# Patient Record
Sex: Female | Born: 1970
Health system: Southern US, Community
[De-identification: ages and names within clinical notes are randomized; demographics above are authoritative.]

## PROBLEM LIST (undated history)

## (undated) ENCOUNTER — Ambulatory Visit (HOSPITAL_COMMUNITY): Admission: EM | Disposition: A | Discharge status: Home / Self Care | Payer: Medicaid Other

## (undated) DIAGNOSIS — E785 Hyperlipidemia, unspecified: Secondary | ICD-10-CM

## (undated) DIAGNOSIS — I10 Essential (primary) hypertension: Secondary | ICD-10-CM

## (undated) DIAGNOSIS — D649 Anemia, unspecified: Secondary | ICD-10-CM

## (undated) DIAGNOSIS — Z9289 Personal history of other medical treatment: Secondary | ICD-10-CM

## (undated) DIAGNOSIS — T7840XA Allergy, unspecified, initial encounter: Secondary | ICD-10-CM

## (undated) HISTORY — PX: APPENDECTOMY: SHX54

## (undated) HISTORY — DX: Allergy, unspecified, initial encounter: T78.40XA

## (undated) HISTORY — PX: CHOLECYSTECTOMY OPEN: SUR202

---

## 2009-05-13 ENCOUNTER — Encounter: Admission: RE | Admit: 2009-05-13 | Discharge: 2009-05-13 | Payer: Self-pay | Admitting: Infectious Diseases

## 2010-03-14 ENCOUNTER — Emergency Department (HOSPITAL_COMMUNITY): Admission: EM | Admit: 2010-03-14 | Discharge: 2010-03-14 | Payer: Self-pay | Admitting: Family Medicine

## 2010-07-07 ENCOUNTER — Emergency Department (HOSPITAL_COMMUNITY): Admission: EM | Admit: 2010-07-07 | Discharge: 2010-07-07 | Payer: Self-pay | Admitting: Family Medicine

## 2011-01-26 LAB — POCT I-STAT, CHEM 8
Calcium, Ion: 1.21 mmol/L (ref 1.12–1.32)
Glucose, Bld: 193 mg/dL — ABNORMAL HIGH (ref 70–99)
HCT: 39 % (ref 36.0–46.0)
Hemoglobin: 13.3 g/dL (ref 12.0–15.0)
Potassium: 3.6 mEq/L (ref 3.5–5.1)
TCO2: 23 mmol/L (ref 0–100)

## 2011-03-19 ENCOUNTER — Inpatient Hospital Stay (INDEPENDENT_AMBULATORY_CARE_PROVIDER_SITE_OTHER)
Admission: RE | Admit: 2011-03-19 | Discharge: 2011-03-19 | Disposition: A | Discharge status: Home / Self Care | Payer: Self-pay | Source: Ambulatory Visit | Attending: Emergency Medicine | Admitting: Emergency Medicine

## 2011-03-19 ENCOUNTER — Ambulatory Visit (INDEPENDENT_AMBULATORY_CARE_PROVIDER_SITE_OTHER): Payer: Self-pay

## 2011-03-19 DIAGNOSIS — R05 Cough: Secondary | ICD-10-CM

## 2011-03-19 DIAGNOSIS — R059 Cough, unspecified: Secondary | ICD-10-CM

## 2011-08-23 IMAGING — CR DG CHEST 2V
2 series · 2 of 2 positions shown · non-contrast
Comparison: 05/13/2009

CLINICAL DATA: Cough

CHEST - 2 VIEW

[view not recorded (1 of 2)]
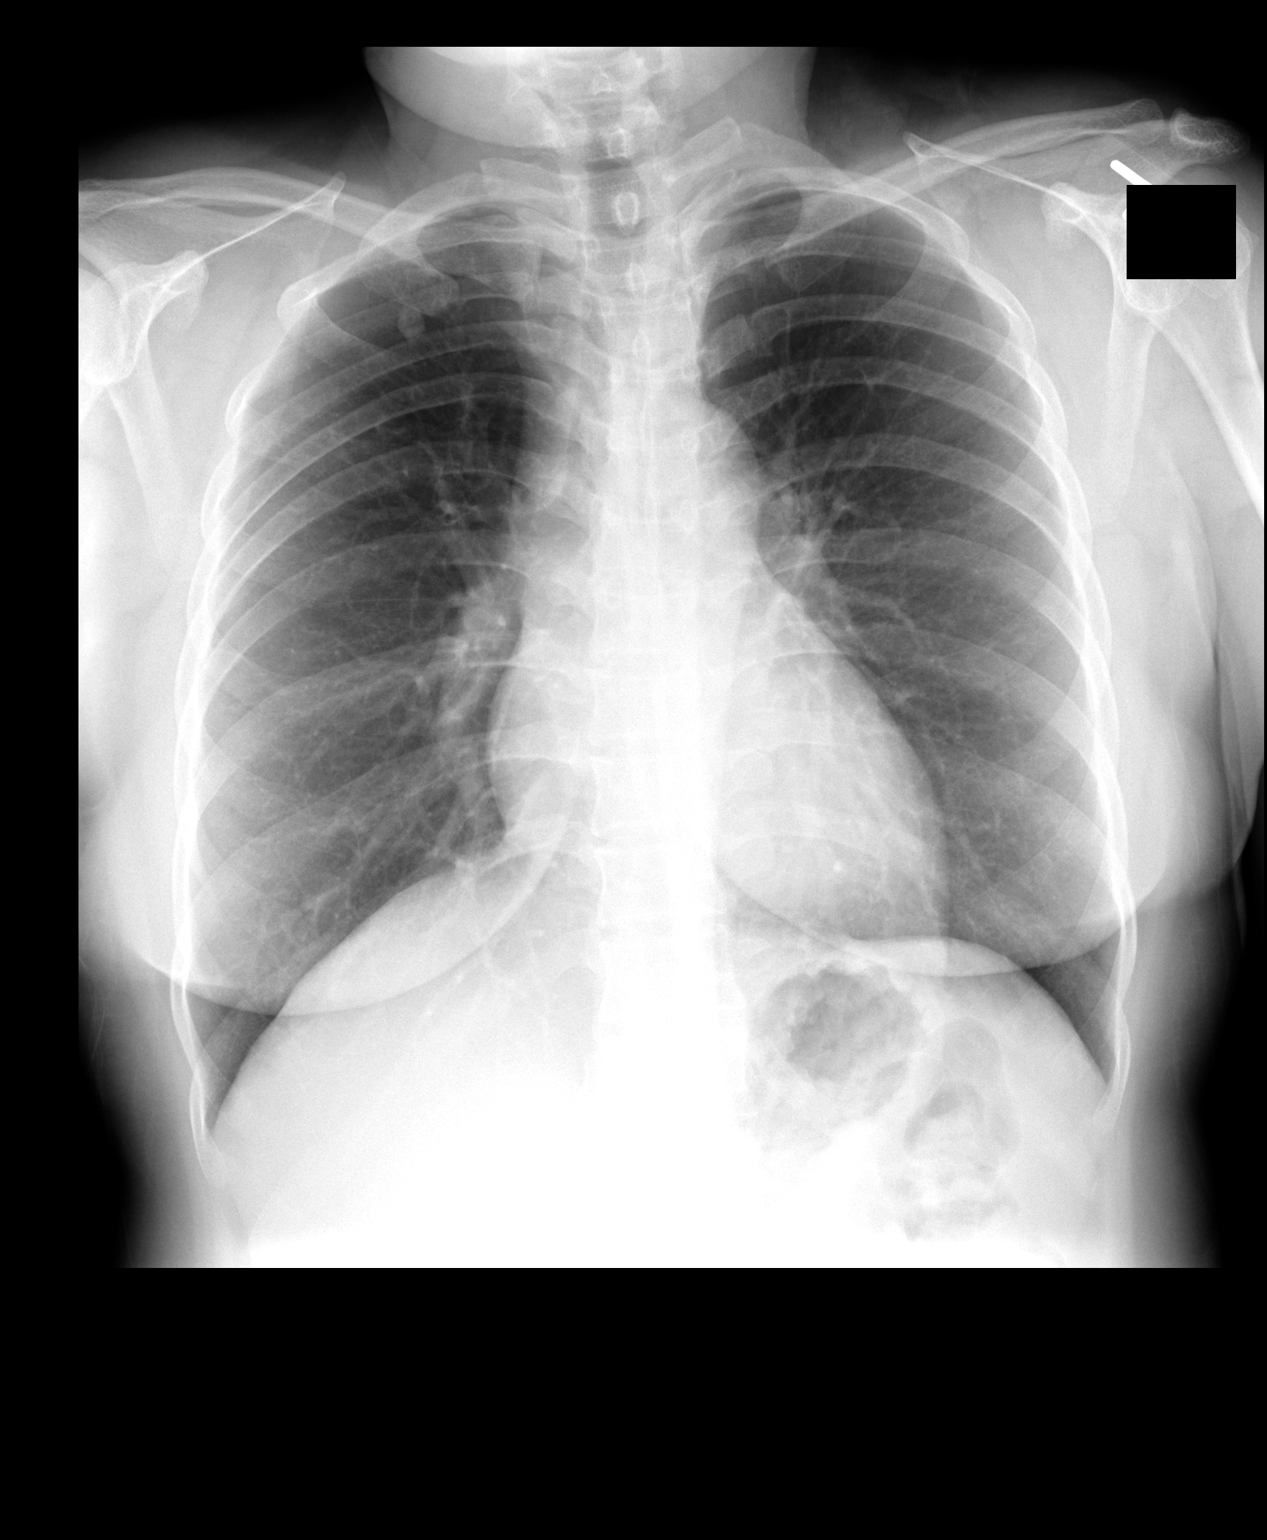

[view not recorded (2 of 2)]
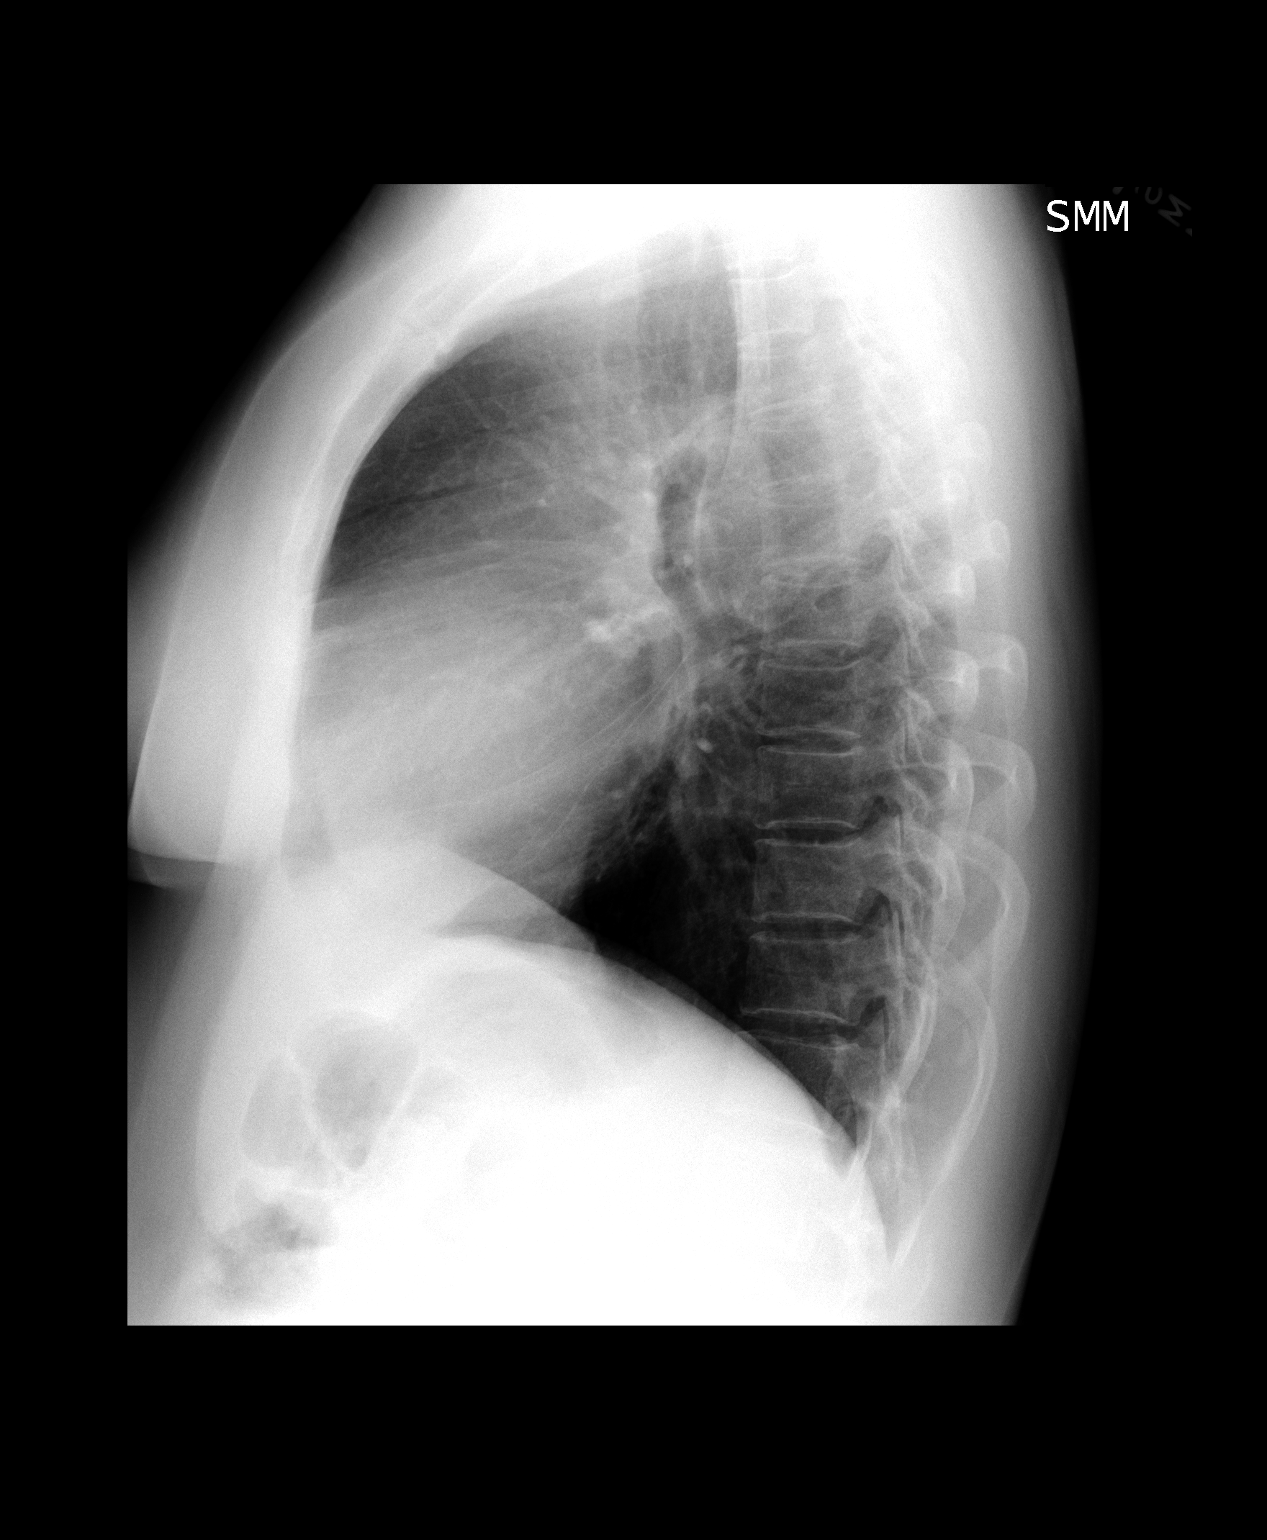

[2 of 2 positions shown; findings below may reference images not displayed]

FINDINGS: Calcified right upper lobe granuloma again noted.  Lungs
otherwise clear.  There is mild hyperaeration.

Heart and mediastinal contours normal.  No pleural or pericardial
fluid.
IMPRESSION: Right upper lobe calcified granuloma - no active disease or
interval change.

## 2011-12-19 ENCOUNTER — Emergency Department (HOSPITAL_COMMUNITY)
Admission: EM | Admit: 2011-12-19 | Discharge: 2011-12-19 | Disposition: A | Discharge status: Home Health | Payer: Self-pay | Source: Home / Self Care | Attending: Family Medicine | Admitting: Family Medicine

## 2011-12-19 ENCOUNTER — Encounter (HOSPITAL_COMMUNITY): Payer: Self-pay | Admitting: *Deleted

## 2011-12-19 DIAGNOSIS — I10 Essential (primary) hypertension: Secondary | ICD-10-CM

## 2011-12-19 HISTORY — DX: Essential (primary) hypertension: I10

## 2011-12-19 MED ORDER — LOSARTAN POTASSIUM-HCTZ 50-12.5 MG PO TABS: 1.0000 | ORAL_TABLET | Freq: Every day | ORAL | Status: DC

## 2011-12-19 NOTE — ED Provider Notes (Signed)
History     CSN: [id number]  Arrival date & time 12/19/11  1006   First MD Initiated Contact with Patient 12/19/11 1030      Chief Complaint  Patient presents with  . Medication Refill    (Consider location/radiation/quality/duration/timing/severity/associated sxs/prior treatment) HPI Comments: Alice Woodard presents for evaluation for refill of her blood pressure medication. She reports that she is a [facility] patient. She states that they cannot see her until 01/21/2012. She has been out of her blood pressure medication for more than a month. Her last visit with them was in June of 2012. She was supposed to have a followup appointment in October of 2012, but did not keep that appointment. She denies any symptoms today. She reports some mild headaches recently. No headache today. She also reports as intermittent arm and forearm numbness at night. She denies any weakness. No visual changes. No nausea or vomiting. Patient is a 41 y.o. female presenting with hypertension. The history is provided by the patient and a friend.  Hypertension This is a chronic problem. The problem occurs constantly. The problem has not changed since onset.Associated symptoms include headaches. Pertinent negatives include no chest pain, no abdominal pain and no shortness of breath. The symptoms are aggravated by nothing. The symptoms are relieved by nothing.    Past Medical History  Diagnosis Date  . Hypertension     History reviewed. No pertinent past surgical history.  No family history on file.  History  Substance Use Topics  . Smoking status: Never Smoker   . Smokeless tobacco: Not on file  . Alcohol Use: No    OB History    Grav Para Term Preterm Abortions TAB SAB Ect Mult Living                  Review of Systems  Constitutional: Negative.   Eyes: Negative.   Respiratory: Negative.  Negative for shortness of breath.   Cardiovascular: Negative.  Negative for chest pain.  Gastrointestinal:  Negative.  Negative for abdominal pain.  Genitourinary: Negative.   Musculoskeletal: Negative.   Skin: Negative.   Neurological: Positive for numbness and headaches. Negative for dizziness and weakness.    Allergies  Review of patient's allergies indicates no known allergies.  Home Medications   Current Outpatient Rx  Name Route Sig Dispense Refill  . LOSARTAN POTASSIUM-HCTZ 50-12.5 MG PO TABS Oral Take 1 tablet by mouth daily. 30 tablet 1    BP 169/107  Pulse 68  Temp(Src) 98.9 F (37.2 C) (Oral)  Resp 16  SpO2 100%  LMP 11/17/2011  Physical Exam  Nursing note and vitals reviewed. Constitutional: She is oriented to person, place, and time. She appears well-developed and well-nourished.  HENT:  Head: Normocephalic and atraumatic.  Right Ear: Tympanic membrane normal.  Left Ear: Tympanic membrane normal.  Mouth/Throat: Uvula is midline, oropharynx is clear and moist and mucous membranes are normal.  Eyes: Conjunctivae and EOM are normal. Pupils are equal, round, and reactive to light.  Neck: Normal range of motion.  Cardiovascular: Normal rate, regular rhythm and normal heart sounds.   No murmur heard. Pulmonary/Chest: Effort normal and breath sounds normal. She has no wheezes. She has no rhonchi.  Musculoskeletal: Normal range of motion.  Neurological: She is alert and oriented to person, place, and time. She has normal strength. No cranial nerve deficit or sensory deficit.  Skin: Skin is warm and dry.  Psychiatric: Her behavior is normal.    ED Course  Procedures (including critical  care time)  Labs Reviewed - No data to display No results found.   1. Hypertension       MDM  Refilled HTN medication; referred back to PCP        Richardo Priest, MD 12/19/11 1159

## 2011-12-19 NOTE — ED Notes (Signed)
Pt has been out of her BP med for a month.  Next appt with [facility] is March 11. 2013.  C/o headache and general not feeling well.

## 2012-08-01 ENCOUNTER — Emergency Department (INDEPENDENT_AMBULATORY_CARE_PROVIDER_SITE_OTHER)
Admission: EM | Admit: 2012-08-01 | Discharge: 2012-08-01 | Disposition: A | Discharge status: Home / Self Care | Payer: Self-pay | Source: Home / Self Care | Attending: Emergency Medicine | Admitting: Emergency Medicine

## 2012-08-01 ENCOUNTER — Encounter (HOSPITAL_COMMUNITY): Payer: Self-pay

## 2012-08-01 DIAGNOSIS — G510 Bell's palsy: Secondary | ICD-10-CM

## 2012-08-01 MED ORDER — POLYETHYL GLYCOL-PROPYL GLYCOL 0.4-0.3 % OP SOLN: OPHTHALMIC | Status: DC

## 2012-08-01 MED ORDER — ACYCLOVIR 400 MG PO TABS: 400.0000 mg | ORAL_TABLET | Freq: Four times a day (QID) | ORAL | Status: DC

## 2012-08-01 MED ORDER — PREDNISONE 5 MG PO KIT: 1.0000 | PACK | Freq: Every day | ORAL | Status: DC

## 2012-08-01 MED ORDER — POLYETHYL GLYCOL-PROPYL GLYCOL 0.4-0.3 % OP GEL: OPHTHALMIC | Status: DC

## 2012-08-01 NOTE — ED Provider Notes (Signed)
Chief Complaint  Patient presents with  . Facial Droop  . Eye Problem    History of Present Illness:   The patient is a 41 year-old [place] female who presents today with a one-day history of drooping of the right eye and right side of the face. She is accompanied by an agency interpreter. The symptoms were present this morning when she first woke up. She notes the right upper eyelid to be drooping, but she is able to close the eye completely. She denies any dryness or pain of the eye or diplopia or blurred vision. Also she's had a little bit of drooping of the right corner of the mouth as well. She notices this when she tries to chew or to eat. She denies any facial pain, numbness, or tingling. She has no pain behind or in the right ear. She does not have any problem with the left side of the face except for a little bit of twitching of the left eyelid. She denies any headache or fever. She's had no difficulty swallowing. She notes no dryness of the mouth or trouble with speech. She's had no trouble hearing. There is no weakness, numbness or tingling of the arms or legs.  Review of Systems:  Other than noted above, the patient denies any of the following symptoms: Systemic:  No fever, chills, fatigue, photophobia, stiff neck. Eye:  No redness, eye pain, discharge, blurred vision, or diplopia. ENT:  No nasal congestion, rhinorrhea, sinus pressure or pain, sneezing, earache, or sore throat.  No jaw claudication. Neuro:  No paresthesias, loss of consciousness, seizure activity, muscle weakness, trouble with coordination or gait, trouble speaking or swallowing. Psych:  No depression, anxiety or trouble sleeping.  PMFSH:  Past medical history, family history, social history, meds, and allergies were reviewed.  Physical Exam:   Vital signs:  BP 134/75  Pulse 66  Temp 98 F (36.7 C) (Oral)  Resp 20  SpO2 99%  LMP 07/22/2012 General:  Alert and oriented.  In no distress. Eye:  Lids and conjunctivas  normal.  PERRL,  Full EOMs.  Fundi benign with normal discs and vessels. ENT:  No cranial or facial tenderness to palpation.  TMs and canals clear.  Nasal mucosa was normal and uncongested without any drainage. No intra oral lesions, pharynx clear, mucous membranes moist, dentition normal. Neck:  Supple, full ROM, no tenderness to palpation.  No adenopathy or mass. No carotid bruit. Lungs: Clear to auscultation. Heart: Regular rhythm, no gallop or murmur. Neuro:  Alert and orented times 3.  Speech was clear, fluent, and appropriate.  Cranial nerve exam reveals a mild weakness of the right seventh nerve. She is able to close her eye completely, but there is some drooping of the eye lid. She has difficulty with whistling. There is no weakness of the forehead muscles No pronator drift, muscle strength normal. Finger to nose normal.  DTRs were 2+ and symmetrical.Station and gait were normal.  Romberg's sign was normal.  Able to perform tandem gait well. Psych:  Normal affect.  Assessment:  The encounter diagnosis was Bell's palsy.  This is very mild and probably in the early phases. Hopefully if she starts the medications right away, the symptoms will be minimal. I told her this would probably get worse before it gets better. She has a orange card, so she has nowhere to followup with except for here. I told her to return here in a week. I emphasized the importance of avoiding dryness to the  eye and suggested taping the eye at nighttime. She was given propyl glycol eyedrops for use during the day and gel for at night.  Plan:   1.  The following meds were prescribed:   New Prescriptions   ACYCLOVIR (ZOVIRAX) 400 MG TABLET    Take 1 tablet (400 mg total) by mouth 4 (four) times daily.   POLYETHYL GLYCOL-PROPYL GLYCOL (SYSTANE) 0.4-0.3 % GEL    Apply to right eye at bedtime.   POLYETHYL GLYCOL-PROPYL GLYCOL (SYSTANE) 0.4-0.3 % SOLN    Apply 1 drop to right eye every 3 hours while awake.   PREDNISONE 5 MG  KIT    Take 1 kit (5 mg total) by mouth daily after breakfast. Prednisone 5 mg 6 day dosepack.  Take as directed.   2.  The patient was instructed in symptomatic care and handouts were given. 3.  The patient was told to return if becoming worse in any way, if no better in 3 or 4 days, and given some red flag symptoms that would indicate earlier return.    Reuben Likes, MD 08/01/12 [phone]

## 2012-08-01 NOTE — ED Notes (Signed)
C/o feeling like rt eye is weak, drooping to rt side of face this am.  Also states lt eye is twitching.  Denies any weakness to extremities, no change in speech, affect or gait.  Pt is Nepali- case worker is interpreting for pt.  States pt is normal self.  Pt denies any pain.

## 2012-08-07 ENCOUNTER — Emergency Department (INDEPENDENT_AMBULATORY_CARE_PROVIDER_SITE_OTHER)
Admission: EM | Admit: 2012-08-07 | Discharge: 2012-08-07 | Disposition: A | Discharge status: Home / Self Care | Payer: Self-pay | Source: Home / Self Care | Attending: Emergency Medicine | Admitting: Emergency Medicine

## 2012-08-07 ENCOUNTER — Encounter (HOSPITAL_COMMUNITY): Payer: Self-pay

## 2012-08-07 DIAGNOSIS — G510 Bell's palsy: Secondary | ICD-10-CM

## 2012-08-07 NOTE — ED Notes (Signed)
Patient was seen last Friday and diagnosed Bells Palsey states that she out of one med prescribed and does not feel much better

## 2012-08-07 NOTE — ED Provider Notes (Signed)
Chief Complaint  Patient presents with  . Follow-up    History of Present Illness:   The patient is a 41 year old female who was seen here week ago with Bell's palsy with some drooping of her right eyelid. She had really very minimal symptoms at that time. I initially thought that this was a weakness on the right side. Her symptoms have gotten minimally worse since then. At this point it seems that the weakness is on the left side. She is able to close her right eye and not completely close her left eye. There is no weakness of her forehead or of her mouth. She denies any facial pain, numbness, or tingling. She denies any visual symptoms or pain in or behind the ear. She does note some generalized tremulousness, but this may be due to the medication. She just about to finish up her medication.  Review of Systems:  Other than noted above, the patient denies any of the following symptoms: Systemic:  No fever, chills, fatigue, photophobia, stiff neck. Eye:  No redness, eye pain, discharge, blurred vision, or diplopia. ENT:  No nasal congestion, rhinorrhea, sinus pressure or pain, sneezing, earache, or sore throat.  No jaw claudication. Neuro:  No paresthesias, loss of consciousness, seizure activity, muscle weakness, trouble with coordination or gait, trouble speaking or swallowing. Psych:  No depression, anxiety or trouble sleeping.  PMFSH:  Past medical history, family history, social history, meds, and allergies were reviewed.  Physical Exam:   Vital signs:  BP 131/90  Pulse 74  Temp 97.6 F (36.4 C) (Oral)  Resp 16  SpO2 99%  LMP 07/22/2012 General:  Alert and oriented.  In no distress. Eye:  Lids and conjunctivas normal.  PERRL,  Full EOMs.  Fundi benign with normal discs and vessels. ENT:  No cranial or facial tenderness to palpation.  TMs and canals clear.  Nasal mucosa was normal and uncongested without any drainage. No intra oral lesions, pharynx clear, mucous membranes moist,  dentition normal. Neck:  Supple, full ROM, no tenderness to palpation.  No adenopathy or mass. No carotid bruit. Lungs: Clear to auscultation. Heart: Regular rhythm, no gallop or murmur. Neuro:  Alert and orented times 3.  Speech was clear, fluent, and appropriate.  Cranial nerve exam reveals a very minimal left peripheral nerve palsy. I originally thought that this was a right-sided weakness, but I think actually was a weakness on the left side. She doesn't have any weakness of her forehead muscles or of her perioral muscles. It only seems to involve the periorbital muscles on the left. She's not able to completely close her eye on that side. No pronator drift, muscle strength normal. Finger to nose normal.  DTRs were 2+ and symmetrical.Station and gait were normal.  Romberg's sign was normal.  Able to perform tandem gait well. Psych:  Normal affect.  Assessment:  The encounter diagnosis was Bell's palsy.  I suggested that she finish up her medications and continue to use the eyedrops in the left eye. This should clear up fairly quickly since is only a minimal weakness. I suggested she return if she has any further problems.  Plan:   1.  The following meds were prescribed:   New Prescriptions   No medications on file   2.  The patient was instructed in symptomatic care and handouts were given. 3.  The patient was told to return if becoming worse in any way, if no better in 3 or 4 days, and given some red  flag symptoms that would indicate earlier return.    Reuben Likes, MD 08/07/12 [phone]

## 2012-10-20 ENCOUNTER — Emergency Department (INDEPENDENT_AMBULATORY_CARE_PROVIDER_SITE_OTHER)
Admission: EM | Admit: 2012-10-20 | Discharge: 2012-10-20 | Disposition: A | Discharge status: Home / Self Care | Payer: No Typology Code available for payment source | Source: Home / Self Care

## 2012-10-20 ENCOUNTER — Encounter (HOSPITAL_COMMUNITY): Payer: Self-pay

## 2012-10-20 DIAGNOSIS — I10 Essential (primary) hypertension: Secondary | ICD-10-CM

## 2012-10-20 LAB — COMPREHENSIVE METABOLIC PANEL
AST: 17 U/L (ref 0–37)
Albumin: 3.9 g/dL (ref 3.5–5.2)
Calcium: 9.7 mg/dL (ref 8.4–10.5)
Chloride: 102 mEq/L (ref 96–112)
Creatinine, Ser: 0.74 mg/dL (ref 0.50–1.10)
GFR calc Af Amer: >90 mL/min (ref >90)
GFR calc non Af Amer: >90 mL/min (ref >90)
Total Bilirubin: 0.4 mg/dL (ref 0.3–1.2)
Total Protein: 8 g/dL (ref 6.0–8.3)

## 2012-10-20 MED ORDER — LOSARTAN POTASSIUM-HCTZ 50-12.5 MG PO TABS: 1.0000 | ORAL_TABLET | Freq: Every day | ORAL | Status: DC

## 2012-10-20 MED ORDER — POLYETHYL GLYCOL-PROPYL GLYCOL 0.4-0.3 % OP GEL: OPHTHALMIC | Status: DC

## 2012-10-20 NOTE — ED Notes (Signed)
Medication refill

## 2012-10-20 NOTE — ED Provider Notes (Signed)
History     CSN: [id number]  Arrival date & time 10/20/12  1527  Chief Complaint  Patient presents with  . Medication Refill    The history is provided by the patient. The history is limited by a language barrier. A language interpreter was used.   Pt presents today for refill of blood pressure medications.  The patient says that she has been feeling well     Past Medical History  Diagnosis Date  . Hypertension     Past Surgical History  Procedure Date  . Abdominal surgery     No family history on file.  History  Substance Use Topics  . Smoking status: Never Smoker   . Smokeless tobacco: Not on file  . Alcohol Use: No    OB History    Grav Para Term Preterm Abortions TAB SAB Ect Mult Living                  Review of Systems  Constitutional: Negative.   HENT: Negative.   Eyes: Negative.   Respiratory: Negative.   Cardiovascular: Negative.   Gastrointestinal: Negative.   Musculoskeletal: Negative.   Neurological: Negative.   Hematological: Negative.   Psychiatric/Behavioral: Negative.     Allergies  Review of patient's allergies indicates no known allergies.  Home Medications   Current Outpatient Rx  Name  Route  Sig  Dispense  Refill  . LOSARTAN POTASSIUM-HCTZ 50-12.5 MG PO TABS   Oral   Take 1 tablet by mouth daily.   30 tablet   1   . ACYCLOVIR 400 MG PO TABS   Oral   Take 1 tablet (400 mg total) by mouth 4 (four) times daily.   50 tablet   0   . POLYETHYL GLYCOL-PROPYL GLYCOL 0.4-0.3 % OP GEL      Apply to right eye at bedtime.   10 mL   0   . POLYETHYL GLYCOL-PROPYL GLYCOL 0.4-0.3 % OP SOLN      Apply 1 drop to right eye every 3 hours while awake.   10 mL   0   . PREDNISONE 5 MG PO KIT   Oral   Take 1 kit (5 mg total) by mouth daily after breakfast. Prednisone 5 mg 6 day dosepack.  Take as directed.   1 kit   0     Pulse 63  Temp 98.4 F (36.9 C) (Oral)  Resp 19  SpO2 100%  Physical Exam  Nursing note and vitals  reviewed. Constitutional: She is oriented to person, place, and time. She appears well-developed and well-nourished. No distress.  HENT:  Head: Normocephalic and atraumatic.  Eyes: EOM are normal. Pupils are equal, round, and reactive to light.  Neck: Normal range of motion. Neck supple. No thyromegaly present.  Cardiovascular: Normal rate, regular rhythm and normal heart sounds.   Pulmonary/Chest: Effort normal.  Abdominal: Soft. Bowel sounds are normal.  Musculoskeletal: Normal range of motion. She exhibits no edema.  Neurological: She is alert and oriented to person, place, and time.  Skin: Skin is warm and dry.  Psychiatric: She has a normal mood and affect. Her behavior is normal. Judgment and thought content normal.    ED Course  Procedures (including critical care time)  Labs Reviewed - No data to display No results found.   No diagnosis found.    MDM  IMPRESSION  Hypertension  RECOMMENDATIONS / PLAN  Pt declined flu vaccine Refilled blood pressure medications Check CMP today  FOLLOW UP 3  months for recheck  The patient was given clear instructions to go to ER or return to medical center if symptoms don't improve, worsen or new problems develop.  The patient verbalized understanding.  The patient was told to call to get lab results if they haven't heard anything in the next week.           Cleora Fleet, MD 10/20/12 1815

## 2012-10-22 ENCOUNTER — Telehealth (HOSPITAL_COMMUNITY): Payer: Self-pay

## 2012-10-22 NOTE — Telephone Encounter (Signed)
Message copied by Lestine Mount on Wed Oct 22, 2012  1:13 PM ------      Message from: Cleora Fleet      Created: Tue Oct 21, 2012  8:27 PM      Regarding: Please call in prescription       Please call in prescription for patient to take KCL 10 meq, take 1 po daily, #14, no refills            Rodney Langton, MD, CDE, FAAFP      [facility]      [facility]      [place], [place]

## 2012-12-26 ENCOUNTER — Encounter (HOSPITAL_COMMUNITY): Payer: Self-pay

## 2012-12-26 ENCOUNTER — Emergency Department (HOSPITAL_COMMUNITY)
Admission: EM | Admit: 2012-12-26 | Discharge: 2012-12-26 | Disposition: A | Discharge status: Home / Self Care | Payer: No Typology Code available for payment source | Source: Home / Self Care

## 2012-12-26 DIAGNOSIS — Z299 Encounter for prophylactic measures, unspecified: Secondary | ICD-10-CM

## 2012-12-26 DIAGNOSIS — I10 Essential (primary) hypertension: Secondary | ICD-10-CM

## 2012-12-26 MED ORDER — LOSARTAN POTASSIUM-HCTZ 50-12.5 MG PO TABS: 1.0000 | ORAL_TABLET | Freq: Every day | ORAL | Status: DC

## 2012-12-26 NOTE — ED Notes (Signed)
Patient has  A history of HTN-needs medication refill

## 2012-12-26 NOTE — ED Provider Notes (Addendum)
History     CSN: [id number]  Arrival date & time 12/26/12  1310   First MD Initiated Contact with Patient 12/26/12 1428      Chief Complaint  Patient presents with  . Medication Refill    (Consider location/radiation/quality/duration/timing/severity/associated sxs/prior treatment) HPI Patient is today for medication refill.  She complains of palpitations on and off which started 2-3 months. She has it 1-2 times a month. She does not have any exacerbating factors or relieving factors. She does have increased hair fall. Denies excessive cold or hot. No constipation or diarrhea noted.  No other complaints.  BP is high today.   Past Medical History  Diagnosis Date  . Hypertension     Past Surgical History  Procedure Laterality Date  . Abdominal surgery      No family history on file.  History  Substance Use Topics  . Smoking status: Never Smoker   . Smokeless tobacco: Not on file  . Alcohol Use: No    OB History   Grav Para Term Preterm Abortions TAB SAB Ect Mult Living                  Review of Systems  Allergies  Review of patient's allergies indicates no known allergies.  Home Medications   Current Outpatient Rx  Name  Route  Sig  Dispense  Refill  . losartan-hydrochlorothiazide (HYZAAR) 50-12.5 MG per tablet   Oral   Take 1 tablet by mouth daily.   30 tablet   3   . Polyethyl Glycol-Propyl Glycol (SYSTANE) 0.4-0.3 % GEL      Apply to right eye at bedtime.   10 mL   0     BP 146/90  Pulse 66  Temp(Src) 97.7 F (36.5 C) (Oral)  SpO2 100%  Physical Exam Physical Exam: General: Vital signs reviewed and noted. Well-developed, well-nourished, in no acute distress; alert, appropriate and cooperative throughout examination.  Head: Normocephalic, atraumatic.  Eyes: PERRL, EOMI, No signs of anemia or jaundince.  Nose: Mucous membranes moist, not inflammed, nonerythematous.  Throat: Oropharynx nonerythematous, no exudate appreciated.   Neck:  No deformities, masses, or tenderness noted.Supple, No carotid Bruits, no JVD.  Lungs:  Normal respiratory effort. Clear to auscultation BL without crackles or wheezes.  Heart: RRR. S1 and S2 normal without gallop, murmur, or rubs.  Abdomen:  BS normoactive. Soft, Nondistended, non-tender.  No masses or organomegaly.  Extremities: No pretibial edema.  Neurologic: A&O X3, CN II - XII are grossly intact. Motor strength is 5/5 in the all 4 extremities, Sensations intact to light touch, Cerebellar signs negative.  Skin: No visible rashes, scars.     ED Course  Procedures (including critical care time)  Labs Reviewed - No data to display No results found.   No diagnosis found.    MDM  1. Patient has not had pap smear done. [facility]. 2. Refill BP medications. 3. No change in medications at this time and follow up in 3-6 months. 4. Check TSH, 12 lead EKG to rule out hypo/hyperthyroidism and rhythm abnormalities.    Lars Mage, MD 12/26/12 1439  Lars Mage, MD 12/26/12 [phone]

## 2013-01-06 ENCOUNTER — Encounter: Payer: Self-pay | Admitting: Internal Medicine

## 2013-05-18 ENCOUNTER — Emergency Department (HOSPITAL_COMMUNITY)
Admission: EM | Admit: 2013-05-18 | Discharge: 2013-05-18 | Disposition: A | Discharge status: Home / Self Care | Payer: No Typology Code available for payment source | Attending: Emergency Medicine | Admitting: Emergency Medicine

## 2013-05-18 ENCOUNTER — Encounter (HOSPITAL_COMMUNITY): Payer: Self-pay | Admitting: Emergency Medicine

## 2013-05-18 ENCOUNTER — Ambulatory Visit: Payer: No Typology Code available for payment source

## 2013-05-18 DIAGNOSIS — I10 Essential (primary) hypertension: Secondary | ICD-10-CM | POA: Insufficient documentation

## 2013-05-18 DIAGNOSIS — Z8669 Personal history of other diseases of the nervous system and sense organs: Secondary | ICD-10-CM | POA: Insufficient documentation

## 2013-05-18 DIAGNOSIS — R002 Palpitations: Secondary | ICD-10-CM | POA: Insufficient documentation

## 2013-05-18 DIAGNOSIS — Z79899 Other long term (current) drug therapy: Secondary | ICD-10-CM | POA: Insufficient documentation

## 2013-05-18 LAB — BASIC METABOLIC PANEL
BUN: 15 mg/dL (ref 6–23)
CO2: 23 mEq/L (ref 19–32)
Calcium: 9.5 mg/dL (ref 8.4–10.5)
Creatinine, Ser: 0.82 mg/dL (ref 0.50–1.10)
Glucose, Bld: 95 mg/dL (ref 70–99)

## 2013-05-18 MED ORDER — CLONIDINE HCL 0.1 MG PO TABS
0.1000 mg | ORAL_TABLET | Freq: Once | ORAL | Status: AC
  Administered 2013-05-18: 0.1 mg via ORAL
  Filled 2013-05-18: qty 1

## 2013-05-18 MED ORDER — LOSARTAN POTASSIUM-HCTZ 50-12.5 MG PO TABS: 1.0000 | ORAL_TABLET | Freq: Every day | ORAL | Status: DC

## 2013-05-18 NOTE — ED Provider Notes (Signed)
History    CSN: [id number] Arrival date & time 05/18/13  0905  First MD Initiated Contact with Patient 05/18/13 [phone]     Chief Complaint  Patient presents with  . Hypertension    HPI  Pt history limited due to language barrier, use translation service   Pt is a 42 yo F with pmh of HTN and Bell's Palsy who presents with hypertension and with no other complaints who needs her bp medication refilled. She reports no HA, blurry vision, CP, SOB, diaphoresis, palpitations, lightheadedness, leg swelling,  epistaxis, nausea, vomiting, abdominal pain or changes in urination.  Per pt  She has been complacent with taking her bp medication, HCTZ/losartan 50-12.5 1 tab daily, but needs medication refilled. Still has pills in current bottle. Pt does not usually check blood pressure at home but this morning had her blood pressure checked(?) and was told to come in. She reports she was resting at home when blood pressure was taken.  Initial bp 171/99 on arrival to ED with later drop to to 146/86 after resting in bed.           Past Medical History  Diagnosis Date  . Hypertension    Past Surgical History  Procedure Laterality Date  . Abdominal surgery     No family history on file. History  Substance Use Topics  . Smoking status: Never Smoker   . Smokeless tobacco: Not on file  . Alcohol Use: No   OB History   Grav Para Term Preterm Abortions TAB SAB Ect Mult Living                 Review of Systems  Constitutional: Negative.   HENT: Negative.   Eyes: Negative.   Cardiovascular: Positive for palpitations (at baseline). Negative for chest pain and leg swelling.  Gastrointestinal: Negative.   Endocrine: Negative.   Genitourinary: Negative.   Musculoskeletal: Negative.   Skin: Negative.   Allergic/Immunologic: Negative.   Neurological: Negative.   Hematological: Negative.     Allergies  Review of patient's allergies indicates no known allergies.  Home Medications   Current  Outpatient Rx  Name  Route  Sig  Dispense  Refill  . losartan-hydrochlorothiazide (HYZAAR) 50-12.5 MG per tablet   Oral   Take 1 tablet by mouth daily.   90 tablet   1    BP 146/86  Pulse 57  Temp(Src) 98.6 F (37 C) (Oral)  Resp 16  SpO2 99% Physical Exam  Constitutional: She is oriented to person, place, and time. She appears well-developed and well-nourished. No distress.  HENT:  Head: Normocephalic and atraumatic.  Eyes: EOM are normal. Pupils are equal, round, and reactive to light.  Neck: Normal range of motion. Neck supple.  Cardiovascular: Normal rate, regular rhythm and normal heart sounds.      Pulmonary/Chest: Effort normal and breath sounds normal. No respiratory distress.  Abdominal: Soft. Bowel sounds are normal. She exhibits no distension. There is no tenderness. There is no rebound and no guarding.  Musculoskeletal: Normal range of motion. She exhibits no edema.  Neurological: She is alert and oriented to person, place, and time.  Skin: Skin is warm and dry. No rash noted. She is not diaphoretic. No erythema. No pallor.  Psychiatric: She has a normal mood and affect.    ED Course  Procedures (including critical care time) Labs Reviewed  BASIC METABOLIC PANEL - Abnormal; Notable for the following:    GFR calc non Af Amer 86 (*)  All other components within normal limits   No results found. No diagnosis found.  MDM  Assessment:  42 yo F with pmh of HTN on losartan-HCTZ  who presents with asymptomatic hypertension and needs bp medication refilled.    Plan:   Asymptomatic Hypertension - fluctuating 171/99 initially to 149/86 to 156/92   -Obtain CMP to check Na/K and renal function ---> WNL  -Administer Clonidine 0.1mg  for stage 2 hypertension  -Refill prescription for losartan/HCTZ 50-12.5mg  1 tab daily       Disposition: home ---asymptomatic HTN, bp improved  with clonidine , normal BMP, will continue taking current bp medication and f/u with PCP         Otis Brace, MD 05/18/13 1405

## 2013-05-18 NOTE — ED Provider Notes (Signed)
I saw and evaluated the patient, reviewed the resident's note and I agree with the findings and plan. Patient was asymptomatic hypertension. Will refill her hypertensive medications and she will need to followup with her doctor. This was discussed with her by use of the interpreter phone  Toy Baker, MD 05/18/13 1049

## 2013-05-18 NOTE — ED Notes (Addendum)
Just needs bp checked  No h/a no abd pain no vomiting or diarrhea  Has her meds filled 05/04/13

## 2013-05-18 NOTE — ED Notes (Signed)
Family at bedside. 

## 2013-05-19 NOTE — ED Provider Notes (Signed)
I saw and evaluated the patient, reviewed the resident's note and I agree with the findings and plan.  Toy Baker, MD 05/19/13 [id number]

## 2013-10-02 ENCOUNTER — Encounter (HOSPITAL_COMMUNITY): Payer: Self-pay | Admitting: Emergency Medicine

## 2013-10-02 ENCOUNTER — Emergency Department (HOSPITAL_COMMUNITY)
Admission: EM | Admit: 2013-10-02 | Discharge: 2013-10-02 | Disposition: A | Discharge status: Home / Self Care | Payer: BC Managed Care – PPO | Source: Home / Self Care | Attending: Family Medicine | Admitting: Family Medicine

## 2013-10-02 DIAGNOSIS — I10 Essential (primary) hypertension: Secondary | ICD-10-CM

## 2013-10-02 LAB — POCT I-STAT, CHEM 8
Calcium, Ion: 1.24 mmol/L — ABNORMAL HIGH (ref 1.12–1.23)
Creatinine, Ser: 0.9 mg/dL (ref 0.50–1.10)
Glucose, Bld: 81 mg/dL (ref 70–99)
Hemoglobin: 11.2 g/dL — ABNORMAL LOW (ref 12.0–15.0)
Potassium: 3 mEq/L — ABNORMAL LOW (ref 3.5–5.1)
TCO2: 24 mmol/L (ref 0–100)

## 2013-10-02 MED ORDER — LOSARTAN POTASSIUM-HCTZ 50-12.5 MG PO TABS: 1.0000 | ORAL_TABLET | Freq: Every day | ORAL | Status: DC

## 2013-10-02 NOTE — ED Provider Notes (Signed)
CSN: [id number]     Arrival date & time 10/02/13  2952 History   First MD Initiated Contact with Patient 10/02/13 1023     Chief Complaint  Patient presents with  . Medication Refill   (Consider location/radiation/quality/duration/timing/severity/associated sxs/prior Treatment) HPI Comments: 42 year old female presents requesting refill of her antihypertensive medications. She has an appointment for one month from now but she ran out. She was told to come here if she needed a refill before then. She denies any current complaints. She has been on this medication without changing the dose for approximately 4 years now.   Past Medical History  Diagnosis Date  . Hypertension    Past Surgical History  Procedure Laterality Date  . Abdominal surgery     No family history on file. History  Substance Use Topics  . Smoking status: Never Smoker   . Smokeless tobacco: Not on file  . Alcohol Use: No   OB History   Grav Para Term Preterm Abortions TAB SAB Ect Mult Living                 Review of Systems  Constitutional: Negative for fever and chills.  Eyes: Negative for visual disturbance.  Respiratory: Negative for cough and shortness of breath.   Cardiovascular: Negative for chest pain, palpitations and leg swelling.  Gastrointestinal: Negative for nausea, vomiting and abdominal pain.  Endocrine: Negative for polydipsia and polyuria.  Genitourinary: Negative for dysuria, urgency and frequency.  Musculoskeletal: Negative for arthralgias and myalgias.  Skin: Negative for rash.  Neurological: Negative for dizziness, weakness and light-headedness.    Allergies  Review of patient's allergies indicates no known allergies.  Home Medications   Current Outpatient Rx  Name  Route  Sig  Dispense  Refill  . losartan-hydrochlorothiazide (HYZAAR) 50-12.5 MG per tablet   Oral   Take 1 tablet by mouth daily.   90 tablet   1   . losartan-hydrochlorothiazide (HYZAAR) 50-12.5 MG per  tablet   Oral   Take 1 tablet by mouth daily.   60 tablet   0    BP 154/83  Pulse 62  Temp(Src) 98.3 F (36.8 C) (Oral)  Resp 16  SpO2 99% Physical Exam  Nursing note and vitals reviewed. Constitutional: She is oriented to person, place, and time. Vital signs are normal. She appears well-developed and well-nourished. No distress.  HENT:  Head: Normocephalic and atraumatic.  Cardiovascular: Normal rate, regular rhythm and normal heart sounds.   Pulmonary/Chest: Effort normal and breath sounds normal. No respiratory distress.  Abdominal: Soft. There is no tenderness.  Neurological: She is alert and oriented to person, place, and time. She has normal strength. Coordination normal.  Skin: Skin is warm and dry. No rash noted. She is not diaphoretic.  Psychiatric: She has a normal mood and affect. Judgment normal.    ED Course  Procedures (including critical care time) Labs Review Labs Reviewed  POCT I-STAT, CHEM 8 - Abnormal; Notable for the following:    Potassium 3.0 (*)    Calcium, Ion 1.24 (*)    Hemoglobin 11.2 (*)    HCT 33.0 (*)    All other components within normal limits   Imaging Review No results found.    MDM   1. Hypertension    Medication refill. Followup with primary care physician as scheduled. Recommend daily potassium supplement.    Meds ordered this encounter  Medications  . losartan-hydrochlorothiazide (HYZAAR) 50-12.5 MG per tablet    Sig: Take 1 tablet by  mouth daily.    Dispense:  60 tablet    Refill:  0    Order Specific Question:  Supervising Provider    Answer:  Bradd Canary D [[id number]]       Graylon Good, PA-C 10/02/13 1054

## 2013-10-02 NOTE — ED Provider Notes (Signed)
Medical screening examination/treatment/procedure(s) were performed by resident physician or non-physician practitioner and as supervising physician I was immediately available for consultation/collaboration.   Nadine Ryle DOUGLAS MD.   Venida Tsukamoto D Chieko Neises, MD 10/02/13 1703 

## 2013-10-02 NOTE — ED Notes (Signed)
Via 42 y/o daughter (interpreter) Pt is needing refill on her Losartan/HCTZ Has appt w/PCP on 12/23 at [facility] but only has one pill left Denies: CP, SOB, weakness, nauseas She is alert w/no signs of acute distress.

## 2013-11-03 ENCOUNTER — Encounter: Payer: Self-pay | Admitting: Internal Medicine

## 2013-11-03 ENCOUNTER — Ambulatory Visit: Payer: BC Managed Care – PPO | Attending: Internal Medicine | Admitting: Internal Medicine

## 2013-11-03 VITALS — BP 151/98 | HR 59 | Temp 98.0°F | Resp 16 | Ht <= 58 in | Wt 127.0 lb

## 2013-11-03 DIAGNOSIS — I10 Essential (primary) hypertension: Secondary | ICD-10-CM | POA: Insufficient documentation

## 2013-11-03 LAB — LIPID PANEL
Cholesterol: 218 mg/dL — ABNORMAL HIGH (ref 0–200)
HDL: 43 mg/dL (ref >39)
Triglycerides: 209 mg/dL — ABNORMAL HIGH (ref <150)

## 2013-11-03 LAB — CMP AND LIVER
Alkaline Phosphatase: 79 U/L (ref 39–117)
BUN: 10 mg/dL (ref 6–23)
Glucose, Bld: 91 mg/dL (ref 70–99)
Indirect Bilirubin: 0.4 mg/dL (ref 0.0–0.9)
Sodium: 136 mEq/L (ref 135–145)
Total Bilirubin: 0.5 mg/dL (ref 0.3–1.2)

## 2013-11-03 LAB — CBC WITH DIFFERENTIAL/PLATELET
Basophils Relative: 0 % (ref 0–1)
Eosinophils Absolute: 0.2 10*3/uL (ref 0.0–0.7)
Hemoglobin: 11.9 g/dL — ABNORMAL LOW (ref 12.0–15.0)
MCH: 27.5 pg (ref 26.0–34.0)
MCHC: 33.5 g/dL (ref 30.0–36.0)
Monocytes Absolute: 0.7 10*3/uL (ref 0.1–1.0)
Monocytes Relative: 8 % (ref 3–12)
Neutrophils Relative %: 58 % (ref 43–77)

## 2013-11-03 LAB — TSH: TSH: 1.343 u[IU]/mL (ref 0.350–4.500)

## 2013-11-03 MED ORDER — LOSARTAN POTASSIUM-HCTZ 50-12.5 MG PO TABS: 1.0000 | ORAL_TABLET | Freq: Every day | ORAL | Status: DC

## 2013-11-03 NOTE — Progress Notes (Signed)
Patient ID: Alice Woodard, female   DOB: 07-03-1971, 42 y.o.   MRN: [id number] Patient Demographics  Alice Woodard, is a 42 y.o. female  [id number]  [id number]  DOB - Apr 18, 1971  CC:  Chief Complaint  Patient presents with  . Establish Care       HPI: Alice Woodard is a 42 y.o. female here today to establish medical care. Patient is known to have hypertension on losartan/hydrochlorothiazide 50-12.5 mg tablet by mouth daily. She was seen in the ER recently for medication refill as well as was given this appointment to establish care with [place]. She is here today to get a refill of her medications and to followup with her blood pressure. History is difficult because of language barrier, we used telephone line Interpreter during this encounter. Patient denies any major complaints.  Patient has No headache, No chest pain, No abdominal pain - No Nausea, No new weakness tingling or numbness, No Cough - SOB.  No Known Allergies Past Medical History  Diagnosis Date  . Hypertension    No current outpatient prescriptions on file prior to visit.   No current facility-administered medications on file prior to visit.   History reviewed. No pertinent family history. History   Social History  . Marital Status: Married    Spouse Name: N/A    Number of Children: N/A  . Years of Education: N/A   Occupational History  . Not on file.   Social History Main Topics  . Smoking status: Never Smoker   . Smokeless tobacco: Not on file  . Alcohol Use: No  . Drug Use: No  . Sexual Activity:    Other Topics Concern  . Not on file   Social History Narrative  . No narrative on file    Review of Systems: Constitutional: Negative for fever, chills, diaphoresis, activity change, appetite change and fatigue. HENT: Negative for ear pain, nosebleeds, congestion, facial swelling, rhinorrhea, neck pain, neck stiffness and ear discharge.  Eyes: Negative for pain, discharge, redness, itching and  visual disturbance. Respiratory: Negative for cough, choking, chest tightness, shortness of breath, wheezing and stridor.  Cardiovascular: Negative for chest pain, palpitations and leg swelling. Gastrointestinal: Negative for abdominal distention. Genitourinary: Negative for dysuria, urgency, frequency, hematuria, flank pain, decreased urine volume, difficulty urinating and dyspareunia.  Musculoskeletal: Negative for back pain, joint swelling, arthralgia and gait problem. Neurological: Negative for dizziness, tremors, seizures, syncope, facial asymmetry, speech difficulty, weakness, light-headedness, numbness and headaches.  Hematological: Negative for adenopathy. Does not bruise/bleed easily. Psychiatric/Behavioral: Negative for hallucinations, behavioral problems, confusion, dysphoric mood, decreased concentration and agitation.    Objective:   Filed Vitals:   11/03/13 0912  BP: 151/98  Pulse: 59  Temp: 98 F (36.7 C)  Resp: 16    Physical Exam: Constitutional: Patient appears well-developed and well-nourished. No distress. HENT: Normocephalic, atraumatic, External right and left ear normal. Oropharynx is clear and moist.  Eyes: Conjunctivae and EOM are normal. PERRLA, no scleral icterus. Neck: Normal ROM. Neck supple. No JVD. No tracheal deviation. No thyromegaly. CVS: RRR, S1/S2 +, no murmurs, no gallops, no carotid bruit.  Pulmonary: Effort and breath sounds normal, no stridor, rhonchi, wheezes, rales.  Abdominal: Soft. BS +, no distension, tenderness, rebound or guarding.  Musculoskeletal: Normal range of motion. No edema and no tenderness.  Lymphadenopathy: No lymphadenopathy noted, cervical, inguinal or axillary Neuro: Alert. Normal reflexes, muscle tone coordination. No cranial nerve deficit. Skin: Skin is warm and dry. No rash noted. Not diaphoretic. No erythema. No pallor.  Psychiatric: Normal mood and affect. Behavior, judgment, thought content normal.  Lab Results   Component Value Date   HGB 11.2* 10/02/2013   HCT 33.0* 10/02/2013   Lab Results  Component Value Date   CREATININE 0.90 10/02/2013   BUN 11 10/02/2013   NA 140 10/02/2013   K 3.0* 10/02/2013   CL 103 10/02/2013   CO2 23 05/18/2013    Lab Results  Component Value Date   HGBA1C 5.3 12/26/2012   Lipid Panel  No results found for this basename: chol, trig, hdl, cholhdl, vldl, ldlcalc       Assessment and plan:   1. Essential hypertension, benign  - CBC with Differential - CMP and Liver - TSH - POCT A1C - Lipid panel - Urinalysis, Complete Refill medications - losartan-hydrochlorothiazide (HYZAAR) 50-12.5 MG per tablet; Take 1 tablet by mouth daily.  Dispense: 90 tablet; Refill: 3  Patient was extensively counseled about nutrition and exercise Patient was counseled and educated on blood pressure goals and the need to be compliant with medications and followup  Follow up in 3 months or when necessary  Interpreter was used to communicate directly with patient for the entire encounter including providing detailed patient instructions.   The patient was given clear instructions to go to ER or return to medical center if symptoms don't improve, worsen or new problems develop. The patient verbalized understanding. The patient was told to call to get lab results if they haven't heard anything in the next week.     Jeanann Lewandowsky, MD, MHA, [facility] [place], [place] [phone]   11/03/2013, 10:04 AM

## 2013-11-03 NOTE — Progress Notes (Signed)
Pt is here to establish care. Pt is requesting a physical. She needs medications for her HTN.

## 2013-11-04 LAB — URINALYSIS, COMPLETE
Bacteria, UA: NONE SEEN
Bilirubin Urine: NEGATIVE
Casts: NONE SEEN
Crystals: NONE SEEN
Glucose, UA: NEGATIVE mg/dL
Hgb urine dipstick: NEGATIVE
Ketones, ur: NEGATIVE mg/dL
Nitrite: NEGATIVE
Protein, ur: NEGATIVE mg/dL
Specific Gravity, Urine: 1.011 (ref 1.005–1.030)
Urobilinogen, UA: 0.2 mg/dL (ref 0.0–1.0)
pH: 5.5 (ref 5.0–8.0)

## 2013-12-05 ENCOUNTER — Telehealth: Payer: Self-pay | Admitting: Emergency Medicine

## 2013-12-05 MED ORDER — ATORVASTATIN CALCIUM 20 MG PO TABS: 20.0000 mg | ORAL_TABLET | Freq: Every day | ORAL | Status: DC

## 2013-12-05 NOTE — Telephone Encounter (Signed)
Message copied by Ricci Barker on Sat Dec 05, 2013 10:15 AM ------      Message from: Tresa Garter      Created: Fri Dec 04, 2013  6:13 PM       Please inform patient that her cholesterol level is very high, we need to start her on medication he also encouraged regular physical exercise as well as nutritional control with low cholesterol and low fat diet      Please call in Lipitor 20 mg tablet by mouth daily, 90 tablets with 3 refills ------

## 2013-12-05 NOTE — Telephone Encounter (Signed)
Pt given lab results with instructions to pick script Lipitor 20 mg at [place] via [facility] interpretor  Pt verbalized understanding

## 2013-12-25 ENCOUNTER — Telehealth: Payer: Self-pay | Admitting: *Deleted

## 2013-12-25 NOTE — Telephone Encounter (Signed)
Error

## 2014-02-01 ENCOUNTER — Ambulatory Visit: Payer: BC Managed Care – PPO

## 2014-02-03 ENCOUNTER — Encounter: Payer: Self-pay | Admitting: Pharmacist

## 2014-02-03 ENCOUNTER — Ambulatory Visit: Payer: BC Managed Care – PPO | Attending: Internal Medicine | Admitting: Pharmacist

## 2014-02-03 VITALS — BP 145/87 | HR 87 | Temp 98.5°F | Ht <= 58 in | Wt 122.8 lb

## 2014-02-03 DIAGNOSIS — I10 Essential (primary) hypertension: Secondary | ICD-10-CM

## 2014-02-03 LAB — LIPID PANEL
CHOLESTEROL: 163 mg/dL (ref 0–200)
HDL: 35 mg/dL — ABNORMAL LOW (ref >39)
LDL Cholesterol: 72 mg/dL (ref 0–99)
TRIGLYCERIDES: 280 mg/dL — AB (ref <150)
Total CHOL/HDL Ratio: 4.7 Ratio
VLDL: 56 mg/dL — ABNORMAL HIGH (ref 0–40)

## 2014-02-03 LAB — CBC WITH DIFFERENTIAL/PLATELET
BASOS ABS: 0 10*3/uL (ref 0.0–0.1)
Basophils Relative: 0 % (ref 0–1)
Eosinophils Absolute: 0.2 10*3/uL (ref 0.0–0.7)
Eosinophils Relative: 2 % (ref 0–5)
HCT: 31.1 % — ABNORMAL LOW (ref 36.0–46.0)
Hemoglobin: 10.7 g/dL — ABNORMAL LOW (ref 12.0–15.0)
LYMPHS PCT: 28 % (ref 12–46)
Lymphs Abs: 2.4 10*3/uL (ref 0.7–4.0)
MCH: 28.1 pg (ref 26.0–34.0)
MCHC: 34.4 g/dL (ref 30.0–36.0)
MCV: 81.6 fL (ref 78.0–100.0)
Monocytes Absolute: 0.7 10*3/uL (ref 0.1–1.0)
Monocytes Relative: 8 % (ref 3–12)
NEUTROS ABS: 5.4 10*3/uL (ref 1.7–7.7)
NEUTROS PCT: 62 % (ref 43–77)
Platelets: 284 10*3/uL (ref 150–400)
RBC: 3.81 MIL/uL — ABNORMAL LOW (ref 3.87–5.11)
RDW: 15.5 % (ref 11.5–15.5)
WBC: 8.7 10*3/uL (ref 4.0–10.5)

## 2014-02-03 LAB — TSH: TSH: 1.524 u[IU]/mL (ref 0.350–4.500)

## 2014-02-03 LAB — COMPREHENSIVE METABOLIC PANEL
ALT: 10 U/L (ref 0–35)
AST: 18 U/L (ref 0–37)
Albumin: 3.9 g/dL (ref 3.5–5.2)
Alkaline Phosphatase: 78 U/L (ref 39–117)
BUN: 13 mg/dL (ref 6–23)
CHLORIDE: 105 meq/L (ref 96–112)
CO2: 24 mEq/L (ref 19–32)
CREATININE: 0.61 mg/dL (ref 0.50–1.10)
Calcium: 8.8 mg/dL (ref 8.4–10.5)
GLUCOSE: 76 mg/dL (ref 70–99)
Potassium: 3.7 mEq/L (ref 3.5–5.3)
Sodium: 140 mEq/L (ref 135–145)
Total Bilirubin: 0.7 mg/dL (ref 0.2–1.2)
Total Protein: 6.5 g/dL (ref 6.0–8.3)

## 2014-02-03 MED ORDER — HYDROCHLOROTHIAZIDE 25 MG PO TABS: 25.0000 mg | ORAL_TABLET | Freq: Every day | ORAL | Status: DC

## 2014-02-03 MED ORDER — LOSARTAN POTASSIUM 50 MG PO TABS: 50.0000 mg | ORAL_TABLET | Freq: Every day | ORAL | Status: DC

## 2014-02-03 NOTE — Progress Notes (Signed)
S:    Patient arrives to the clinic for ambulatory blood pressure evaluation.    Medication compliance is patient taking daily.  Current BP Medications include:  Losartan/HCTZ 50/12.5 mg  Antihypertensives tried in the past include:  Patient returned to the clinic and reported no symptoms  O:  Last 3 Office BP readings: 151/98 mmHg   Today's Office BP reading: 156/87 mmHg   BMET    Component Value Date/Time   NA 136 11/03/2013 1003   K 3.8 11/03/2013 1003   CL 103 11/03/2013 1003   CO2 27 11/03/2013 1003   GLUCOSE 91 11/03/2013 1003   BUN 10 11/03/2013 1003   CREATININE 0.70 11/03/2013 1003   CREATININE 0.90 10/02/2013 1036   CALCIUM 9.8 11/03/2013 1003   GFRNONAA 86* 05/18/2013 1010   GFRAA >90 05/18/2013 1010    A/P: Hypertension Pt's blood pressure is not at goal (<140/90) today.  Will increase to Losartan 50 mg and HCTZ 25 mg and see patient in 2 weeks. Labs TSH CMP CBC Vit D  Hyperlipidemia: Pt has not been taking atorvastatin 20 mg daily.  Counseled pt this medication should be taken each day. Lipids

## 2014-02-17 ENCOUNTER — Encounter: Payer: Self-pay | Admitting: Pharmacist

## 2014-02-17 ENCOUNTER — Ambulatory Visit: Payer: BC Managed Care – PPO | Attending: Internal Medicine | Admitting: Pharmacist

## 2014-02-17 VITALS — BP 124/81 | HR 68 | Temp 98.3°F | Ht <= 58 in | Wt 118.0 lb

## 2014-02-17 DIAGNOSIS — I10 Essential (primary) hypertension: Secondary | ICD-10-CM | POA: Insufficient documentation

## 2014-02-17 NOTE — Progress Notes (Signed)
S:    Patient arrives to the clinic for 2 week ambulatory blood pressure evaluation.   Medication compliance is pt taking medication daily.  Current BP Medications include:  Losartan 50 mg, HCTZ 25 mg  Antihypertensives tried in the past include: N/A  Patient returned to the clinic and reported no symptoms.  O:  Last 3 Office BP readings:  145/87 mmHg  151/98 mmHg   Today's Office BP reading: 124/81 mmHg   BMET    Component Value Date/Time   NA 140 02/03/2014 1154   K 3.7 02/03/2014 1154   CL 105 02/03/2014 1154   CO2 24 02/03/2014 1154   GLUCOSE 76 02/03/2014 1154   BUN 13 02/03/2014 1154   CREATININE 0.61 02/03/2014 1154   CREATININE 0.90 10/02/2013 1036   CALCIUM 8.8 02/03/2014 1154   GFRNONAA 86* 05/18/2013 1010   GFRAA >90 05/18/2013 1010    A/P:  There are no changes at this time.  BP is at goal this visit (<140/90).  Will continue Losartan 50 mg and HCTZ 25 mg and have pt follow up with PCP in 3 months unless she needs to return.

## 2014-05-24 ENCOUNTER — Encounter: Payer: Self-pay | Admitting: Internal Medicine

## 2014-05-24 ENCOUNTER — Ambulatory Visit: Payer: No Typology Code available for payment source | Attending: Internal Medicine | Admitting: Internal Medicine

## 2014-05-24 VITALS — BP 150/93 | HR 54 | Temp 98.6°F | Resp 16 | Ht 60.0 in | Wt 118.0 lb

## 2014-05-24 DIAGNOSIS — E785 Hyperlipidemia, unspecified: Secondary | ICD-10-CM

## 2014-05-24 DIAGNOSIS — I10 Essential (primary) hypertension: Secondary | ICD-10-CM

## 2014-05-24 MED ORDER — ATORVASTATIN CALCIUM 20 MG PO TABS: 20.0000 mg | ORAL_TABLET | Freq: Every day | ORAL | Status: DC

## 2014-05-24 MED ORDER — HYDROCHLOROTHIAZIDE 25 MG PO TABS: 25.0000 mg | ORAL_TABLET | Freq: Every day | ORAL | Status: DC

## 2014-05-24 MED ORDER — LOSARTAN POTASSIUM 50 MG PO TABS: 50.0000 mg | ORAL_TABLET | Freq: Every day | ORAL | Status: DC

## 2014-05-24 NOTE — Progress Notes (Signed)
Pt is here following up on her HTN. Pt needs refills on her medications. Pt needed the translator line.

## 2014-05-24 NOTE — Patient Instructions (Signed)
Dyslipidemia Dyslipidemia is an imbalance of the lipids in your blood. Lipids are waxy, fat-like proteins that your body needs in small amounts. Dyslipidemia often involves the lipids cholesterol or triglycerides. Common forms of dyslipidemia are:  High levels of bad cholesterol (LDL cholesterol). LDL cholesterol is the type of cholesterol that causes heart disease.  Low levels of good cholesterol (HDL cholesterol). HDL cholesterol is the type of cholesterol that helps protect against heart disease.  High levels of triglycerides. Triglycerides are a fatty substance in the blood linked to a buildup of plaque on your arteries. RISK FACTORS  Increased age.  Having a family history of high cholesterol.  Certain medicines, including birth control pills, diuretics, beta-blockers, and some medicines for depression.  Smoking.  Eating a high-fat diet.  Being overweight.  Medical conditions such as diabetes, polycystic ovary syndrome, pregnancy, kidney disease, and hypothyroidism.  Lack of regular exercise. SIGNS AND SYMPTOMS There are no signs or symptoms with dyslipidemia.  DIAGNOSIS  A simple blood test called a fasting blood test can be done to determine your level of:  Total cholesterol. This is the combined number of LDL cholesterol and HDL cholesterol. A healthy number is lower than 200.  LDL cholesterol. The goal number for LDL cholesterol is different for each person depending on risk factors. Ask your health care provider what your LDL cholesterol number should be.  HDL cholesterol. A healthy level of HDL cholesterol is 60 or higher. A number lower than 40 for men or 50 for women is a danger sign.  Triglycerides. A healthy triglyceride number is less than 150. TREATMENT  Dyslipidemia is a treatable condition. Your health care provider will advise you on what type of treatment is best based on your age, your test results, and current guidelines. Treatment may include:   Dietary  changes. A dietitian can help you create a meal plan. You may need to:  Eat more foods that contain omega-3s, such as salmon and other fish.  Replace saturated fats and trans fats in your diet with healthy fats such as nuts, seeds, avocados, olive oil, and canola oil.  Regular exercise. This can help lower your LDL cholesterol, raise your HDL cholesterol, and help with weight management. Check with your health care provider before beginning an exercise program. Most people should participate in 30 minutes of brisk exercise 5 days a week.  Quitting smoking.  Medicines to lower LDL cholesterol and triglycerides. Your health care provider will monitor your lipid levels with regular blood tests. HOME CARE INSTRUCTIONS  Eat a healthy diet. Follow any diet instructions if they were given to you by your health care provider.  Maintain a healthy weight.  Exercise regularly based on the recommendations of your health care provider.  Do not use any tobacco products, including cigarettes, chewing tobacco, or electronic cigarettes.  Take all medicines as directed by your health care provider.  Keep all follow-up appointments with your health care provider. SEEK MEDICAL CARE IF: You are having possible side effects from your medicines. Document Released: 11/03/2013 Document Reviewed: 10/07/2013 [facility] Patient Information 2015 [place], [place]. This information is not intended to replace advice given to you by your health care provider. Make sure you discuss any questions you have with your health care provider.

## 2014-05-24 NOTE — Progress Notes (Signed)
Patient ID: Alice Woodard, female   DOB: November 06, 1971, 43 y.o.   MRN: [id number]   Alice Woodard, is a 43 y.o. female  [id number]  [id number]  DOB - [birth year]  No chief complaint on file.       Subjective:   Alice Woodard is a 43 y.o. female here today for a follow up visit. Patient is known to have hypertension on losartan/hydrochlorothiazide 50-12.5 mg tablet by mouth daily. She is here today for routine hypertension followup. Blood pressure is uncontrolled on current regimen. Patient has no complaint today, she needs refill on her medications. Patient has No headache, No chest pain, No abdominal pain - No Nausea, No new weakness tingling or numbness, No Cough - SOB.  Problem  Dyslipidemia  Essential Hypertension, Benign    ALLERGIES: No Known Allergies  PAST MEDICAL HISTORY: Past Medical History  Diagnosis Date  . Hypertension     MEDICATIONS AT HOME: Prior to Admission medications   Medication Sig Start Date End Date Taking? Authorizing Provider  atorvastatin (LIPITOR) 20 MG tablet Take 1 tablet (20 mg total) by mouth daily. 05/24/14  Yes Angelica Chessman, MD  hydrochlorothiazide (HYDRODIURIL) 25 MG tablet Take 1 tablet (25 mg total) by mouth daily. 05/24/14  Yes Angelica Chessman, MD  losartan (COZAAR) 50 MG tablet Take 1 tablet (50 mg total) by mouth daily. 05/24/14  Yes Angelica Chessman, MD     Objective:   Filed Vitals:   05/24/14 1043  BP: 150/93  Pulse: 54  Temp: 98.6 F (37 C)  TempSrc: Oral  Resp: 16  Height: 5' (1.524 m)  Weight: 118 lb (53.524 kg)  SpO2: 100%    Exam General appearance : Awake, alert, not in any distress. Speech Clear. Not toxic looking HEENT: Atraumatic and Normocephalic, pupils equally reactive to light and accomodation Neck: supple, no JVD. No cervical lymphadenopathy.  Chest:Good air entry bilaterally, no added sounds  CVS: S1 S2 regular, no murmurs.  Abdomen: Bowel sounds present, Non tender and not distended with no  gaurding, rigidity or rebound. Extremities: B/L Lower Ext shows no edema, both legs are warm to touch Neurology: Awake alert, and oriented X 3, CN II-XII intact, Non focal Skin:No Rash Wounds:N/A  Data Review Lab Results  Component Value Date   HGBA1C 5.3 12/26/2012     Assessment & Plan   1. Essential hypertension, benign: Blood pressure is not at goal  Patient has been counseled extensively about compliance with medication and blood pressure goal I will separate the combination of losartan/hydrochlorothiazide, and increase hydrochlorothiazide to 25 mg tablet by mouth daily. Patient has been advised to record ambulatory blood pressure, return to the clinic if it's is consistently above 140/90 mmHg  - losartan (COZAAR) 50 MG tablet; Take 1 tablet (50 mg total) by mouth daily.  Dispense: 90 tablet; Refill: 3 - hydrochlorothiazide (HYDRODIURIL) 25 MG tablet; Take 1 tablet (25 mg total) by mouth daily.  Dispense: 90 tablet; Refill: 3  DASH diet  2. Dyslipidemia  - atorvastatin (LIPITOR) 20 MG tablet; Take 1 tablet (20 mg total) by mouth daily.  Dispense: 90 tablet; Refill: 3  Patient was counseled extensively about nutrition and exercise  Interpreter was used to communicate directly with patient for the entire encounter including providing detailed patient instructions.  Return in about 6 months (around 11/24/2014), or if symptoms worsen or fail to improve, for Follow up HTN, dyslipidemia.  The patient was given clear instructions to go to ER or return to medical center if symptoms don't  improve, worsen or new problems develop. The patient verbalized understanding. The patient was told to call to get lab results if they haven't heard anything in the next week.   This note has been created with Surveyor, quantity. Any transcriptional errors are unintentional.    Angelica Chessman, MD, [place], [place], [place] and  [facility] [place], [place]   05/24/2014, 11:54 AM

## 2014-11-18 ENCOUNTER — Ambulatory Visit: Payer: Self-pay | Attending: Internal Medicine | Admitting: Internal Medicine

## 2014-11-18 ENCOUNTER — Encounter: Payer: Self-pay | Admitting: Internal Medicine

## 2014-11-18 VITALS — BP 143/84 | HR 69 | Temp 98.7°F | Resp 16 | Ht <= 58 in | Wt 125.0 lb

## 2014-11-18 DIAGNOSIS — I10 Essential (primary) hypertension: Secondary | ICD-10-CM | POA: Insufficient documentation

## 2014-11-18 DIAGNOSIS — E785 Hyperlipidemia, unspecified: Secondary | ICD-10-CM | POA: Insufficient documentation

## 2014-11-18 DIAGNOSIS — H538 Other visual disturbances: Secondary | ICD-10-CM | POA: Insufficient documentation

## 2014-11-18 MED ORDER — ATORVASTATIN CALCIUM 20 MG PO TABS: 20.0000 mg | ORAL_TABLET | Freq: Every day | ORAL | Status: DC

## 2014-11-18 MED ORDER — LOSARTAN POTASSIUM 50 MG PO TABS: 50.0000 mg | ORAL_TABLET | Freq: Every day | ORAL | Status: DC

## 2014-11-18 MED ORDER — HYDROCHLOROTHIAZIDE 25 MG PO TABS: 25.0000 mg | ORAL_TABLET | Freq: Every day | ORAL | Status: DC

## 2014-11-18 NOTE — Progress Notes (Signed)
Pt is here following up on her HTN. Pt has an interpreter. Pt needs to refill her medication. Pt states that she is having blurred vision.

## 2014-11-18 NOTE — Progress Notes (Signed)
Patient ID: Alice Woodard, female   DOB: [birth year], 44 y.o.   MRN: [id number]   Alice Woodard, is a 44 y.o. female  [id number]  [id number]  DOB - 05-14-1971  Chief Complaint  Patient presents with  . Follow-up        Subjective:   Alice Woodard is a 44 y.o. female here today for a follow up visit. Patient has hypertension on losartan 50 mg tablet by mouth daily and hydrochlorothiazide 25 mg tablet by mouth daily. She is here today for routine follow-up. Patient claims blood pressure is under control, has no significant complaint today, needs refill her medications. She has been having some blurry vision, needs new glasses. Patient has No headache, No chest pain, No abdominal pain - No Nausea, No new weakness tingling or numbness, No Cough - SOB.  Problem  Blurry Vision, Bilateral    ALLERGIES: No Known Allergies  PAST MEDICAL HISTORY: Past Medical History  Diagnosis Date  . Hypertension     MEDICATIONS AT HOME: Prior to Admission medications   Medication Sig Start Date End Date Taking? Authorizing Provider  atorvastatin (LIPITOR) 20 MG tablet Take 1 tablet (20 mg total) by mouth daily. 11/18/14  Yes Tresa Garter, MD  hydrochlorothiazide (HYDRODIURIL) 25 MG tablet Take 1 tablet (25 mg total) by mouth daily. 11/18/14  Yes Tresa Garter, MD  losartan (COZAAR) 50 MG tablet Take 1 tablet (50 mg total) by mouth daily. 11/18/14  Yes Tresa Garter, MD     Objective:   Filed Vitals:   11/18/14 1548  BP: 143/84  Pulse: 69  Temp: 98.7 F (37.1 C)  TempSrc: Oral  Resp: 16  Height: 4\' 10"  (1.473 m)  Weight: 125 lb (56.7 kg)  SpO2: 98%    Exam General appearance : Awake, alert, not in any distress. Speech Clear. Not toxic looking HEENT: Atraumatic and Normocephalic, pupils equally reactive to light and accomodation Neck: supple, no JVD. No cervical lymphadenopathy.  Chest:Good air entry bilaterally, no added sounds  CVS: S1 S2 regular, no murmurs.    Abdomen: Bowel sounds present, Non tender and not distended with no gaurding, rigidity or rebound. Extremities: B/L Lower Ext shows no edema, both legs are warm to touch Neurology: Awake alert, and oriented X 3, CN II-XII intact, Non focal Skin:No Rash Wounds:N/A  Data Review Lab Results  Component Value Date   HGBA1C 5.3 12/26/2012     Assessment & Plan   1. Essential hypertension, benign  - losartan (COZAAR) 50 MG tablet; Take 1 tablet (50 mg total) by mouth daily.  Dispense: 90 tablet; Refill: 3 - hydrochlorothiazide (HYDRODIURIL) 25 MG tablet; Take 1 tablet (25 mg total) by mouth daily.  Dispense: 90 tablet; Refill: 3 - DASH Diet  2. Dyslipidemia  - atorvastatin (LIPITOR) 20 MG tablet; Take 1 tablet (20 mg total) by mouth daily.  Dispense: 90 tablet; Refill: 3  3. Blurry vision, bilateral  - Ambulatory referral to Optometry  Patient was counseled extensively about nutrition and exercise.  Interpreter was used to communicate directly with patient for the entire encounter including providing detailed patient instructions.   Return in about 6 months (around 05/19/2015) for Follow up HTN, Dyslipidemia.  The patient was given clear instructions to go to ER or return to medical center if symptoms don't improve, worsen or new problems develop. The patient verbalized understanding. The patient was told to call to get lab results if they haven't heard anything in the next week.   This note  has been created with Surveyor, quantity. Any transcriptional errors are unintentional.    Angelica Chessman, MD, [place], [place], [place] and [place], [place]   11/18/2014, 4:13 PM

## 2015-07-08 ENCOUNTER — Ambulatory Visit: Payer: Self-pay | Attending: Internal Medicine

## 2015-07-19 ENCOUNTER — Ambulatory Visit: Payer: Self-pay | Attending: Internal Medicine

## 2015-11-25 ENCOUNTER — Telehealth: Payer: Self-pay | Admitting: Internal Medicine

## 2015-11-25 ENCOUNTER — Other Ambulatory Visit: Payer: Self-pay | Admitting: Internal Medicine

## 2015-11-25 DIAGNOSIS — I10 Essential (primary) hypertension: Secondary | ICD-10-CM

## 2015-11-25 DIAGNOSIS — E785 Hyperlipidemia, unspecified: Secondary | ICD-10-CM

## 2015-11-25 MED ORDER — LOSARTAN POTASSIUM 50 MG PO TABS: 50.0000 mg | ORAL_TABLET | Freq: Every day | ORAL | Status: DC

## 2015-11-25 MED ORDER — ATORVASTATIN CALCIUM 20 MG PO TABS: 20.0000 mg | ORAL_TABLET | Freq: Every day | ORAL | Status: DC

## 2015-11-25 MED ORDER — HYDROCHLOROTHIAZIDE 25 MG PO TABS: 25.0000 mg | ORAL_TABLET | Freq: Every day | ORAL | Status: DC

## 2015-11-25 MED FILL — HYDROCHLOROTHIAZIDE 25 MG T: 25 | 30 days supply | Qty: 30 | Fill #0

## 2015-11-25 MED FILL — ?ATORVASTATIN 20 MG TABLET: 20 | 30 days supply | Qty: 30 | Fill #0

## 2015-11-25 MED FILL — LOSARTAN POTASSIUM 50 MG TA: 50 | 30 days supply | Qty: 30 | Fill #0

## 2015-11-25 NOTE — Telephone Encounter (Signed)
Patient came in requesting a medication refill for atorvastatin, losartan and hydrochlorothiazide. Please follow up.

## 2015-12-29 ENCOUNTER — Ambulatory Visit: Payer: Self-pay | Attending: Internal Medicine | Admitting: Internal Medicine

## 2015-12-29 ENCOUNTER — Encounter: Payer: Self-pay | Admitting: Internal Medicine

## 2015-12-29 VITALS — BP 178/109 | HR 63 | Temp 98.7°F | Resp 18 | Ht 59.0 in | Wt 123.2 lb

## 2015-12-29 DIAGNOSIS — G5792 Unspecified mononeuropathy of left lower limb: Secondary | ICD-10-CM

## 2015-12-29 DIAGNOSIS — Z23 Encounter for immunization: Secondary | ICD-10-CM | POA: Insufficient documentation

## 2015-12-29 DIAGNOSIS — R0602 Shortness of breath: Secondary | ICD-10-CM | POA: Insufficient documentation

## 2015-12-29 DIAGNOSIS — I10 Essential (primary) hypertension: Secondary | ICD-10-CM

## 2015-12-29 DIAGNOSIS — M79604 Pain in right leg: Secondary | ICD-10-CM | POA: Insufficient documentation

## 2015-12-29 DIAGNOSIS — Z79899 Other long term (current) drug therapy: Secondary | ICD-10-CM | POA: Insufficient documentation

## 2015-12-29 DIAGNOSIS — G5791 Unspecified mononeuropathy of right lower limb: Secondary | ICD-10-CM

## 2015-12-29 DIAGNOSIS — E785 Hyperlipidemia, unspecified: Secondary | ICD-10-CM

## 2015-12-29 DIAGNOSIS — Z0000 Encounter for general adult medical examination without abnormal findings: Secondary | ICD-10-CM

## 2015-12-29 DIAGNOSIS — G5793 Unspecified mononeuropathy of bilateral lower limbs: Secondary | ICD-10-CM

## 2015-12-29 DIAGNOSIS — M79605 Pain in left leg: Secondary | ICD-10-CM | POA: Insufficient documentation

## 2015-12-29 DIAGNOSIS — Z760 Encounter for issue of repeat prescription: Secondary | ICD-10-CM | POA: Insufficient documentation

## 2015-12-29 LAB — COMPLETE METABOLIC PANEL WITH GFR
ALK PHOS: 114 U/L (ref 33–115)
ALT: 30 U/L — ABNORMAL HIGH (ref 6–29)
AST: 35 U/L (ref 10–35)
Albumin: 4.2 g/dL (ref 3.6–5.1)
BUN: 10 mg/dL (ref 7–25)
CO2: 29 mmol/L (ref 20–31)
Calcium: 9.5 mg/dL (ref 8.6–10.2)
Chloride: 102 mmol/L (ref 98–110)
Creat: 0.76 mg/dL (ref 0.50–1.10)
Glucose, Bld: 82 mg/dL (ref 65–99)
POTASSIUM: 3.6 mmol/L (ref 3.5–5.3)
Sodium: 139 mmol/L (ref 135–146)
TOTAL PROTEIN: 7.4 g/dL (ref 6.1–8.1)
Total Bilirubin: 0.9 mg/dL (ref 0.2–1.2)

## 2015-12-29 LAB — LIPID PANEL
CHOLESTEROL: 124 mg/dL — AB (ref 125–200)
HDL: 33 mg/dL — AB (ref >=46)
LDL Cholesterol: 47 mg/dL (ref <130)
TRIGLYCERIDES: 221 mg/dL — AB (ref <150)
Total CHOL/HDL Ratio: 3.8 Ratio (ref <=5.0)
VLDL: 44 mg/dL — ABNORMAL HIGH (ref <30)

## 2015-12-29 LAB — CBC WITH DIFFERENTIAL/PLATELET
BASOS PCT: 0 % (ref 0–1)
Basophils Absolute: 0 10*3/uL (ref 0.0–0.1)
EOS ABS: 0.2 10*3/uL (ref 0.0–0.7)
Eosinophils Relative: 2 % (ref 0–5)
HCT: 37.4 % (ref 36.0–46.0)
HEMOGLOBIN: 12.8 g/dL (ref 12.0–15.0)
LYMPHS ABS: 2.6 10*3/uL (ref 0.7–4.0)
Lymphocytes Relative: 25 % (ref 12–46)
MCH: 29.8 pg (ref 26.0–34.0)
MCHC: 34.2 g/dL (ref 30.0–36.0)
MCV: 87.2 fL (ref 78.0–100.0)
MONO ABS: 0.6 10*3/uL (ref 0.1–1.0)
MONOS PCT: 6 % (ref 3–12)
MPV: 10 fL (ref 8.6–12.4)
NEUTROS PCT: 67 % (ref 43–77)
Neutro Abs: 7 10*3/uL (ref 1.7–7.7)
PLATELETS: 289 10*3/uL (ref 150–400)
RBC: 4.29 MIL/uL (ref 3.87–5.11)
RDW: 14.3 % (ref 11.5–15.5)
WBC: 10.4 10*3/uL (ref 4.0–10.5)

## 2015-12-29 LAB — POCT GLYCOSYLATED HEMOGLOBIN (HGB A1C): Hemoglobin A1C: 5

## 2015-12-29 MED ORDER — LOSARTAN POTASSIUM 50 MG PO TABS: 50.0000 mg | ORAL_TABLET | Freq: Every day | ORAL | Status: DC

## 2015-12-29 MED ORDER — GABAPENTIN 100 MG PO CAPS: 100.0000 mg | ORAL_CAPSULE | Freq: Three times a day (TID) | ORAL | Status: DC

## 2015-12-29 MED ORDER — HYDROCHLOROTHIAZIDE 25 MG PO TABS: 25.0000 mg | ORAL_TABLET | Freq: Every day | ORAL | Status: DC

## 2015-12-29 MED ORDER — ATORVASTATIN CALCIUM 20 MG PO TABS: 20.0000 mg | ORAL_TABLET | Freq: Every day | ORAL | Status: DC

## 2015-12-29 MED FILL — GABAPENTIN 100 MG CAPSULE: 100 | 30 days supply | Qty: 90 | Fill #0

## 2015-12-29 MED FILL — ATORVASTATIN 20 MG TABLET: 20 | 30 days supply | Qty: 30 | Fill #0

## 2015-12-29 MED FILL — LOSARTAN POTASSIUM 50 MG TA: 50 | 30 days supply | Qty: 30 | Fill #0

## 2015-12-29 MED FILL — HYDROCHLOROTHIAZIDE 25 MG T: 25 | 30 days supply | Qty: 30 | Fill #0

## 2015-12-29 NOTE — Progress Notes (Signed)
Patient ID: Alice Woodard, female   DOB: [birth year], 45 y.o.   MRN: [id number]   Alice Woodard, is a 45 y.o. female  [id number]  [id number]  DOB - 1971/09/21  Chief Complaint  Patient presents with  . Follow-up    Med Refill        Subjective:   Alice Woodard is a 45 y.o. female with history of hypertension on losartan 50 mg tablet by mouth daily and hydrochlorothiazide 25 mg tablet by mouth daily and dyslipidemia here today for a follow up visit for hypertension and medication refills. Her major complaint today is burning pains both legs, mostly at night. No history of trauma. Patient has no personal history of diabetes. Hemoglobin A1c today is 5.0%. There is no history of trauma. No redness, no swelling of any joints. No history of fever. She does not drink alcohol. Patient has No headache, No chest pain, No abdominal pain - No Nausea, No new weakness tingling or numbness. She also complained of occasional shortness of breath associated with activity. No cough.  Problem  Healthcare Maintenance  Sob (Shortness of Breath)    ALLERGIES: No Known Allergies  PAST MEDICAL HISTORY: Past Medical History  Diagnosis Date  . Hypertension     MEDICATIONS AT HOME: Prior to Admission medications   Medication Sig Start Date End Date Taking? Authorizing Provider  atorvastatin (LIPITOR) 20 MG tablet Take 1 tablet (20 mg total) by mouth daily. 12/29/15  Yes Tresa Garter, MD  hydrochlorothiazide (HYDRODIURIL) 25 MG tablet Take 1 tablet (25 mg total) by mouth daily. 12/29/15  Yes Tresa Garter, MD  losartan (COZAAR) 50 MG tablet Take 1 tablet (50 mg total) by mouth daily. 12/29/15  Yes Tresa Garter, MD  gabapentin (NEURONTIN) 100 MG capsule Take 1 capsule (100 mg total) by mouth 3 (three) times daily. 12/29/15   Tresa Garter, MD     Objective:   Filed Vitals:   12/29/15 1051  BP: 178/109  Pulse: 63  Temp: 98.7 F (37.1 C)  TempSrc: Oral  Resp: 18    Height: 4\' 11"  (1.499 m)  Weight: 123 lb 3.2 oz (55.883 kg)  SpO2: 100%    Exam General appearance : Awake, alert, not in any distress. Speech Clear. Not toxic looking HEENT: Atraumatic and Normocephalic, pupils equally reactive to light and accomodation Neck: supple, no JVD. No cervical lymphadenopathy.  Chest:Good air entry bilaterally, no added sounds  CVS: S1 S2 regular, ? murmurs.  Abdomen: Right upper quadrant healed horizontal surgical scar. Bowel sounds present, Non tender and not distended with no gaurding, rigidity or rebound. Extremities: B/L Lower Ext shows no edema, both legs are warm to touch Neurology: Awake alert, and oriented X 3, CN II-XII intact, Non focal  Data Review Lab Results  Component Value Date   HGBA1C 5.3 12/26/2012     Assessment & Plan   1. Healthcare maintenance  - Flu Vaccine QUAD 36+ mos PF IM (Fluarix & Fluzone Quad PF) - CBC with Differential/Platelet - COMPLETE METABOLIC PANEL WITH GFR - POCT glycosylated hemoglobin (Hb A1C) - Lipid panel - Urinalysis, Complete - VITAMIN D 25 Hydroxy (Vit-D Deficiency, Fractures)  2. Essential hypertension: Blood pressure is not controlled, patient has not taken her medications today because she ran out Refill - hydrochlorothiazide (HYDRODIURIL) 25 MG tablet; Take 1 tablet (25 mg total) by mouth daily.  Dispense: 90 tablet; Refill: 3 - losartan (COZAAR) 50 MG tablet; Take 1 tablet (50 mg total) by mouth daily.  Dispense: 90 tablet; Refill: 3  We have discussed target BP range and blood pressure goal. I have advised patient to check BP regularly and to call us back or report to clinic if the numbers are consistently higher than 140/90. We discussed the importance of compliance with medical therapy and DASH diet recommended, consequences of uncontrolled hypertension discussed.   - continue current BP medications  3. Dyslipidemia  - atorvastatin (LIPITOR) 20 MG tablet; Take 1 tablet (20 mg total) by  mouth daily.  Dispense: 90 tablet; Refill: 3  To address this please limit saturated fat to no more than 7% of your calories, limit cholesterol to 200 mg/day, increase fiber and exercise as tolerated. If needed we may add another cholesterol lowering medication to your regimen.   4. SOB (shortness of breath)  - DG Chest 2 View; Future  5. Neuropathic pain of both legs  - gabapentin (NEURONTIN) 100 MG capsule; Take 1 capsule (100 mg total) by mouth 3 (three) times daily.  Dispense: 90 capsule; Refill: 3  Patient have been counseled extensively about nutrition and exercise  Interpreter was used to communicate directly with patient for the entire encounter including providing detailed patient instructions.   Return in about 3 months (around 03/27/2016) for Follow up HTN, Dyslipidemia.  The patient was given clear instructions to go to ER or return to medical center if symptoms don't improve, worsen or new problems develop. The patient verbalized understanding. The patient was told to call to get lab results if they haven't heard anything in the next week.   This note has been created with Surveyor, quantity. Any transcriptional errors are unintentional.    Angelica Chessman, MD, [place], Karilyn Cota, [place] [facility] [place], [place]   12/29/2015, 11:27 AM

## 2015-12-29 NOTE — Progress Notes (Signed)
Patient is here for MED Refill  Patient complains of right heel pain being described as aching and selling.   Patient also complains of GERD.  Patient would like flu shot today. Patient tolerated the flu shot well.

## 2015-12-29 NOTE — Patient Instructions (Signed)
DASH Eating Plan °DASH stands for "Dietary Approaches to Stop Hypertension." The DASH eating plan is a healthy eating plan that has been shown to reduce high blood pressure (hypertension). Additional health benefits may include reducing the risk of type 2 diabetes mellitus, heart disease, and stroke. The DASH eating plan may also help with weight loss. °WHAT DO I NEED TO KNOW ABOUT THE DASH EATING PLAN? °For the DASH eating plan, you will follow these general guidelines: °· Choose foods with a percent daily value for sodium of less than 5% (as listed on the food label). °· Use salt-free seasonings or herbs instead of table salt or sea salt. °· Check with your health care provider or pharmacist before using salt substitutes. °· Eat lower-sodium products, often labeled as "lower sodium" or "no salt added." °· Eat fresh foods. °· Eat more vegetables, fruits, and low-fat dairy products. °· Choose whole grains. Look for the word "whole" as the first word in the ingredient list. °· Choose fish and skinless chicken or turkey more often than red meat. Limit fish, poultry, and meat to 6 oz (170 g) each day. °· Limit sweets, desserts, sugars, and sugary drinks. °· Choose heart-healthy fats. °· Limit cheese to 1 oz (28 g) per day. °· Eat more home-cooked food and less restaurant, buffet, and fast food. °· Limit fried foods. °· Cook foods using methods other than frying. °· Limit canned vegetables. If you do use them, rinse them well to decrease the sodium. °· When eating at a restaurant, ask that your food be prepared with less salt, or no salt if possible. °WHAT FOODS CAN I EAT? °Seek help from a dietitian for individual calorie needs. °Grains °Whole grain or whole wheat bread. Brown rice. Whole grain or whole wheat pasta. Quinoa, bulgur, and whole grain cereals. Low-sodium cereals. Corn or whole wheat flour tortillas. Whole grain cornbread. Whole grain crackers. Low-sodium crackers. °Vegetables °Fresh or frozen vegetables  (raw, steamed, roasted, or grilled). Low-sodium or reduced-sodium tomato and vegetable juices. Low-sodium or reduced-sodium tomato sauce and paste. Low-sodium or reduced-sodium canned vegetables.  °Fruits °All fresh, canned (in natural juice), or frozen fruits. °Meat and Other Protein Products °Ground beef (85% or leaner), grass-fed beef, or beef trimmed of fat. Skinless chicken or turkey. Ground chicken or turkey. Pork trimmed of fat. All fish and seafood. Eggs. Dried beans, peas, or lentils. Unsalted nuts and seeds. Unsalted canned beans. °Dairy °Low-fat dairy products, such as skim or 1% milk, 2% or reduced-fat cheeses, low-fat ricotta or cottage cheese, or plain low-fat yogurt. Low-sodium or reduced-sodium cheeses. °Fats and Oils °Tub margarines without trans fats. Light or reduced-fat mayonnaise and salad dressings (reduced sodium). Avocado. Safflower, olive, or canola oils. Natural peanut or almond butter. °Other °Unsalted popcorn and pretzels. °The items listed above may not be a complete list of recommended foods or beverages. Contact your dietitian for more options. °WHAT FOODS ARE NOT RECOMMENDED? °Grains °White bread. White pasta. White rice. Refined cornbread. Bagels and croissants. Crackers that contain trans fat. °Vegetables °Creamed or fried vegetables. Vegetables in a cheese sauce. Regular canned vegetables. Regular canned tomato sauce and paste. Regular tomato and vegetable juices. °Fruits °Dried fruits. Canned fruit in light or heavy syrup. Fruit juice. °Meat and Other Protein Products °Fatty cuts of meat. Ribs, chicken wings, bacon, sausage, bologna, salami, chitterlings, fatback, hot dogs, bratwurst, and packaged luncheon meats. Salted nuts and seeds. Canned beans with salt. °Dairy °Whole or 2% milk, cream, half-and-half, and cream cheese. Whole-fat or sweetened yogurt. Full-fat   cheeses or blue cheese. Nondairy creamers and whipped toppings. Processed cheese, cheese spreads, or cheese  curds. °Condiments °Onion and garlic salt, seasoned salt, table salt, and sea salt. Canned and packaged gravies. Worcestershire sauce. Tartar sauce. Barbecue sauce. Teriyaki sauce. Soy sauce, including reduced sodium. Steak sauce. Fish sauce. Oyster sauce. Cocktail sauce. Horseradish. Ketchup and mustard. Meat flavorings and tenderizers. Bouillon cubes. Hot sauce. Tabasco sauce. Marinades. Taco seasonings. Relishes. °Fats and Oils °Butter, stick margarine, lard, shortening, ghee, and bacon fat. Coconut, palm kernel, or palm oils. Regular salad dressings. °Other °Pickles and olives. Salted popcorn and pretzels. °The items listed above may not be a complete list of foods and beverages to avoid. Contact your dietitian for more information. °WHERE CAN I FIND MORE INFORMATION? °National Heart, [employer], [employer] Institute: www.nhlbi.nih.gov/health/health-topics/topics/dash/ °  °This information is not intended to replace advice given to you by your health care provider. Make sure you discuss any questions you have with your health care provider. °  °Document Released: 10/18/2011 Document Revised: 11/19/2014 Document Reviewed: 09/02/2013 °Elsevier Interactive Patient Education ©2016 Elsevier Inc. ° °Hypertension °Hypertension, commonly called high blood pressure, is when the force of blood pumping through your arteries is too strong. Your arteries are the blood vessels that carry blood from your heart throughout your body. A blood pressure reading consists of a higher number over a lower number, such as 110/72. The higher number (systolic) is the pressure inside your arteries when your heart pumps. The lower number (diastolic) is the pressure inside your arteries when your heart relaxes. Ideally you want your blood pressure below 120/80. °Hypertension forces your heart to work harder to pump blood. Your arteries may become narrow or stiff. Having untreated or uncontrolled hypertension can cause heart attack, stroke, kidney  disease, and other problems. °RISK FACTORS °Some risk factors for high blood pressure are controllable. Others are not.  °Risk factors you cannot control include:  °· Race. You may be at higher risk if you are African American. °· Age. Risk increases with age. °· Gender. Men are at higher risk than women before age 45 years. After age 65, women are at higher risk than men. °Risk factors you can control include: °· Not getting enough exercise or physical activity. °· Being overweight. °· Getting too much fat, sugar, calories, or salt in your diet. °· Drinking too much alcohol. °SIGNS AND SYMPTOMS °Hypertension does not usually cause signs or symptoms. Extremely high blood pressure (hypertensive crisis) may cause headache, anxiety, shortness of breath, and nosebleed. °DIAGNOSIS °To check if you have hypertension, your health care provider will measure your blood pressure while you are seated, with your arm held at the level of your heart. It should be measured at least twice using the same arm. Certain conditions can cause a difference in blood pressure between your right and left arms. A blood pressure reading that is higher than normal on one occasion does not mean that you need treatment. If it is not clear whether you have high blood pressure, you may be asked to return on a different day to have your blood pressure checked again. Or, you may be asked to monitor your blood pressure at home for 1 or more weeks. °TREATMENT °Treating high blood pressure includes making lifestyle changes and possibly taking medicine. Living a healthy lifestyle can help lower high blood pressure. You may need to change some of your habits. °Lifestyle changes may include: °· Following the DASH diet. This diet is high in fruits, vegetables, and whole   grains. It is low in salt, red meat, and added sugars. °· Keep your sodium intake below 2,300 mg per day. °· Getting at least 30-45 minutes of aerobic exercise at least 4 times per  week. °· Losing weight if necessary. °· Not smoking. °· Limiting alcoholic beverages. °· Learning ways to reduce stress. °Your health care provider may prescribe medicine if lifestyle changes are not enough to get your blood pressure under control, and if one of the following is true: °· You are 18-59 years of age and your systolic blood pressure is above 140. °· You are 60 years of age or older, and your systolic blood pressure is above 150. °· Your diastolic blood pressure is above 90. °· You have diabetes, and your systolic blood pressure is over 140 or your diastolic blood pressure is over 90. °· You have kidney disease and your blood pressure is above 140/90. °· You have heart disease and your blood pressure is above 140/90. °Your personal target blood pressure may vary depending on your medical conditions, your age, and other factors. °HOME CARE INSTRUCTIONS °· Have your blood pressure rechecked as directed by your health care provider.   °· Take medicines only as directed by your health care provider. Follow the directions carefully. Blood pressure medicines must be taken as prescribed. The medicine does not work as well when you skip doses. Skipping doses also puts you at risk for problems. °· Do not smoke.   °· Monitor your blood pressure at home as directed by your health care provider.  °SEEK MEDICAL CARE IF:  °· You think you are having a reaction to medicines taken. °· You have recurrent headaches or feel dizzy. °· You have swelling in your ankles. °· You have trouble with your vision. °SEEK IMMEDIATE MEDICAL CARE IF: °· You develop a severe headache or confusion. °· You have unusual weakness, numbness, or feel faint. °· You have severe chest or abdominal pain. °· You vomit repeatedly. °· You have trouble breathing. °MAKE SURE YOU:  °· Understand these instructions. °· Will watch your condition. °· Will get help right away if you are not doing well or get worse. °  °This information is not intended to  replace advice given to you by your health care provider. Make sure you discuss any questions you have with your health care provider. °  °Document Released: 10/29/2005 Document Revised: 03/15/2015 Document Reviewed: 08/21/2013 °Elsevier Interactive Patient Education ©2016 Elsevier Inc. ° °

## 2015-12-30 LAB — URINALYSIS, COMPLETE
BILIRUBIN URINE: NEGATIVE
Casts: NONE SEEN [LPF]
Crystals: NONE SEEN [HPF]
GLUCOSE, UA: NEGATIVE
KETONES UR: NEGATIVE
Nitrite: NEGATIVE
PROTEIN: NEGATIVE
Specific Gravity, Urine: 1.006 (ref 1.001–1.035)
Yeast: NONE SEEN [HPF]
pH: 6 (ref 5.0–8.0)

## 2015-12-30 LAB — VITAMIN D 25 HYDROXY (VIT D DEFICIENCY, FRACTURES): Vit D, 25-Hydroxy: 19 ng/mL — ABNORMAL LOW (ref 30–100)

## 2016-01-02 ENCOUNTER — Telehealth: Payer: Self-pay | Admitting: *Deleted

## 2016-01-02 ENCOUNTER — Other Ambulatory Visit: Payer: Self-pay | Admitting: Internal Medicine

## 2016-01-02 MED ORDER — CIPROFLOXACIN HCL 500 MG PO TABS: 500.0000 mg | ORAL_TABLET | Freq: Two times a day (BID) | ORAL | Status: AC

## 2016-01-02 MED ORDER — VITAMIN D (ERGOCALCIFEROL) 1.25 MG (50000 UNIT) PO CAPS: 50000.0000 [IU] | ORAL_CAPSULE | ORAL | Status: DC

## 2016-01-02 NOTE — Telephone Encounter (Signed)
Interpreter Name:[place] Interpreter #: [phone] Medical Assistant used [employer] to contact patient.    Medical Assistant left message on patient's home voicemail. Voicemail states to give a call back to [place] with [facility] at [phone].

## 2016-01-02 NOTE — Telephone Encounter (Signed)
-----   Message from Tresa Garter, MD sent at 01/02/2016 12:26 PM EST ----- Please inform patient that her laboratories shows evidence of urinary tract infection, low vitamin D level and improved cholesterol level. We will treat the urinary tract infection with antibiotics, replace vitamin D with vitamin D capsule. Continue other medications as prescribed. Medications have been prescribed to the pharmacy for pickup.

## 2016-01-06 ENCOUNTER — Ambulatory Visit: Payer: Self-pay | Attending: Internal Medicine

## 2016-01-27 MED FILL — ATORVASTATIN 20 MG TABLET: 20 | 30 days supply | Qty: 30 | Fill #1

## 2016-01-27 MED FILL — GABAPENTIN 100 MG CAPSULE: 100 | 30 days supply | Qty: 90 | Fill #1

## 2016-01-27 MED FILL — LOSARTAN POTASSIUM 50 MG TA: 50 | 30 days supply | Qty: 30 | Fill #1

## 2016-01-27 MED FILL — HYDROCHLOROTHIAZIDE 25 MG T: 25 | 30 days supply | Qty: 30 | Fill #1

## 2016-02-23 MED FILL — ?ATORVASTATIN 20 MG TABLET: 20 | 30 days supply | Qty: 30 | Fill #2

## 2016-02-23 MED FILL — LOSARTAN POTASSIUM 50 MG TA: 50 | 30 days supply | Qty: 30 | Fill #2

## 2016-02-23 MED FILL — GABAPENTIN 100 MG CAPSULE: 100 | 30 days supply | Qty: 90 | Fill #2

## 2016-02-23 MED FILL — ?HYDROCHLOROTHIAZIDE 25 MG: 25 MG | 30 days supply | Qty: 30 | Fill #2

## 2016-03-23 MED FILL — LOSARTAN POTASSIUM 50 MG TA: 50 | 30 days supply | Qty: 30 | Fill #3

## 2016-03-23 MED FILL — ATORVASTATIN 20 MG TABLET: 20 | 30 days supply | Qty: 30 | Fill #3

## 2016-03-23 MED FILL — ?HYDROCHLOROTHIAZIDE 25 MG: 25 MG | 30 days supply | Qty: 30 | Fill #3

## 2016-03-23 MED FILL — GABAPENTIN 100 MG CAPSULE: 100 | 30 days supply | Qty: 90 | Fill #3

## 2016-03-29 ENCOUNTER — Ambulatory Visit: Payer: Self-pay | Admitting: Internal Medicine

## 2016-04-24 ENCOUNTER — Other Ambulatory Visit: Payer: Self-pay | Admitting: Internal Medicine

## 2016-04-24 MED FILL — ?ATORVASTATIN 20 MG TABLET: 20 | 30 days supply | Qty: 30 | Fill #4

## 2016-04-24 MED FILL — LOSARTAN POTASSIUM 50 MG TA: 50 | 30 days supply | Qty: 30 | Fill #4

## 2016-04-24 MED FILL — HYDROCHLOROTHIAZIDE 25 MG T: 25 | 30 days supply | Qty: 30 | Fill #4

## 2016-04-24 MED FILL — GABAPENTIN 100 MG CAPSULE: 100 | 30 days supply | Qty: 90 | Fill #0

## 2016-05-04 ENCOUNTER — Ambulatory Visit: Payer: Self-pay | Attending: Family Medicine | Admitting: Family Medicine

## 2016-05-04 ENCOUNTER — Encounter: Payer: Self-pay | Admitting: Family Medicine

## 2016-05-04 VITALS — BP 130/90 | HR 66 | Temp 98.4°F | Resp 20 | Ht 60.0 in | Wt 122.2 lb

## 2016-05-04 DIAGNOSIS — K2970 Gastritis, unspecified, without bleeding: Secondary | ICD-10-CM

## 2016-05-04 DIAGNOSIS — K2990 Gastroduodenitis, unspecified, without bleeding: Secondary | ICD-10-CM

## 2016-05-04 DIAGNOSIS — I10 Essential (primary) hypertension: Secondary | ICD-10-CM

## 2016-05-04 DIAGNOSIS — E785 Hyperlipidemia, unspecified: Secondary | ICD-10-CM

## 2016-05-04 DIAGNOSIS — M7661 Achilles tendinitis, right leg: Secondary | ICD-10-CM | POA: Insufficient documentation

## 2016-05-04 MED ORDER — DICLOFENAC SODIUM 75 MG PO TBEC: 75.0000 mg | DELAYED_RELEASE_TABLET | Freq: Two times a day (BID) | ORAL | Status: DC

## 2016-05-04 MED ORDER — PANTOPRAZOLE SODIUM 40 MG PO TBEC: 40.0000 mg | DELAYED_RELEASE_TABLET | Freq: Every day | ORAL | Status: DC

## 2016-05-04 MED FILL — ?DICLOFENAC SOD DR 75 MG TA: 75 | 15 days supply | Qty: 30 | Fill #0

## 2016-05-04 MED FILL — ?PANTOPRAZOLE SOD DR 40MG: 40 MG | 30 days supply | Qty: 30 | Fill #0

## 2016-05-04 NOTE — Progress Notes (Signed)
Right sided foot swelling 2-3 years Gastritis- has a lot of gas "very forgetful"

## 2016-05-04 NOTE — Patient Instructions (Signed)
Hypertension Hypertension, commonly called high blood pressure, is when the force of blood pumping through your arteries is too strong. Your arteries are the blood vessels that carry blood from your heart throughout your body. A blood pressure reading consists of a higher number over a lower number, such as 110/72. The higher number (systolic) is the pressure inside your arteries when your heart pumps. The lower number (diastolic) is the pressure inside your arteries when your heart relaxes. Ideally you want your blood pressure below 120/80. Hypertension forces your heart to work harder to pump blood. Your arteries may become narrow or stiff. Having untreated or uncontrolled hypertension can cause heart attack, stroke, kidney disease, and other problems. RISK FACTORS Some risk factors for high blood pressure are controllable. Others are not.  Risk factors you cannot control include:   Race. You may be at higher risk if you are African American.  Age. Risk increases with age.  Gender. Men are at higher risk than women before age 45 years. After age 65, women are at higher risk than men. Risk factors you can control include:  Not getting enough exercise or physical activity.  Being overweight.  Getting too much fat, sugar, calories, or salt in your diet.  Drinking too much alcohol. SIGNS AND SYMPTOMS Hypertension does not usually cause signs or symptoms. Extremely high blood pressure (hypertensive crisis) may cause headache, anxiety, shortness of breath, and nosebleed. DIAGNOSIS To check if you have hypertension, your health care provider will measure your blood pressure while you are seated, with your arm held at the level of your heart. It should be measured at least twice using the same arm. Certain conditions can cause a difference in blood pressure between your right and left arms. A blood pressure reading that is higher than normal on one occasion does not mean that you need treatment. If  it is not clear whether you have high blood pressure, you may be asked to return on a different day to have your blood pressure checked again. Or, you may be asked to monitor your blood pressure at home for 1 or more weeks. TREATMENT Treating high blood pressure includes making lifestyle changes and possibly taking medicine. Living a healthy lifestyle can help lower high blood pressure. You may need to change some of your habits. Lifestyle changes may include:  Following the DASH diet. This diet is high in fruits, vegetables, and whole grains. It is low in salt, red meat, and added sugars.  Keep your sodium intake below 2,300 mg per day.  Getting at least 30-45 minutes of aerobic exercise at least 4 times per week.  Losing weight if necessary.  Not smoking.  Limiting alcoholic beverages.  Learning ways to reduce stress. Your health care provider may prescribe medicine if lifestyle changes are not enough to get your blood pressure under control, and if one of the following is true:  You are 18-59 years of age and your systolic blood pressure is above 140.  You are 60 years of age or older, and your systolic blood pressure is above 150.  Your diastolic blood pressure is above 90.  You have diabetes, and your systolic blood pressure is over 140 or your diastolic blood pressure is over 90.  You have kidney disease and your blood pressure is above 140/90.  You have heart disease and your blood pressure is above 140/90. Your personal target blood pressure may vary depending on your medical conditions, your age, and other factors. HOME CARE INSTRUCTIONS    Have your blood pressure rechecked as directed by your health care provider.   Take medicines only as directed by your health care provider. Follow the directions carefully. Blood pressure medicines must be taken as prescribed. The medicine does not work as well when you skip doses. Skipping doses also puts you at risk for  problems.  Do not smoke.   Monitor your blood pressure at home as directed by your health care provider. SEEK MEDICAL CARE IF:   You think you are having a reaction to medicines taken.  You have recurrent headaches or feel dizzy.  You have swelling in your ankles.  You have trouble with your vision. SEEK IMMEDIATE MEDICAL CARE IF:  You develop a severe headache or confusion.  You have unusual weakness, numbness, or feel faint.  You have severe chest or abdominal pain.  You vomit repeatedly.  You have trouble breathing. MAKE SURE YOU:   Understand these instructions.  Will watch your condition.  Will get help right away if you are not doing well or get worse.   This information is not intended to replace advice given to you by your health care provider. Make sure you discuss any questions you have with your health care provider.   Document Released: 10/29/2005 Document Revised: 03/15/2015 Document Reviewed: 08/21/2013 Elsevier Interactive Patient Education 2016 Elsevier Inc.  

## 2016-05-04 NOTE — Progress Notes (Signed)
Subjective:  Patient ID: Alice Woodard, female    DOB: [birth year]  Age: 45 y.o. MRN: [id number]  CC: Follow-up and Foot Swelling   HPI Laronica Mcdaniels is a 45 year old female seen with the aid of a Nepali video interpreter with a history of hypertension, hyperlipidemia who comes into the clinic today for follow-up visit.  She complains of posterior right ankle pain for the last 2-3 years but then informs me has been on for one month and denies a history of trauma. States over-the-counter medications do not help.  Also complains of abdominal bloating, excessive gas and abdomen with no abdominal pain, no nausea, no vomiting, no diarrhea, no constipation.  She has been compliant with her antihypertensive and statin and also with a low-sodium diet.  Outpatient Prescriptions Prior to Visit  Medication Sig Dispense Refill  . atorvastatin (LIPITOR) 20 MG tablet Take 1 tablet (20 mg total) by mouth daily. 90 tablet 3  . gabapentin (NEURONTIN) 100 MG capsule Take 1 capsule (100 mg total) by mouth 3 (three) times daily. Must have office visit 90 capsule 0  . hydrochlorothiazide (HYDRODIURIL) 25 MG tablet Take 1 tablet (25 mg total) by mouth daily. 90 tablet 3  . losartan (COZAAR) 50 MG tablet Take 1 tablet (50 mg total) by mouth daily. 90 tablet 3  . Vitamin D, Ergocalciferol, (DRISDOL) 50000 units CAPS capsule Take 1 capsule (50,000 Units total) by mouth every 7 (seven) days. 12 capsule 3   No facility-administered medications prior to visit.    ROS Review of Systems  Constitutional: Negative for activity change, appetite change and fatigue.  HENT: Negative for congestion, sinus pressure and sore throat.   Eyes: Negative for visual disturbance.  Respiratory: Negative for cough, chest tightness, shortness of breath and wheezing.   Cardiovascular: Negative for chest pain and palpitations.  Gastrointestinal:       See history of present illness  Endocrine: Negative for polydipsia.    Genitourinary: Negative for dysuria and frequency.  Musculoskeletal: Negative for back pain.  Skin: Negative for rash.  Neurological: Negative for tremors, light-headedness and numbness.  Hematological: Does not bruise/bleed easily.  Psychiatric/Behavioral: Negative for behavioral problems and agitation.    Objective:  BP 130/90 mmHg  Pulse 66  Temp(Src) 98.4 F (36.9 C) (Oral)  Resp 20  Ht 5' (1.524 m)  Wt 122 lb 3.2 oz (55.43 kg)  BMI 23.87 kg/m2  SpO2 99%  BP/Weight 05/04/2016 [id number]  Systolic BP [id number]  Diastolic BP 90 0000000 84  Wt. (Lbs) 122.2 123.2 125  BMI 23.87 24.87 26.13      Physical Exam  Constitutional: She is oriented to person, place, and time. She appears well-developed and well-nourished.  Cardiovascular: Normal rate, normal heart sounds and intact distal pulses.   No murmur heard. Pulmonary/Chest: Effort normal and breath sounds normal. She has no wheezes. She has no rales. She exhibits no tenderness.  Abdominal: Soft. Bowel sounds are normal. She exhibits no distension and no mass. There is no tenderness.  Musculoskeletal: Normal range of motion. She exhibits edema (mild induration and posterior right ankle with associated tenderness to palpation).  Neurological: She is alert and oriented to person, place, and time.     CMP Latest Ref Rng 12/29/2015 02/03/2014 11/03/2013  Glucose 65 - 99 mg/dL 82 76 91  BUN 7 - 25 mg/dL 10 13 10   Creatinine 0.50 - 1.10 mg/dL 0.76 0.61 0.70  Sodium 135 - 146 mmol/L 139 140 136  Potassium 3.5 -  5.3 mmol/L 3.6 3.7 3.8  Chloride 98 - 110 mmol/L 102 105 103  CO2 20 - 31 mmol/L 29 24 27   Calcium 8.6 - 10.2 mg/dL 9.5 8.8 9.8  Total Protein 6.1 - 8.1 g/dL 7.4 6.5 7.6  Total Bilirubin 0.2 - 1.2 mg/dL 0.9 0.7 0.5  Alkaline Phos 33 - 115 U/L 114 78 79  AST 10 - 35 U/L 35 18 17  ALT 6 - 29 U/L 30(H) 10 11     Lipid Panel     Component Value Date/Time   CHOL 124* 12/29/2015 1126   TRIG 221* 12/29/2015  1126   HDL 33* 12/29/2015 1126   CHOLHDL 3.8 12/29/2015 1126   VLDL 44* 12/29/2015 1126   LDLCALC 47 12/29/2015 1126      Assessment & Plan:   1. Dyslipidemia Controlled Continue atorvastatin Low-cholesterol diet  2. Essential hypertension, benign Initially elevated but repeat blood pressure performed manually was normal Continue hydrochlorothiazide, losartan. Low-sodium diet  3. Gastritis and gastroduodenitis Dietary modifications to prevent foods that trigger this. Placed on PPI  4. Achilles tendinitis of right lower extremity Placed on NSAIDs   Meds ordered this encounter  Medications  . pantoprazole (PROTONIX) 40 MG tablet    Sig: Take 1 tablet (40 mg total) by mouth daily.    Dispense:  30 tablet    Refill:  3  . diclofenac (VOLTAREN) 75 MG EC tablet    Sig: Take 1 tablet (75 mg total) by mouth 2 (two) times daily.    Dispense:  30 tablet    Refill:  2    Follow-up: Return in about 3 months (around 08/04/2016) for follow up of hypertension.   Arnoldo Morale MD

## 2016-05-25 MED FILL — DICLOFENAC SOD DR 75 MG TAB: 75 | 15 days supply | Qty: 30 | Fill #1

## 2016-05-25 MED FILL — LOSARTAN POTASSIUM 50 MG TA: 50 | 30 days supply | Qty: 30 | Fill #5

## 2016-05-25 MED FILL — HYDROCHLOROTHIAZIDE 25 MG T: 25 | 30 days supply | Qty: 30 | Fill #5

## 2016-05-25 MED FILL — ?ATORVASTATIN 20 MG TABLET: 20 | 30 days supply | Qty: 30 | Fill #5

## 2016-06-08 MED FILL — ?PANTOPRAZOLE SOD DR 40MG: 40 MG | 30 days supply | Qty: 30 | Fill #1

## 2016-06-22 MED FILL — HYDROCHLOROTHIAZIDE 25 MG T: 25 | 30 days supply | Qty: 30 | Fill #6

## 2016-06-22 MED FILL — ?DICLOFENAC SOD DR 75 MG TA: 75 | 15 days supply | Qty: 30 | Fill #2

## 2016-06-22 MED FILL — LOSARTAN POTASSIUM 50 MG TA: 50 | 30 days supply | Qty: 30 | Fill #6

## 2016-06-22 MED FILL — ?ATORVASTATIN 20 MG TABLET: 20 | 30 days supply | Qty: 30 | Fill #6

## 2016-07-17 ENCOUNTER — Other Ambulatory Visit: Payer: Self-pay | Admitting: Family Medicine

## 2016-07-17 MED FILL — ?PANTOPRAZOLE SOD DR 40MG: 40 MG | 30 days supply | Qty: 30 | Fill #2

## 2016-07-17 MED FILL — LOSARTAN POTASSIUM 50 MG TA: 50 | 30 days supply | Qty: 30 | Fill #7

## 2016-07-17 MED FILL — HYDROCHLOROTHIAZIDE 25 MG T: 25 | 30 days supply | Qty: 30 | Fill #7

## 2016-07-17 MED FILL — ?ATORVASTATIN 20 MG TABLET: 20 | 30 days supply | Qty: 30 | Fill #7

## 2016-07-17 MED FILL — ?DICLOFENAC SOD DR 75 MG TA: 75 | 15 days supply | Qty: 30 | Fill #0

## 2016-08-10 ENCOUNTER — Encounter: Payer: Self-pay | Admitting: Family Medicine

## 2016-08-10 ENCOUNTER — Ambulatory Visit: Payer: Self-pay | Attending: Family Medicine | Admitting: Family Medicine

## 2016-08-10 VITALS — BP 152/83 | HR 65 | Temp 98.2°F | Ht 59.75 in | Wt 120.0 lb

## 2016-08-10 DIAGNOSIS — K2970 Gastritis, unspecified, without bleeding: Secondary | ICD-10-CM | POA: Insufficient documentation

## 2016-08-10 DIAGNOSIS — I10 Essential (primary) hypertension: Secondary | ICD-10-CM | POA: Insufficient documentation

## 2016-08-10 DIAGNOSIS — Z79899 Other long term (current) drug therapy: Secondary | ICD-10-CM | POA: Insufficient documentation

## 2016-08-10 DIAGNOSIS — E781 Pure hyperglyceridemia: Secondary | ICD-10-CM | POA: Insufficient documentation

## 2016-08-10 DIAGNOSIS — E785 Hyperlipidemia, unspecified: Secondary | ICD-10-CM

## 2016-08-10 DIAGNOSIS — M7661 Achilles tendinitis, right leg: Secondary | ICD-10-CM | POA: Insufficient documentation

## 2016-08-10 DIAGNOSIS — L659 Nonscarring hair loss, unspecified: Secondary | ICD-10-CM | POA: Insufficient documentation

## 2016-08-10 DIAGNOSIS — K2990 Gastroduodenitis, unspecified, without bleeding: Secondary | ICD-10-CM | POA: Insufficient documentation

## 2016-08-10 LAB — COMPLETE METABOLIC PANEL WITH GFR
ALBUMIN: 4.3 g/dL (ref 3.6–5.1)
ALK PHOS: 87 U/L (ref 33–115)
ALT: 25 U/L (ref 6–29)
AST: 30 U/L (ref 10–35)
BILIRUBIN TOTAL: 0.7 mg/dL (ref 0.2–1.2)
BUN: 12 mg/dL (ref 7–25)
CO2: 28 mmol/L (ref 20–31)
Calcium: 9.5 mg/dL (ref 8.6–10.2)
Chloride: 103 mmol/L (ref 98–110)
Creat: 0.87 mg/dL (ref 0.50–1.10)
GFR, EST NON AFRICAN AMERICAN: 80 mL/min (ref >=60)
Glucose, Bld: 78 mg/dL (ref 65–99)
Potassium: 3.1 mmol/L — ABNORMAL LOW (ref 3.5–5.3)
Sodium: 142 mmol/L (ref 135–146)
TOTAL PROTEIN: 7.5 g/dL (ref 6.1–8.1)

## 2016-08-10 LAB — TSH: TSH: 1.79 m[IU]/L

## 2016-08-10 MED ORDER — HYDROCHLOROTHIAZIDE 25 MG PO TABS: 25.0000 mg | ORAL_TABLET | Freq: Every day | ORAL | 1 refills | Status: DC

## 2016-08-10 MED ORDER — NAPROXEN 500 MG PO TABS: 500.0000 mg | ORAL_TABLET | Freq: Two times a day (BID) | ORAL | 0 refills | Status: DC

## 2016-08-10 MED ORDER — PANTOPRAZOLE SODIUM 40 MG PO TBEC: 40.0000 mg | DELAYED_RELEASE_TABLET | Freq: Every day | ORAL | 1 refills | Status: DC

## 2016-08-10 MED ORDER — GABAPENTIN 100 MG PO CAPS: 100.0000 mg | ORAL_CAPSULE | Freq: Three times a day (TID) | ORAL | 1 refills | Status: DC

## 2016-08-10 MED ORDER — ATORVASTATIN CALCIUM 20 MG PO TABS: 20.0000 mg | ORAL_TABLET | Freq: Every day | ORAL | 1 refills | Status: DC

## 2016-08-10 MED ORDER — LOSARTAN POTASSIUM 100 MG PO TABS: 100.0000 mg | ORAL_TABLET | Freq: Every day | ORAL | 1 refills | Status: DC

## 2016-08-10 NOTE — Progress Notes (Signed)
Subjective:  Patient ID: Alice Woodard, female    DOB: 12/30/1970  Age: 45 y.o. MRN: [id number]  CC: Hypertension (follow up)   HPI Alice Woodard is a 45 year old female with a history of hypertension, gastritis, dyslipidemia who presents today for follow-up visit.  She has been compliant with her antihypertensive, her PPI and statin. Takes diclofenac for right Achilles tendinitis which relieves the symptoms to some extent but she thinks this may be causing alopecia as she noticed her hair falling out when she commenced the diclofenac. Also has a burning sensation in her right leg and foot but not in any other body parts  Denies abdominal pain, nausea or vomiting and reflux symptoms are controlled on Protonix.  Requests refill of all her medications.  No Known Allergies  Outpatient Medications Prior to Visit  Medication Sig Dispense Refill  . atorvastatin (LIPITOR) 20 MG tablet Take 1 tablet (20 mg total) by mouth daily. 90 tablet 3  . diclofenac (VOLTAREN) 75 MG EC tablet TAKE 1 TABLET BY MOUTH 2 TIMES DAILY. 30 tablet 0  . hydrochlorothiazide (HYDRODIURIL) 25 MG tablet Take 1 tablet (25 mg total) by mouth daily. 90 tablet 3  . losartan (COZAAR) 50 MG tablet Take 1 tablet (50 mg total) by mouth daily. 90 tablet 3  . pantoprazole (PROTONIX) 40 MG tablet Take 1 tablet (40 mg total) by mouth daily. 30 tablet 3  . Vitamin D, Ergocalciferol, (DRISDOL) 50000 units CAPS capsule Take 1 capsule (50,000 Units total) by mouth every 7 (seven) days. 12 capsule 3  . gabapentin (NEURONTIN) 100 MG capsule Take 1 capsule (100 mg total) by mouth 3 (three) times daily. Must have office visit (Patient not taking: Reported on 08/10/2016) 90 capsule 0   No facility-administered medications prior to visit.     ROS Review of Systems  Constitutional: Negative for activity change, appetite change and fatigue.  HENT: Negative for congestion, sinus pressure and sore throat.   Eyes: Negative for visual  disturbance.  Respiratory: Negative for cough, chest tightness, shortness of breath and wheezing.   Cardiovascular: Negative for chest pain and palpitations.  Gastrointestinal: Negative for abdominal distention, abdominal pain and constipation.  Endocrine: Negative for polydipsia.  Genitourinary: Negative for dysuria and frequency.  Musculoskeletal:       See hpi  Skin: Negative for rash.  Neurological: Negative for tremors, light-headedness and numbness.  Hematological: Does not bruise/bleed easily.  Psychiatric/Behavioral: Negative for agitation and behavioral problems.    Objective:  BP (!) 152/83 (BP Location: Right Arm, Patient Position: Sitting, Cuff Size: Small)   Pulse 65   Temp 98.2 F (36.8 C) (Oral)   Ht 4' 11.75" (1.518 m)   Wt 120 lb (54.4 kg)   SpO2 98%   BMI 23.63 kg/m   BP/Weight 08/10/2016 05/04/2016 [id number]  Systolic BP 0000000 [id number] 0000000  Diastolic BP 83 90 0000000  Wt. (Lbs) 120 122.2 123.2  BMI 23.63 23.87 24.87      Physical Exam Constitutional: She is oriented to person, place, and time. She appears well-developed and well-nourished.  Cardiovascular: Normal rate, normal heart sounds and intact distal pulses.   No murmur heard. Pulmonary/Chest: Effort normal and breath sounds normal. She has no wheezes. She has no rales. She exhibits no tenderness.  Abdominal: Soft. Bowel sounds are normal. She exhibits no distension and no mass. There is no tenderness.  Musculoskeletal: Normal range of motion. She exhibits edema (mild induration of posterior right ankle with associated tenderness to palpation).  Neurological: She  is alert and oriented to person, place, and time  CMP Latest Ref Rng & Units 12/29/2015 02/03/2014 11/03/2013  Glucose 65 - 99 mg/dL 82 76 91  BUN 7 - 25 mg/dL 10 13 10   Creatinine 0.50 - 1.10 mg/dL 0.76 0.61 0.70  Sodium 135 - 146 mmol/L 139 140 136  Potassium 3.5 - 5.3 mmol/L 3.6 3.7 3.8  Chloride 98 - 110 mmol/L 102 105 103  CO2 20 - 31 mmol/L  29 24 27   Calcium 8.6 - 10.2 mg/dL 9.5 8.8 9.8  Total Protein 6.1 - 8.1 g/dL 7.4 6.5 7.6  Total Bilirubin 0.2 - 1.2 mg/dL 0.9 0.7 0.5  Alkaline Phos 33 - 115 U/L 114 78 79  AST 10 - 35 U/L 35 18 17  ALT 6 - 29 U/L 30(H) 10 11    Lipid Panel     Component Value Date/Time   CHOL 124 (L) 12/29/2015 1126   TRIG 221 (H) 12/29/2015 1126   HDL 33 (L) 12/29/2015 1126   CHOLHDL 3.8 12/29/2015 1126   VLDL 44 (H) 12/29/2015 1126   LDLCALC 47 12/29/2015 1126    Assessment & Plan:   1. Dyslipidemia Hypertriglyceridemia Advised on low cholesterol diet - atorvastatin (LIPITOR) 20 MG tablet; Take 1 tablet (20 mg total) by mouth daily.  Dispense: 90 tablet; Refill: 1  2. Essential hypertension, benign Uncontrolled Raised dose of Losartan - hydrochlorothiazide (HYDRODIURIL) 25 MG tablet; Take 1 tablet (25 mg total) by mouth daily.  Dispense: 90 tablet; Refill: 1 - losartan (COZAAR) 100 MG tablet; Take 1 tablet (100 mg total) by mouth daily.  Dispense: 90 tablet; Refill: 1 - COMPLETE METABOLIC PANEL WITH GFR  3. Gastritis and gastroduodenitis Controlled - pantoprazole (PROTONIX) 40 MG tablet; Take 1 tablet (40 mg total) by mouth daily.  Dispense: 90 tablet; Refill: 1  4. Achilles tendinitis of right lower extremity - naproxen (NAPROSYN) 500 MG tablet; Take 1 tablet (500 mg total) by mouth 2 (two) times daily with a meal.  Dispense: 180 tablet; Refill: 0  5. Alopecia D/c Losartan as she is of the opinion that it caused her alopecia Will check thyroid labs - TSH   Meds ordered this encounter  Medications  . atorvastatin (LIPITOR) 20 MG tablet    Sig: Take 1 tablet (20 mg total) by mouth daily.    Dispense:  90 tablet    Refill:  1  . hydrochlorothiazide (HYDRODIURIL) 25 MG tablet    Sig: Take 1 tablet (25 mg total) by mouth daily.    Dispense:  90 tablet    Refill:  1  . losartan (COZAAR) 100 MG tablet    Sig: Take 1 tablet (100 mg total) by mouth daily.    Dispense:  90 tablet      Refill:  1    Discontinue previous dose  . pantoprazole (PROTONIX) 40 MG tablet    Sig: Take 1 tablet (40 mg total) by mouth daily.    Dispense:  90 tablet    Refill:  1  . gabapentin (NEURONTIN) 100 MG capsule    Sig: Take 1 capsule (100 mg total) by mouth 3 (three) times daily.    Dispense:  270 capsule    Refill:  1  . naproxen (NAPROSYN) 500 MG tablet    Sig: Take 1 tablet (500 mg total) by mouth 2 (two) times daily with a meal.    Dispense:  180 tablet    Refill:  0    Follow-up: Return in  about 3 months (around 11/09/2016) for Follow-up on hypertension.   This note has been created with Surveyor, quantity. Any transcriptional errors are unintentional.    Arnoldo Morale MD

## 2016-08-10 NOTE — Progress Notes (Signed)
Pain on the heel of right foot Medication refill

## 2016-08-10 NOTE — Patient Instructions (Signed)
Hypertension Hypertension, commonly called high blood pressure, is when the force of blood pumping through your arteries is too strong. Your arteries are the blood vessels that carry blood from your heart throughout your body. A blood pressure reading consists of a higher number over a lower number, such as 110/72. The higher number (systolic) is the pressure inside your arteries when your heart pumps. The lower number (diastolic) is the pressure inside your arteries when your heart relaxes. Ideally you want your blood pressure below 120/80. Hypertension forces your heart to work harder to pump blood. Your arteries may become narrow or stiff. Having untreated or uncontrolled hypertension can cause heart attack, stroke, kidney disease, and other problems. RISK FACTORS Some risk factors for high blood pressure are controllable. Others are not.  Risk factors you cannot control include:   Race. You may be at higher risk if you are African American.  Age. Risk increases with age.  Gender. Men are at higher risk than women before age 45 years. After age 65, women are at higher risk than men. Risk factors you can control include:  Not getting enough exercise or physical activity.  Being overweight.  Getting too much fat, sugar, calories, or salt in your diet.  Drinking too much alcohol. SIGNS AND SYMPTOMS Hypertension does not usually cause signs or symptoms. Extremely high blood pressure (hypertensive crisis) may cause headache, anxiety, shortness of breath, and nosebleed. DIAGNOSIS To check if you have hypertension, your health care provider will measure your blood pressure while you are seated, with your arm held at the level of your heart. It should be measured at least twice using the same arm. Certain conditions can cause a difference in blood pressure between your right and left arms. A blood pressure reading that is higher than normal on one occasion does not mean that you need treatment. If  it is not clear whether you have high blood pressure, you may be asked to return on a different day to have your blood pressure checked again. Or, you may be asked to monitor your blood pressure at home for 1 or more weeks. TREATMENT Treating high blood pressure includes making lifestyle changes and possibly taking medicine. Living a healthy lifestyle can help lower high blood pressure. You may need to change some of your habits. Lifestyle changes may include:  Following the DASH diet. This diet is high in fruits, vegetables, and whole grains. It is low in salt, red meat, and added sugars.  Keep your sodium intake below 2,300 mg per day.  Getting at least 30-45 minutes of aerobic exercise at least 4 times per week.  Losing weight if necessary.  Not smoking.  Limiting alcoholic beverages.  Learning ways to reduce stress. Your health care provider may prescribe medicine if lifestyle changes are not enough to get your blood pressure under control, and if one of the following is true:  You are 18-59 years of age and your systolic blood pressure is above 140.  You are 60 years of age or older, and your systolic blood pressure is above 150.  Your diastolic blood pressure is above 90.  You have diabetes, and your systolic blood pressure is over 140 or your diastolic blood pressure is over 90.  You have kidney disease and your blood pressure is above 140/90.  You have heart disease and your blood pressure is above 140/90. Your personal target blood pressure may vary depending on your medical conditions, your age, and other factors. HOME CARE INSTRUCTIONS    Have your blood pressure rechecked as directed by your health care provider.   Take medicines only as directed by your health care provider. Follow the directions carefully. Blood pressure medicines must be taken as prescribed. The medicine does not work as well when you skip doses. Skipping doses also puts you at risk for  problems.  Do not smoke.   Monitor your blood pressure at home as directed by your health care provider. SEEK MEDICAL CARE IF:   You think you are having a reaction to medicines taken.  You have recurrent headaches or feel dizzy.  You have swelling in your ankles.  You have trouble with your vision. SEEK IMMEDIATE MEDICAL CARE IF:  You develop a severe headache or confusion.  You have unusual weakness, numbness, or feel faint.  You have severe chest or abdominal pain.  You vomit repeatedly.  You have trouble breathing. MAKE SURE YOU:   Understand these instructions.  Will watch your condition.  Will get help right away if you are not doing well or get worse.   This information is not intended to replace advice given to you by your health care provider. Make sure you discuss any questions you have with your health care provider.   Document Released: 10/29/2005 Document Revised: 03/15/2015 Document Reviewed: 08/21/2013 Elsevier Interactive Patient Education 2016 Elsevier Inc.  

## 2016-08-13 ENCOUNTER — Other Ambulatory Visit: Payer: Self-pay | Admitting: Family Medicine

## 2016-08-13 MED ORDER — POTASSIUM CHLORIDE ER 10 MEQ PO TBCR: 10.0000 meq | EXTENDED_RELEASE_TABLET | Freq: Every day | ORAL | 2 refills | Status: DC

## 2016-08-13 MED FILL — POTASSIUM CL 10 MEQ TAB SA: 10 | 30 days supply | Qty: 30 | Fill #0

## 2016-08-15 MED FILL — LOSARTAN POTASSIUM 100 MG T: 100 | 90 days supply | Qty: 90 | Fill #0

## 2016-08-15 MED FILL — GABAPENTIN 100 MG CAPSULE: 100 | 90 days supply | Qty: 270 | Fill #0

## 2016-08-15 MED FILL — PANTOPRAZOLE SOD DR 40 MG T: 40 | 90 days supply | Qty: 90 | Fill #0

## 2016-08-15 MED FILL — NAPROXEN 500 MG TABLET: 500 | 90 days supply | Qty: 180 | Fill #0

## 2016-08-15 MED FILL — HYDROCHLOROTHIAZIDE 25 MG T: 25 | 90 days supply | Qty: 90 | Fill #0

## 2016-08-17 ENCOUNTER — Telehealth: Payer: Self-pay

## 2016-08-17 NOTE — Telephone Encounter (Signed)
-----   Message from Arnoldo Morale, MD sent at 08/13/2016  9:06 AM EDT ----- Potasium is on the low side and so I have placed her on potassium pills; thyroid is normal.

## 2016-08-17 NOTE — Telephone Encounter (Signed)
Writer called through [employer] per Dr. Jarold Song and LVM for patient to return call for results.

## 2016-08-20 ENCOUNTER — Telehealth: Payer: Self-pay

## 2016-08-20 NOTE — Telephone Encounter (Signed)
Through [employer] VM have been left on both home and cell numbers.

## 2016-08-20 NOTE — Telephone Encounter (Signed)
-----   Message from Arnoldo Morale, MD sent at 08/13/2016  9:06 AM EDT ----- Potasium is on the low side and so I have placed her on potassium pills; thyroid is normal.

## 2016-08-23 ENCOUNTER — Telehealth: Payer: Self-pay

## 2016-08-23 NOTE — Telephone Encounter (Signed)
Writer sent a letter to patient with most recent lab results and informed her that she has a prescription for potassium at the [facility] pharmacy.

## 2016-08-23 NOTE — Telephone Encounter (Signed)
-----   Message from Arnoldo Morale, MD sent at 08/13/2016  9:06 AM EDT ----- Potasium is on the low side and so I have placed her on potassium pills; thyroid is normal.

## 2016-08-24 ENCOUNTER — Ambulatory Visit: Payer: Self-pay | Attending: Family Medicine

## 2016-10-12 MED FILL — POTASSIUM CL 10 MEQ TAB SA: 10 | 30 days supply | Qty: 30 | Fill #1

## 2016-10-26 ENCOUNTER — Ambulatory Visit: Payer: Self-pay | Attending: Family Medicine | Admitting: Family Medicine

## 2016-10-26 ENCOUNTER — Encounter: Payer: Self-pay | Admitting: Family Medicine

## 2016-10-26 VITALS — BP 120/70 | HR 52 | Temp 98.0°F | Ht 59.5 in | Wt 123.6 lb

## 2016-10-26 DIAGNOSIS — K2970 Gastritis, unspecified, without bleeding: Secondary | ICD-10-CM | POA: Insufficient documentation

## 2016-10-26 DIAGNOSIS — I10 Essential (primary) hypertension: Secondary | ICD-10-CM | POA: Insufficient documentation

## 2016-10-26 DIAGNOSIS — Z23 Encounter for immunization: Secondary | ICD-10-CM

## 2016-10-26 DIAGNOSIS — M7661 Achilles tendinitis, right leg: Secondary | ICD-10-CM

## 2016-10-26 DIAGNOSIS — Z9889 Other specified postprocedural states: Secondary | ICD-10-CM | POA: Insufficient documentation

## 2016-10-26 DIAGNOSIS — M7660 Achilles tendinitis, unspecified leg: Secondary | ICD-10-CM | POA: Insufficient documentation

## 2016-10-26 DIAGNOSIS — K219 Gastro-esophageal reflux disease without esophagitis: Secondary | ICD-10-CM | POA: Insufficient documentation

## 2016-10-26 DIAGNOSIS — E785 Hyperlipidemia, unspecified: Secondary | ICD-10-CM | POA: Insufficient documentation

## 2016-10-26 DIAGNOSIS — E876 Hypokalemia: Secondary | ICD-10-CM

## 2016-10-26 MED ORDER — POTASSIUM CHLORIDE ER 10 MEQ PO TBCR: 10.0000 meq | EXTENDED_RELEASE_TABLET | Freq: Every day | ORAL | 2 refills | Status: DC

## 2016-10-26 MED ORDER — DICLOFENAC SODIUM 50 MG PO TBEC: 50.0000 mg | DELAYED_RELEASE_TABLET | Freq: Two times a day (BID) | ORAL | 3 refills | Status: DC

## 2016-10-26 MED FILL — DICLOFENAC SOD EC 50 MG TAB: 50 | 30 days supply | Qty: 60 | Fill #0

## 2016-10-26 NOTE — Progress Notes (Signed)
Refills on potassium and diclofenac

## 2016-10-26 NOTE — Progress Notes (Signed)
Subjective:    Patient ID: Alice Woodard, female    DOB: Jun 17, 1971, 45 y.o.   MRN: [id number]  HPI 45 year old female with a history of hypertension, dyslipidemia, GERD who presents today for a follow-up visit.  She has been compliant with her antihypertensive and low-sodium diet; also taking her statin and tolerating this with no side effects. She takes diclofenac for right Achilles tendinitis which controls her symptoms but has complained of diclofenac causing alopecia at her last visit however she is still taking diclofenac and is wanting a refill.  Denies chest pains, shortness of breath, GERD, constipation, nausea or vomiting  He does not have any acute concerns today and is willing to receive the flu shot.  Past Medical History:  Diagnosis Date  . Hypertension     Past Surgical History:  Procedure Laterality Date  . ABDOMINAL SURGERY      No Known Allergies    Review of Systems  Constitutional: Negative for activity change, appetite change and fatigue.  HENT: Negative for congestion, sinus pressure and sore throat.   Eyes: Negative for visual disturbance.  Respiratory: Negative for cough, chest tightness, shortness of breath and wheezing.   Cardiovascular: Negative for chest pain and palpitations.  Gastrointestinal: Negative for abdominal distention, abdominal pain and constipation.  Endocrine: Negative for polydipsia.  Genitourinary: Negative for dysuria and frequency.  Musculoskeletal: Negative for arthralgias and back pain.  Skin: Negative for rash.  Neurological: Negative for tremors, light-headedness and numbness.  Hematological: Does not bruise/bleed easily.  Psychiatric/Behavioral: Negative for agitation and behavioral problems.       Objective: Vitals:   10/26/16 0901  BP: 120/70  Pulse: (!) 52  Temp: 98 F (36.7 C)  TempSrc: Oral  SpO2: 99%  Weight: 123 lb 9.6 oz (56.1 kg)  Height: 4' 11.5" (1.511 m)      Physical Exam  Constitutional: She  is oriented to person, place, and time. She appears well-developed and well-nourished.  Cardiovascular: Normal heart sounds and intact distal pulses.  Bradycardia present.   No murmur heard. Pulmonary/Chest: Effort normal and breath sounds normal. She has no wheezes. She has no rales. She exhibits no tenderness.  Abdominal: Soft. Bowel sounds are normal. She exhibits no distension and no mass. There is no tenderness.  Musculoskeletal: Normal range of motion.  Neurological: She is alert and oriented to person, place, and time.      CMP Latest Ref Rng & Units 08/10/2016 12/29/2015 02/03/2014  Glucose 65 - 99 mg/dL 78 82 76  BUN 7 - 25 mg/dL 12 10 13   Creatinine 0.50 - 1.10 mg/dL 0.87 0.76 0.61  Sodium 135 - 146 mmol/L 142 139 140  Potassium 3.5 - 5.3 mmol/L 3.1(L) 3.6 3.7  Chloride 98 - 110 mmol/L 103 102 105  CO2 20 - 31 mmol/L 28 29 24   Calcium 8.6 - 10.2 mg/dL 9.5 9.5 8.8  Total Protein 6.1 - 8.1 g/dL 7.5 7.4 6.5  Total Bilirubin 0.2 - 1.2 mg/dL 0.7 0.9 0.7  Alkaline Phos 33 - 115 U/L 87 114 78  AST 10 - 35 U/L 30 35 18  ALT 6 - 29 U/L 25 30(H) 10    Lipid Panel     Component Value Date/Time   CHOL 124 (L) 12/29/2015 1126   TRIG 221 (H) 12/29/2015 1126   HDL 33 (L) 12/29/2015 1126   CHOLHDL 3.8 12/29/2015 1126   VLDL 44 (H) 12/29/2015 1126   LDLCALC 47 12/29/2015 1126       Assessment &  Plan:  1. Dyslipidemia Hypertriglyceridemia Advised on low cholesterol diet A repeat panel today - atorvastatin (LIPITOR) 20 MG tablet; Take 1 tablet (20 mg total) by mouth daily.  Dispense: 90 tablet; Refill: 1  2. Essential hypertension, benign Controlled Raised dose of Losartan - hydrochlorothiazide (HYDRODIURIL) 25 MG tablet; Take 1 tablet (25 mg total) by mouth daily.  Dispense: 90 tablet; Refill: 1 - losartan (COZAAR) 100 MG tablet; Take 1 tablet (100 mg total) by mouth daily.  Dispense: 90 tablet; Refill: 1 - COMPLETE METABOLIC PANEL WITH GFR  3. Gastritis and  gastroduodenitis Controlled - pantoprazole (PROTONIX) 40 MG tablet; Take 1 tablet (40 mg total) by mouth daily.  Dispense: 90 tablet; Refill: 1  4. Achilles tendinitis of right lower extremity Controlled Continue diclofenac

## 2016-10-27 LAB — BASIC METABOLIC PANEL
BUN: 11 mg/dL (ref 7–25)
CALCIUM: 9.7 mg/dL (ref 8.6–10.2)
CO2: 28 mmol/L (ref 20–31)
CREATININE: 0.99 mg/dL (ref 0.50–1.10)
Chloride: 104 mmol/L (ref 98–110)
GLUCOSE: 72 mg/dL (ref 65–99)
Potassium: 3.7 mmol/L (ref 3.5–5.3)
SODIUM: 140 mmol/L (ref 135–146)

## 2016-10-27 LAB — LIPID PANEL
CHOL/HDL RATIO: 4.8 ratio (ref <5.0)
Cholesterol: 214 mg/dL — ABNORMAL HIGH (ref <200)
HDL: 45 mg/dL — ABNORMAL LOW (ref >50)
LDL Cholesterol: 147 mg/dL — ABNORMAL HIGH (ref <100)
Triglycerides: 112 mg/dL (ref <150)
VLDL: 22 mg/dL (ref <30)

## 2016-11-02 ENCOUNTER — Telehealth: Payer: Self-pay

## 2016-11-02 NOTE — Telephone Encounter (Signed)
-----   Message from Arnoldo Morale, MD sent at 10/29/2016  3:06 PM EST ----- Cholesterol is elevated compared to last set of labs. Advise on compliance with atorvastatin, low-cholesterol diet and exercise.

## 2016-11-02 NOTE — Telephone Encounter (Signed)
Through [employer] was able to contact patient and discuss lab results.  Patient stated understanding and will pick up her lipitor prescribed by MD.

## 2016-11-13 MED FILL — LOSARTAN POTASSIUM 100 MG T: 100 | 90 days supply | Qty: 90 | Fill #1

## 2016-11-13 MED FILL — HYDROCHLOROTHIAZIDE 25 MG T: 25 | 90 days supply | Qty: 90 | Fill #1

## 2016-11-13 MED FILL — PANTOPRAZOLE SOD DR 40 MG T: 40 | 90 days supply | Qty: 90 | Fill #1

## 2016-11-27 ENCOUNTER — Ambulatory Visit: Payer: Self-pay

## 2016-11-30 ENCOUNTER — Other Ambulatory Visit: Payer: Self-pay | Admitting: Family Medicine

## 2016-11-30 DIAGNOSIS — M7661 Achilles tendinitis, right leg: Secondary | ICD-10-CM

## 2016-12-28 ENCOUNTER — Ambulatory Visit: Payer: Self-pay | Attending: Family Medicine

## 2017-01-18 MED FILL — GABAPENTIN 100 MG CAPSULE: 100 | 90 days supply | Qty: 270 | Fill #1

## 2017-01-25 ENCOUNTER — Encounter: Payer: Self-pay | Admitting: Family Medicine

## 2017-01-25 ENCOUNTER — Ambulatory Visit: Payer: Self-pay | Attending: Family Medicine | Admitting: Family Medicine

## 2017-01-25 ENCOUNTER — Ambulatory Visit: Payer: Self-pay

## 2017-01-25 VITALS — BP 160/108 | HR 57 | Temp 98.0°F | Ht 60.0 in | Wt 121.6 lb

## 2017-01-25 DIAGNOSIS — I10 Essential (primary) hypertension: Secondary | ICD-10-CM | POA: Insufficient documentation

## 2017-01-25 DIAGNOSIS — M7660 Achilles tendinitis, unspecified leg: Secondary | ICD-10-CM | POA: Insufficient documentation

## 2017-01-25 DIAGNOSIS — K2990 Gastroduodenitis, unspecified, without bleeding: Secondary | ICD-10-CM

## 2017-01-25 DIAGNOSIS — Z79899 Other long term (current) drug therapy: Secondary | ICD-10-CM | POA: Insufficient documentation

## 2017-01-25 DIAGNOSIS — G5600 Carpal tunnel syndrome, unspecified upper limb: Secondary | ICD-10-CM | POA: Insufficient documentation

## 2017-01-25 DIAGNOSIS — G5601 Carpal tunnel syndrome, right upper limb: Secondary | ICD-10-CM | POA: Insufficient documentation

## 2017-01-25 DIAGNOSIS — K2970 Gastritis, unspecified, without bleeding: Secondary | ICD-10-CM | POA: Insufficient documentation

## 2017-01-25 DIAGNOSIS — E785 Hyperlipidemia, unspecified: Secondary | ICD-10-CM | POA: Insufficient documentation

## 2017-01-25 DIAGNOSIS — M7661 Achilles tendinitis, right leg: Secondary | ICD-10-CM

## 2017-01-25 DIAGNOSIS — K219 Gastro-esophageal reflux disease without esophagitis: Secondary | ICD-10-CM | POA: Insufficient documentation

## 2017-01-25 DIAGNOSIS — Z9889 Other specified postprocedural states: Secondary | ICD-10-CM | POA: Insufficient documentation

## 2017-01-25 LAB — COMPLETE METABOLIC PANEL WITH GFR
ALT: 12 U/L (ref 6–29)
AST: 20 U/L (ref 10–35)
Albumin: 4.1 g/dL (ref 3.6–5.1)
Alkaline Phosphatase: 105 U/L (ref 33–115)
BUN: 12 mg/dL (ref 7–25)
CALCIUM: 9.3 mg/dL (ref 8.6–10.2)
CHLORIDE: 107 mmol/L (ref 98–110)
CO2: 28 mmol/L (ref 20–31)
Creat: 0.79 mg/dL (ref 0.50–1.10)
GFR, Est African American: >89 mL/min (ref >=60)
GFR, Est Non African American: 89 mL/min (ref >=60)
Glucose, Bld: 79 mg/dL (ref 65–99)
Potassium: 3.7 mmol/L (ref 3.5–5.3)
SODIUM: 142 mmol/L (ref 135–146)
Total Bilirubin: 0.9 mg/dL (ref 0.2–1.2)
Total Protein: 7.3 g/dL (ref 6.1–8.1)

## 2017-01-25 MED ORDER — LOSARTAN POTASSIUM 100 MG PO TABS: 100.0000 mg | ORAL_TABLET | Freq: Every day | ORAL | 1 refills | Status: DC

## 2017-01-25 MED ORDER — AMLODIPINE BESYLATE 10 MG PO TABS: 10.0000 mg | ORAL_TABLET | Freq: Every day | ORAL | 1 refills | Status: DC

## 2017-01-25 MED ORDER — GABAPENTIN 300 MG PO CAPS: 300.0000 mg | ORAL_CAPSULE | Freq: Three times a day (TID) | ORAL | 3 refills | Status: DC

## 2017-01-25 MED ORDER — ATORVASTATIN CALCIUM 20 MG PO TABS: 20.0000 mg | ORAL_TABLET | Freq: Every day | ORAL | 1 refills | Status: DC

## 2017-01-25 MED ORDER — NAPROXEN 500 MG PO TABS: 500.0000 mg | ORAL_TABLET | Freq: Two times a day (BID) | ORAL | 3 refills | Status: DC

## 2017-01-25 MED ORDER — HYDROCHLOROTHIAZIDE 25 MG PO TABS: 25.0000 mg | ORAL_TABLET | Freq: Every day | ORAL | 1 refills | Status: DC

## 2017-01-25 MED ORDER — PREDNISONE 20 MG PO TABS: 20.0000 mg | ORAL_TABLET | Freq: Every day | ORAL | 0 refills | Status: DC

## 2017-01-25 MED ORDER — PANTOPRAZOLE SODIUM 40 MG PO TBEC: 40.0000 mg | DELAYED_RELEASE_TABLET | Freq: Every day | ORAL | 1 refills | Status: DC

## 2017-01-25 MED FILL — ?PREDNISONE 20 MG TABLET: 20 | 5 days supply | Qty: 5 | Fill #0

## 2017-01-25 MED FILL — ?NAPROXEN 500 MG TAB: 500 MG | 30 days supply | Qty: 60 | Fill #0

## 2017-01-25 MED FILL — ?AMLODIPINE BESYLATE 10 MG: 10 | 30 days supply | Qty: 30 | Fill #0

## 2017-01-25 MED FILL — GABAPENTIN 300 MG CAPSULE: 300 | 30 days supply | Qty: 90 | Fill #0

## 2017-01-25 MED FILL — ATORVASTATIN 20 MG TABLET: 20 | 30 days supply | Qty: 30 | Fill #0

## 2017-01-25 NOTE — Progress Notes (Signed)
Need refills- is a bit confused about her meds

## 2017-01-25 NOTE — Progress Notes (Signed)
Subjective:  Patient ID: Alice Woodard, female    DOB: 1970/12/15  Age: 46 y.o. MRN: [id number]  CC: Hypertension; Foot Pain (right sided); and Wrist Pain (right sided)   HPI Alice Woodard with a history of hypertension, dyslipidemia, GERD who presents today for a follow-up visit.  She has been compliant with her antihypertensive and low-sodium diet; also taking her statin and tolerating this with no side effects. She takes naproxen for right Achilles tendinitis which does not control her symptoms . He is described as moderate. She also complains of pain in her right wrist which shoots down to her fingers with associated numbness. Denies a history of dropping things.  Gastroesophageal reflux disease is controlled on current medications.  Denies chest pains, shortness of breath, constipation, nausea or vomiting    Past Medical History:  Diagnosis Date  . Hypertension     Past Surgical History:  Procedure Laterality Date  . ABDOMINAL SURGERY      No Known Allergies   Outpatient Medications Prior to Visit  Medication Sig Dispense Refill  . gabapentin (NEURONTIN) 100 MG capsule Take 1 capsule (100 mg total) by mouth 3 (three) times daily. 270 capsule 1  . hydrochlorothiazide (HYDRODIURIL) 25 MG tablet Take 1 tablet (25 mg total) by mouth daily. 90 tablet 1  . losartan (COZAAR) 100 MG tablet Take 1 tablet (100 mg total) by mouth daily. 90 tablet 1  . pantoprazole (PROTONIX) 40 MG tablet Take 1 tablet (40 mg total) by mouth daily. 90 tablet 1  . potassium chloride (K-DUR) 10 MEQ tablet Take 1 tablet (10 mEq total) by mouth daily. (Patient not taking: Reported on 01/25/2017) 30 tablet 2  . atorvastatin (LIPITOR) 20 MG tablet Take 1 tablet (20 mg total) by mouth daily. (Patient not taking: Reported on 10/26/2016) 90 tablet 1  . diclofenac (VOLTAREN) 50 MG EC tablet Take 1 tablet (50 mg total) by mouth 2 (two) times daily. (Patient not taking: Reported on 01/25/2017) 60 tablet 3  .  Vitamin D, Ergocalciferol, (DRISDOL) 50000 units CAPS capsule Take 1 capsule (50,000 Units total) by mouth every 7 (seven) days. (Patient not taking: Reported on 10/26/2016) 12 capsule 3   No facility-administered medications prior to visit.     ROS Review of Systems  Constitutional: Negative for activity change, appetite change and fatigue.  HENT: Negative for congestion, sinus pressure and sore throat.   Eyes: Negative for visual disturbance.  Respiratory: Negative for cough, chest tightness, shortness of breath and wheezing.   Cardiovascular: Negative for chest pain and palpitations.  Gastrointestinal: Negative for abdominal distention, abdominal pain and constipation.  Endocrine: Negative for polydipsia.  Genitourinary: Negative for dysuria and frequency.  Musculoskeletal:       See hpi  Skin: Negative for rash.  Neurological: Negative for tremors, light-headedness and numbness.  Hematological: Does not bruise/bleed easily.  Psychiatric/Behavioral: Negative for agitation and behavioral problems.    Objective:  BP (!) 160/108 (BP Location: Left Arm, Cuff Size: Large)   Pulse (!) 57   Temp 98 F (36.7 C) (Oral)   Ht 5' (1.524 m)   Wt 121 lb 9.6 oz (55.2 kg)   BMI 23.75 kg/m   BP/Weight 01/25/2017 10/26/2016 12/26/863  Systolic BP 784 696 295  Diastolic BP 284 70 83  Wt. (Lbs) 121.6 123.6 120  BMI 23.75 24.55 23.63      Physical Exam  Constitutional: She is oriented to person, place, and time. She appears well-developed and well-nourished.  HENT:  Right Ear:  External ear normal.  Left Ear: External ear normal.  Cardiovascular: Normal rate, normal heart sounds and intact distal pulses.   No murmur heard. Pulmonary/Chest: Effort normal and breath sounds normal. She has no wheezes. She has no rales. She exhibits no tenderness.  Abdominal: Soft. Bowel sounds are normal. She exhibits no distension and no mass. There is no tenderness.  Musculoskeletal:  Right  foot:Tenderness on palpation of the Achilles tendon and dorsiflexion Left foot: Normal Wrist: Normal range of motion bilaterally, negative Tinel's and Phalen's sign  Neurological: She is alert and oriented to person, place, and time.  Skin: Skin is warm and dry.  Psychiatric: She has a normal mood and affect.   Lipid Panel     Component Value Date/Time   CHOL 214 (H) 10/26/2016 0933   TRIG 112 10/26/2016 0933   HDL 45 (L) 10/26/2016 0933   CHOLHDL 4.8 10/26/2016 0933   VLDL 22 10/26/2016 0933   LDLCALC 147 (H) 10/26/2016 0933    CMP Latest Ref Rng & Units 01/25/2017 10/26/2016 08/10/2016  Glucose 65 - 99 mg/dL 79 72 78  BUN 7 - 25 mg/dL 12 11 12   Creatinine 0.50 - 1.10 mg/dL 0.79 0.99 0.87  Sodium 135 - 146 mmol/L 142 140 142  Potassium 3.5 - 5.3 mmol/L 3.7 3.7 3.1(L)  Chloride 98 - 110 mmol/L 107 104 103  CO2 20 - 31 mmol/L 28 28 28   Calcium 8.6 - 10.2 mg/dL 9.3 9.7 9.5  Total Protein 6.1 - 8.1 g/dL 7.3 - 7.5  Total Bilirubin 0.2 - 1.2 mg/dL 0.9 - 0.7  Alkaline Phos 33 - 115 U/L 105 - 87  AST 10 - 35 U/L 20 - 30  ALT 6 - 29 U/L 12 - 25     Assessment & Plan:   1. Carpal tunnel syndrome of right wrist Use wrist brace - gabapentin (NEURONTIN) 300 MG capsule; Take 1 capsule (300 mg total) by mouth 3 (three) times daily.  Dispense: 60 capsule; Refill: 3  2. Dyslipidemia Controlled Low-cholesterol diet - atorvastatin (LIPITOR) 20 MG tablet; Take 1 tablet (20 mg total) by mouth daily.  Dispense: 90 tablet; Refill: 1  3. Essential hypertension, benign Uncontrolled Amlodipine added to her regimen Low-sodium, low-cholesterol diet - hydrochlorothiazide (HYDRODIURIL) 25 MG tablet; Take 1 tablet (25 mg total) by mouth daily.  Dispense: 90 tablet; Refill: 1 - losartan (COZAAR) 100 MG tablet; Take 1 tablet (100 mg total) by mouth daily.  Dispense: 90 tablet; Refill: 1 - amLODipine (NORVASC) 10 MG tablet; Take 1 tablet (10 mg total) by mouth daily.  Dispense: 90 tablet; Refill:  1 - COMPLETE METABOLIC PANEL WITH GFR  4. Gastritis and gastroduodenitis Stable - pantoprazole (PROTONIX) 40 MG tablet; Take 1 tablet (40 mg total) by mouth daily.  Dispense: 90 tablet; Refill: 1  5. Achilles tendinitis of right lower extremity Uncontrolled Short course of prednisone - predniSONE (DELTASONE) 20 MG tablet; Take 1 tablet (20 mg total) by mouth daily with breakfast.  Dispense: 5 tablet; Refill: 0 - naproxen (NAPROSYN) 500 MG tablet; Take 1 tablet (500 mg total) by mouth 2 (two) times daily with a meal.  Dispense: 60 tablet; Refill: 3   Meds ordered this encounter  Medications  . gabapentin (NEURONTIN) 300 MG capsule    Sig: Take 1 capsule (300 mg total) by mouth 3 (three) times daily.    Dispense:  60 capsule    Refill:  3    Discontinue previous dose  . atorvastatin (LIPITOR) 20 MG  tablet    Sig: Take 1 tablet (20 mg total) by mouth daily.    Dispense:  90 tablet    Refill:  1  . hydrochlorothiazide (HYDRODIURIL) 25 MG tablet    Sig: Take 1 tablet (25 mg total) by mouth daily.    Dispense:  90 tablet    Refill:  1  . pantoprazole (PROTONIX) 40 MG tablet    Sig: Take 1 tablet (40 mg total) by mouth daily.    Dispense:  90 tablet    Refill:  1  . losartan (COZAAR) 100 MG tablet    Sig: Take 1 tablet (100 mg total) by mouth daily.    Dispense:  90 tablet    Refill:  1    Discontinue previous dose  . amLODipine (NORVASC) 10 MG tablet    Sig: Take 1 tablet (10 mg total) by mouth daily.    Dispense:  90 tablet    Refill:  1  . predniSONE (DELTASONE) 20 MG tablet    Sig: Take 1 tablet (20 mg total) by mouth daily with breakfast.    Dispense:  5 tablet    Refill:  0  . naproxen (NAPROSYN) 500 MG tablet    Sig: Take 1 tablet (500 mg total) by mouth 2 (two) times daily with a meal.    Dispense:  60 tablet    Refill:  3    Follow-up: Return in about 3 months (around 04/27/2017) for follow up on chronic medical conditions.   Arnoldo Morale MD

## 2017-01-25 NOTE — Patient Instructions (Signed)
Achilles Tendinitis  Achilles tendinitis is inflammation of the tough, cord-like band that attaches the lower leg muscles to the heel bone (Achilles tendon). This is usually caused by overusing the tendon and the ankle joint.  Achilles tendinitis usually gets better over time with treatment and caring for yourself at home. It can take weeks or months to heal completely.  What are the causes?  This condition may be caused by:  · A sudden increase in exercise or activity, such as running.  · Doing the same exercises or activities (such as jumping) over and over.  · Not warming up calf muscles before exercising.  · Exercising in shoes that are worn out or not made for exercise.  · Having arthritis or a bone growth (spur) on the back of the heel bone. This can rub against the tendon and hurt it.  · Age-related wear and tear. Tendons become less flexible with age and more likely to be injured.    What are the signs or symptoms?  Common symptoms of this condition include:  · Pain in the Achilles tendon or in the back of the leg, just above the heel. The pain usually gets worse with exercise.  · Stiffness or soreness in the back of the leg, especially in the morning.  · Swelling of the skin over the Achilles tendon.  · Thickening of the tendon.  · Bone spurs at the bottom of the Achilles tendon, near the heel.  · Trouble standing on tiptoe.    How is this diagnosed?  This condition is diagnosed based on your symptoms and a physical exam. You may have tests, including:  · X-rays.  · MRI.    How is this treated?  The goal of treatment is to relieve symptoms and help your injury heal. Treatment may include:  · Decreasing or stopping activities that caused the tendinitis. This may mean switching to low-impact exercises like biking or swimming.  · Icing the injured area.  · Doing physical therapy, including strengthening and stretching exercises.  · NSAIDs to help relieve pain and swelling.  · Using supportive shoes, wraps,  heel lifts, or a walking boot (air cast).  · Surgery. This may be done if your symptoms do not improve after 6 months.  · Using high-energy shock wave impulses to stimulate the healing process (extracorporeal shock wave therapy). This is rare.  · Injection of medicines to help relieve inflammation (corticosteroids). This is rare.    Follow these instructions at home:  If you have an air cast:   · Wear the cast as told by your health care provider. Remove it only as told by your health care provider.  · Loosen the cast if your toes tingle, become numb, or turn cold and blue.  Activity   · Gradually return to your normal activities once your health care provider approves. Do not do activities that cause pain.  ? Consider doing low-impact exercises, like cycling or swimming.  · If you have an air cast, ask your health care provider when it is safe for you to drive.  · If physical therapy was prescribed, do exercises as told by your health care provider or physical therapist.  Managing pain, stiffness, and swelling   · Raise (elevate) your foot above the level of your heart while you are sitting or lying down.  · Move your toes often to avoid stiffness and to lessen swelling.  · If directed, put ice on the injured area:  ?   Put ice in a plastic bag.  ? Place a towel between your skin and the bag.  ? Leave the ice on for 20 minutes, 2-3 times a day  General instructions   · If directed, wrap your foot with an elastic bandage or other wrap. This can help keep your tendon from moving too much while it heals. Your health care provider will show you how to wrap your foot correctly.  · Wear supportive shoes or heel lifts only as told by your health care provider.  · Take over-the-counter and prescription medicines only as told by your health care provider.  · Keep all follow-up visits as told by your health care provider. This is important.  Contact a health care provider if:  · You have symptoms that gets worse.  · You have  pain that does not get better with medicine.  · You develop new, unexplained symptoms.  · You develop warmth and swelling in your foot.  · You have a fever.  Get help right away if:  · You have a sudden popping sound or sensation in your Achilles tendon followed by severe pain.  · You cannot move your toes or foot.  · You cannot put any weight on your foot.  Summary  · Achilles tendinitis is inflammation of the tough, cord-like band that attaches the lower leg muscles to the heel bone (Achilles tendon).  · This condition is usually caused by overusing the tendon and the ankle joint. It can also be caused by arthritis or normal aging.  · The most common symptoms of this condition include pain, swelling, or stiffness in the Achilles tendon or in the back of the leg.  · This condition is usually treated with rest, NSAIDs, and physical therapy.  This information is not intended to replace advice given to you by your health care provider. Make sure you discuss any questions you have with your health care provider.  Document Released: 08/08/2005 Document Revised: 09/17/2016 Document Reviewed: 09/17/2016  Elsevier Interactive Patient Education © 2017 [employer]

## 2017-02-11 ENCOUNTER — Telehealth: Payer: Self-pay

## 2017-02-11 NOTE — Telephone Encounter (Signed)
Through [employer] tried to contact patient.  LVM letting patient know that she was trying to be reached.

## 2017-02-11 NOTE — Telephone Encounter (Signed)
-----   Message from Arnoldo Morale, MD sent at 01/25/2017  4:54 PM EDT ----- Please inform the patient that labs are normal. Thank you.

## 2017-02-14 NOTE — Progress Notes (Signed)
After several attempts to call patient with lab results writer copied lab results and attached the results to a letter and sent it to the patient.

## 2017-02-22 ENCOUNTER — Ambulatory Visit: Payer: Self-pay | Attending: Family Medicine

## 2017-03-25 MED FILL — ATORVASTATIN 20 MG TABLET: 20 | 30 days supply | Qty: 30 | Fill #1

## 2017-03-25 MED FILL — NAPROXEN 500 MG TABLET: 500 | 30 days supply | Qty: 60 | Fill #1

## 2017-04-12 HISTORY — PX: UPPER GASTROINTESTINAL ENDOSCOPY: SHX188

## 2017-04-17 ENCOUNTER — Emergency Department (HOSPITAL_COMMUNITY): Payer: Self-pay

## 2017-04-17 ENCOUNTER — Inpatient Hospital Stay (HOSPITAL_COMMUNITY)
Admission: EM | Admit: 2017-04-17 | Discharge: 2017-04-18 | DRG: 379 | Disposition: A | Discharge status: Home / Self Care | Payer: Self-pay | Attending: Internal Medicine | Admitting: Internal Medicine

## 2017-04-17 ENCOUNTER — Encounter (HOSPITAL_COMMUNITY): Payer: Self-pay

## 2017-04-17 DIAGNOSIS — K2970 Gastritis, unspecified, without bleeding: Secondary | ICD-10-CM

## 2017-04-17 DIAGNOSIS — Z791 Long term (current) use of non-steroidal anti-inflammatories (NSAID): Secondary | ICD-10-CM

## 2017-04-17 DIAGNOSIS — D500 Iron deficiency anemia secondary to blood loss (chronic): Secondary | ICD-10-CM | POA: Diagnosis present

## 2017-04-17 DIAGNOSIS — R110 Nausea: Secondary | ICD-10-CM

## 2017-04-17 DIAGNOSIS — K921 Melena: Secondary | ICD-10-CM

## 2017-04-17 DIAGNOSIS — K264 Chronic or unspecified duodenal ulcer with hemorrhage: Principal | ICD-10-CM | POA: Diagnosis present

## 2017-04-17 DIAGNOSIS — Z9289 Personal history of other medical treatment: Secondary | ICD-10-CM

## 2017-04-17 DIAGNOSIS — E876 Hypokalemia: Secondary | ICD-10-CM | POA: Diagnosis present

## 2017-04-17 DIAGNOSIS — I10 Essential (primary) hypertension: Secondary | ICD-10-CM | POA: Diagnosis present

## 2017-04-17 DIAGNOSIS — K922 Gastrointestinal hemorrhage, unspecified: Secondary | ICD-10-CM

## 2017-04-17 DIAGNOSIS — E785 Hyperlipidemia, unspecified: Secondary | ICD-10-CM | POA: Diagnosis present

## 2017-04-17 DIAGNOSIS — K219 Gastro-esophageal reflux disease without esophagitis: Secondary | ICD-10-CM | POA: Diagnosis present

## 2017-04-17 DIAGNOSIS — R0602 Shortness of breath: Secondary | ICD-10-CM

## 2017-04-17 DIAGNOSIS — K269 Duodenal ulcer, unspecified as acute or chronic, without hemorrhage or perforation: Secondary | ICD-10-CM

## 2017-04-17 DIAGNOSIS — D649 Anemia, unspecified: Secondary | ICD-10-CM

## 2017-04-17 DIAGNOSIS — K2990 Gastroduodenitis, unspecified, without bleeding: Secondary | ICD-10-CM

## 2017-04-17 DIAGNOSIS — Z79899 Other long term (current) drug therapy: Secondary | ICD-10-CM

## 2017-04-17 DIAGNOSIS — M7660 Achilles tendinitis, unspecified leg: Secondary | ICD-10-CM | POA: Diagnosis present

## 2017-04-17 DIAGNOSIS — R1012 Left upper quadrant pain: Secondary | ICD-10-CM

## 2017-04-17 DIAGNOSIS — B9681 Helicobacter pylori [H. pylori] as the cause of diseases classified elsewhere: Secondary | ICD-10-CM | POA: Diagnosis present

## 2017-04-17 HISTORY — DX: Anemia, unspecified: D64.9

## 2017-04-17 HISTORY — DX: Personal history of other medical treatment: Z92.89

## 2017-04-17 HISTORY — DX: Hyperlipidemia, unspecified: E78.5

## 2017-04-17 LAB — BASIC METABOLIC PANEL
Anion gap: 7 (ref 5–15)
BUN: 30 mg/dL — AB (ref 6–20)
CALCIUM: 8.2 mg/dL — AB (ref 8.9–10.3)
CO2: 24 mmol/L (ref 22–32)
CREATININE: 0.68 mg/dL (ref 0.44–1.00)
Chloride: 109 mmol/L (ref 101–111)
GFR calc non Af Amer: >60 mL/min (ref >60)
Glucose, Bld: 99 mg/dL (ref 65–99)
Potassium: 3.4 mmol/L — ABNORMAL LOW (ref 3.5–5.1)
SODIUM: 140 mmol/L (ref 135–145)

## 2017-04-17 LAB — HEPATIC FUNCTION PANEL
ALBUMIN: 2.8 g/dL — AB (ref 3.5–5.0)
ALK PHOS: 67 U/L (ref 38–126)
ALT: 12 U/L — AB (ref 14–54)
AST: 19 U/L (ref 15–41)
Bilirubin, Direct: 0.1 mg/dL (ref 0.1–0.5)
Indirect Bilirubin: 0.9 mg/dL (ref 0.3–0.9)
TOTAL PROTEIN: 5 g/dL — AB (ref 6.5–8.1)
Total Bilirubin: 1 mg/dL (ref 0.3–1.2)

## 2017-04-17 LAB — ABO/RH: ABO/RH(D): A POS

## 2017-04-17 LAB — RETICULOCYTES
RBC.: 2.54 MIL/uL — ABNORMAL LOW (ref 3.87–5.11)
Retic Count, Absolute: 119.4 10*3/uL (ref 19.0–186.0)
Retic Ct Pct: 4.7 % — ABNORMAL HIGH (ref 0.4–3.1)

## 2017-04-17 LAB — HEMOGLOBIN AND HEMATOCRIT, BLOOD
HCT: 22.8 % — ABNORMAL LOW (ref 36.0–46.0)
Hemoglobin: 7.7 g/dL — ABNORMAL LOW (ref 12.0–15.0)

## 2017-04-17 LAB — CBC
HCT: 14.6 % — ABNORMAL LOW (ref 36.0–46.0)
Hemoglobin: 4.8 g/dL — CL (ref 12.0–15.0)
MCH: 30.2 pg (ref 26.0–34.0)
MCHC: 32.9 g/dL (ref 30.0–36.0)
MCV: 91.8 fL (ref 78.0–100.0)
PLATELETS: 238 10*3/uL (ref 150–400)
RBC: 1.59 MIL/uL — AB (ref 3.87–5.11)
RDW: 15.8 % — AB (ref 11.5–15.5)
WBC: 8.1 10*3/uL (ref 4.0–10.5)

## 2017-04-17 LAB — PREPARE RBC (CROSSMATCH)

## 2017-04-17 LAB — I-STAT TROPONIN, ED: TROPONIN I, POC: 0 ng/mL (ref 0.00–0.08)

## 2017-04-17 LAB — D-DIMER, QUANTITATIVE (NOT AT ARMC): D DIMER QUANT: 0.62 ug{FEU}/mL — AB (ref 0.00–0.50)

## 2017-04-17 LAB — POC OCCULT BLOOD, ED: FECAL OCCULT BLD: POSITIVE — AB

## 2017-04-17 MED ORDER — GABAPENTIN 300 MG PO CAPS
300.0000 mg | ORAL_CAPSULE | Freq: Three times a day (TID) | ORAL | Status: DC
  Administered 2017-04-17 – 2017-04-18 (×2): 300 mg via ORAL
  Filled 2017-04-17 (×2): qty 1

## 2017-04-17 MED ORDER — PANTOPRAZOLE SODIUM 40 MG IV SOLR: 40.0000 mg | INTRAVENOUS | Status: DC

## 2017-04-17 MED ORDER — ACETAMINOPHEN 325 MG PO TABS: 650.0000 mg | ORAL_TABLET | Freq: Four times a day (QID) | ORAL | Status: DC | PRN

## 2017-04-17 MED ORDER — BOOST / RESOURCE BREEZE PO LIQD
1.0000 | Freq: Three times a day (TID) | ORAL | Status: DC
  Administered 2017-04-17 – 2017-04-18 (×2): 1 via ORAL

## 2017-04-17 MED ORDER — ASPIRIN 81 MG PO CHEW: 324.0000 mg | CHEWABLE_TABLET | Freq: Once | ORAL | Status: DC

## 2017-04-17 MED ORDER — ACETAMINOPHEN 650 MG RE SUPP: 650.0000 mg | Freq: Four times a day (QID) | RECTAL | Status: DC | PRN

## 2017-04-17 MED ORDER — PANTOPRAZOLE SODIUM 40 MG IV SOLR
40.0000 mg | Freq: Two times a day (BID) | INTRAVENOUS | Status: DC
  Administered 2017-04-17 – 2017-04-18 (×2): 40 mg via INTRAVENOUS
  Filled 2017-04-17 (×2): qty 40

## 2017-04-17 MED ORDER — SODIUM CHLORIDE 0.9 % IV SOLN
10.0000 mL/h | Freq: Once | INTRAVENOUS | Status: AC
  Administered 2017-04-17: 10 mL/h via INTRAVENOUS

## 2017-04-17 MED ORDER — ATORVASTATIN CALCIUM 20 MG PO TABS: 20.0000 mg | ORAL_TABLET | Freq: Every day | ORAL | Status: DC

## 2017-04-17 NOTE — ED Notes (Signed)
secon iv attempted x 2 without success.

## 2017-04-17 NOTE — ED Notes (Signed)
DR Little notified of HGB

## 2017-04-17 NOTE — ED Provider Notes (Signed)
[facility] DEPT Provider Note   CSN: [id number] Arrival date & time: 04/17/17  0702     History   Chief Complaint Chief Complaint  Patient presents with  . Shortness of Breath  . Chest Pain    HPI Alice Woodard is a 46 y.o. female.  46yo F w/ PMH including HTN, HLD who p/w shortness of breath and chest pain. She reports sudden onset of SOB beginning this morning associated with constant, dull, mild heavy chest pain. SOB is worse with exertion but no change with positioning. She reports difficulty sleeping for the past 3-4 days. She reports associated anxiety and tingling of fingers and legs. She states she has had anxiety before but denies any history of SOB/CP like this. She reports associated nausea but no cough, fever, vomiting, or recent illness. No leg swelling/pain, recent travel, h/o cancer, or family hx of heart disease. No tobacco or illicit drug use.    The history is provided by the patient. A language interpreter was used.  Shortness of Breath  Associated symptoms include chest pain.  Chest Pain   Associated symptoms include shortness of breath.    Past Medical History:  Diagnosis Date  . Hyperlipidemia   . Hypertension     Patient Active Problem List   Diagnosis Date Noted  . Carpal tunnel syndrome 01/25/2017  . Gastritis and gastroduodenitis 05/04/2016  . Achilles tendinitis of right lower extremity 05/04/2016  . Healthcare maintenance 12/29/2015  . SOB (shortness of breath) 12/29/2015  . Blurry vision, bilateral 11/18/2014  . Dyslipidemia 05/24/2014  . Essential hypertension, benign 05/24/2014    Past Surgical History:  Procedure Laterality Date  . ABDOMINAL SURGERY    . APPENDECTOMY    . CHOLECYSTECTOMY      OB History    No data available       Home Medications    Prior to Admission medications   Medication Sig Start Date End Date Taking? Authorizing Provider  amLODipine (NORVASC) 10 MG tablet Take 1 tablet (10 mg total) by mouth  daily. 01/25/17  Yes Arnoldo Morale, MD  atorvastatin (LIPITOR) 20 MG tablet Take 1 tablet (20 mg total) by mouth daily. 01/25/17  Yes Arnoldo Morale, MD  gabapentin (NEURONTIN) 300 MG capsule Take 1 capsule (300 mg total) by mouth 3 (three) times daily. 01/25/17  Yes Arnoldo Morale, MD  hydrochlorothiazide (HYDRODIURIL) 25 MG tablet Take 1 tablet (25 mg total) by mouth daily. 01/25/17  Yes Arnoldo Morale, MD  naproxen (NAPROSYN) 500 MG tablet Take 1 tablet (500 mg total) by mouth 2 (two) times daily with a meal. 01/25/17  Yes Amao, Enobong, MD  losartan (COZAAR) 100 MG tablet Take 1 tablet (100 mg total) by mouth daily. Patient not taking: Reported on 04/17/2017 01/25/17   Arnoldo Morale, MD  pantoprazole (PROTONIX) 40 MG tablet Take 1 tablet (40 mg total) by mouth daily. Patient not taking: Reported on 04/17/2017 01/25/17   Arnoldo Morale, MD  potassium chloride (K-DUR) 10 MEQ tablet Take 1 tablet (10 mEq total) by mouth daily. Patient not taking: Reported on 01/25/2017 10/26/16   Arnoldo Morale, MD    Family History No family history on file.  Social History Social History  Substance Use Topics  . Smoking status: Never Smoker  . Smokeless tobacco: Never Used  . Alcohol use No     Allergies   Amlodipine and Atorvastatin   Review of Systems Review of Systems  Respiratory: Positive for shortness of breath.   Cardiovascular: Positive for chest pain.  All other systems reviewed and are negative except that which was mentioned in HPI   Physical Exam Updated Vital Signs BP 135/81   Pulse 81   Temp 97.9 F (36.6 C) (Oral)   Resp (!) 21   Ht 5\' 1"  (1.549 m)   Wt 59 kg (130 lb)   LMP 11/15/2014   SpO2 100%   BMI 24.56 kg/m   Physical Exam  Constitutional: She is oriented to person, place, and time. She appears well-developed and well-nourished. No distress.  HENT:  Head: Normocephalic and atraumatic.  Moist mucous membranes  Eyes: Conjunctivae are normal. Pupils are equal, round, and  reactive to light.  Neck: Neck supple.  Cardiovascular: Normal rate and regular rhythm.   Murmur heard.  Systolic murmur is present with a grade of 2/6  Pulmonary/Chest: Effort normal and breath sounds normal.  Abdominal: Soft. Bowel sounds are normal. She exhibits no distension. There is no tenderness.  Musculoskeletal: She exhibits no edema.  Neurological: She is alert and oriented to person, place, and time.  Fluent speech  Skin: Skin is warm and dry.  Psychiatric: Judgment normal.  Mildly anxious  Nursing note and vitals reviewed.    ED Treatments / Results  Labs (all labs ordered are listed, but only abnormal results are displayed) Labs Reviewed  BASIC METABOLIC PANEL - Abnormal; Notable for the following:       Result Value   Potassium 3.4 (*)    BUN 30 (*)    Calcium 8.2 (*)    All other components within normal limits  CBC - Abnormal; Notable for the following:    RBC 1.59 (*)    Hemoglobin 4.8 (*)    HCT 14.6 (*)    RDW 15.8 (*)    All other components within normal limits  D-DIMER, QUANTITATIVE (NOT AT [facility]) - Abnormal; Notable for the following:    D-Dimer, Quant 0.62 (*)    All other components within normal limits  POC OCCULT BLOOD, ED - Abnormal; Notable for the following:    Fecal Occult Bld POSITIVE (*)    All other components within normal limits  I-STAT TROPOININ, ED  TYPE AND SCREEN  PREPARE RBC (CROSSMATCH)  ABO/RH    EKG  EKG Interpretation  Date/Time:  Wednesday April 17 2017 07:09:20 EDT Ventricular Rate:  80 PR Interval:    QRS Duration: 112 QT Interval:  368 QTC Calculation: 425 R Axis:   40 Text Interpretation:  Sinus rhythm Borderline intraventricular conduction delay Low voltage, precordial leads Borderline repolarization abnormality Baseline wander in lead(s) V3 No significant change since last tracing Confirmed by Theotis Burrow [phone]) on 04/17/2017 7:32:34 AM Also confirmed by Theotis Burrow [phone]), editor Drema Pry  [phone])  on 04/17/2017 9:55:59 AM       Radiology Dg Chest 2 View  Result Date: 04/17/2017 CLINICAL DATA:  Onset of shortness of breath this morning. History of hypertension, hyperlipidemia, nonsmoker. EXAM: CHEST  2 VIEW COMPARISON:  Chest x-ray of Mar 19, 2011 FINDINGS: The lungs are adequately inflated. There is no focal infiltrate. There is stable calcified nodule in the right upper lobe measuring approximately 8 mm in diameter. There is no pleural effusion. The heart and pulmonary vascularity are normal. The mediastinum is normal in width. The bony thorax exhibits no acute abnormality. The gas pattern in the upper abdomen is normal. IMPRESSION: No acute cardiopulmonary abnormality is observed. Evidence of previous granulomatous infection. Electronically Signed   By: David  Martinique M.D.   On: 04/17/2017  08:33    Procedures .Critical Care Performed by: Sharlett Iles Authorized by: Sharlett Iles   Critical care provider statement:    Critical care time (minutes):  60   Critical care time was exclusive of:  Separately billable procedures and treating other patients   Critical care was necessary to treat or prevent imminent or life-threatening deterioration of the following conditions: GI bleed.   Critical care was time spent personally by me on the following activities:  Development of treatment plan with patient or surrogate, discussions with consultants, examination of patient, obtaining history from patient or surrogate, ordering and performing treatments and interventions, ordering and review of laboratory studies, ordering and review of radiographic studies, re-evaluation of patient's condition and review of old charts    (including critical care time)  Medications Ordered in ED Medications  0.9 %  sodium chloride infusion (10 mL/hr Intravenous New Bag/Given 04/17/17 1008)     Initial Impression / Assessment and Plan / ED Course  I have reviewed the triage vital signs  and the nursing notes.  Pertinent labs & imaging results that were available during my care of the patient were reviewed by me and considered in my medical decision making (see chart for details).     PT p/w SOB that began this morning, associated with some chest heaviness. She reports difficulty sleeping the past several nights. On exam,she was Only anxious but in no acute distress with reassuring vital signs, O2 saturation 100% on room air. Clear breath sounds. EKG reassuring with no change from previous. Obtained above lab work. Chest x-ray negative for acute process.  Labs notable for BUN 30, creatinine 0.68, hemoglobin 4.8 which is dramatically lower than her previous. On further questioning, the patient does note black stools since yesterday and has melena on exam. Through interpreter obtained consent for transfusion of 2 units RBC. I had originally sent a d-dimer which was mildly elevated at 0.6, however with this new information I suspect that her shortness of breath is related to her severe anemia and PE is much less likely explanation especially given she is 100% on room air with no sharp pain. Discussed with GI, Azucena Freed, and they will see patient in consultation. Discussed admission with internal medicine teaching service and pt admitted or further care.  Final Clinical Impressions(s) / ED Diagnoses   Final diagnoses:  None    New Prescriptions New Prescriptions   No medications on file     Elige Shouse, Wenda Overland, MD 04/17/17 1257

## 2017-04-17 NOTE — ED Notes (Signed)
Admitting PA at bedside.

## 2017-04-17 NOTE — Consult Note (Signed)
[facility] Consult: 11:45 AM 04/17/2017  LOS: 0 days    Referring Provider: Dr Rex Kras in ED  Primary Care Physician:  Arnoldo Morale, MD Primary Gastroenterologist:  unassigned     Reason for Consultation:  FOBT + anemia.     Spoke with pt and her dtr via Electronics engineer.  Dtr speaks fair English  HPI: Alice Woodard is a 46 y.o. female.  Came from [place] to [place] 8 years ago.  PMH Htn.  HLD.  Given label of gastritis and duodenitis based on c/o abd bloating, increased flatus at PMD OV 04/2016, she takes Protonix now.  Also take NSAIDs: 500mg  Naproxen daily for heal pain.  .   Presented to ED this AM c/o SOB, chest pain which is dull, heavy, mild intensity.  Tingling, numbness in feet and hands.  No nause, vomiting, fever, abdominal pain.   sxs of chest pain, extremity tingling and insomnia started 2 to 3 weeks ago, the weakness, DOE/SOB started in last few days.  Black stools for several days.  No anorexia or n/v.  Intermittent dysphagia.  No constipation no BPR.  No prior EGD/colonoscopy.  She is post menopausal.  Has many years of occasional, non-severe RUQ discomfort, dating back to her open cholecystectomy in [place].  No hx of anemia.  No hx liver disease or jaundice.  No excessive bleeding or bruising.  Some weight loss from following low fat diet.   Hgb measures 4.8 (12.8 in 12/2015) with MCV 91.  BUN elevated at 30.  Blood transfusions initiated. .   Troponin I is 0.0.       Past Medical History:  Diagnosis Date  . Hyperlipidemia   . Hypertension     Past Surgical History:  Procedure Laterality Date  . ABDOMINAL SURGERY    . APPENDECTOMY    . CHOLECYSTECTOMY      Prior to Admission medications   Medication Sig Start Date End Date Taking? Authorizing Provider  amLODipine (NORVASC) 10 MG tablet  Take 1 tablet (10 mg total) by mouth daily.  Not taking due to rash from this or from lipitor. 01/25/17  Yes Arnoldo Morale, MD  atorvastatin (LIPITOR) 20 MG tablet Take 1 tablet (20 mg total) by mouth daily. Not taking as it or the amlodipine gave her a rash 01/25/17  Yes Arnoldo Morale, MD         hydrochlorothiazide (HYDRODIURIL) 25 MG tablet Take 1 tablet (25 mg total) by mouth daily. 01/25/17  Yes Arnoldo Morale, MD  naproxen (NAPROSYN) 500 MG tablet Take 1 tablet (500 mg total) by mouth once daily 01/25/17  Yes Amao, Enobong, MD  losartan (COZAAR) 100 MG tablet Take 1 tablet (100 mg total) by mouth daily. Patient not taking: Reported on 04/17/2017 01/25/17   Arnoldo Morale, MD  pantoprazole (PROTONIX) 40 MG tablet Take 1 tablet (40 mg total) by mouth daily. Patient not taking: Reported on 04/17/2017 01/25/17   Arnoldo Morale, MD  potassium chloride (K-DUR) 10 MEQ tablet Take 1 tablet (10 mEq total) by mouth daily. Patient not taking: Reported on  01/25/2017 10/26/16   Arnoldo Morale, MD    Scheduled Meds:  Infusions:  PRN Meds:    Allergies as of 04/17/2017 - Review Complete 04/17/2017  Allergen Reaction Noted  . Amlodipine Rash 04/17/2017  . Atorvastatin Rash 04/17/2017    No family history on file.  Social History   Social History  . Marital status: Married    Spouse name: N/A  . Number of children: N/A  . Years of education: N/A   Occupational History  . Not on file.   Social History Main Topics  . Smoking status: Never Smoker  . Smokeless tobacco: Never Used  . Alcohol use No  . Drug use: No  . Sexual activity: Not on file   Other Topics Concern  . Not on file   Social History Narrative  . No narrative on file    REVIEW OF SYSTEMS: Constitutional:  Per HPI ENT:  No nose bleeds Pulm:  Per HPI.  No cough.   CV:  No palpitations, no LE edema.  GU:  No hematuria, no frequency. GI:  Per HPI Heme:  No bleeding or bruising.     Transfusions:  None ever before  today Neuro:  No headaches, no peripheral tingling or numbness Derm:  No itching, no rash or sores.  Endocrine:  No sweats or chills.  No polyuria or dysuria Immunization:  Flu shot in 12/2015 Travel:  None beyond local counties in last few months.    PHYSICAL EXAM: Vital signs in last 24 hours: Vitals:   04/17/17 1107 04/17/17 1141  BP: (!) 149/86 (!) 152/79  Pulse: 79 83  Resp: 14 (!) 22  Temp: 98.8 F (37.1 C)    Wt Readings from Last 3 Encounters:  04/17/17 59 kg (130 lb)  01/25/17 55.2 kg (121 lb 9.6 oz)  10/26/16 56.1 kg (123 lb 9.6 oz)    General: pleasant, non-ill looking woman Head:  No asymmetry or swelling  Eyes:  No icterus or pallor Ears:  Not HOH  Nose:  No congestion or discharge Mouth:  clear Neck:  No mass, no JVD.   Lungs:  Clear bil.  No SOB or cough Heart: RRR.  No MRG.  S1, S2 present.   Abdomen:  Soft, NT, ND.  Active BS.  No HSM.  Chole scar present.   Rectal: deferred.  Per ED the stool is melenic/black and FOBT +   Musc/Skeltl: no joint swelling, redness or deformity Extremities:  No CCE  Neurologic:  Oriented x 3.  No limb weakness, no tremor.   Skin:  No rash, no sores Tattoos:  none Nodes:  No cervical adenopathy   Psych:  Pleasant, calm, cooperative  Intake/Output from previous day: No intake/output data recorded. Intake/Output this shift: No intake/output data recorded.  LAB RESULTS:  Recent Labs  04/17/17 0740  WBC 8.1  HGB 4.8*  HCT 14.6*  PLT 238   BMET Lab Results  Component Value Date   NA 140 04/17/2017   NA 142 01/25/2017   NA 140 10/26/2016   K 3.4 (L) 04/17/2017   K 3.7 01/25/2017   K 3.7 10/26/2016   CL 109 04/17/2017   CL 107 01/25/2017   CL 104 10/26/2016   CO2 24 04/17/2017   CO2 28 01/25/2017   CO2 28 10/26/2016   GLUCOSE 99 04/17/2017   GLUCOSE 79 01/25/2017   GLUCOSE 72 10/26/2016   BUN 30 (H) 04/17/2017   BUN 12 01/25/2017   BUN 11 10/26/2016   CREATININE  0.68 04/17/2017   CREATININE 0.79  01/25/2017   CREATININE 0.99 10/26/2016   CALCIUM 8.2 (L) 04/17/2017   CALCIUM 9.3 01/25/2017   CALCIUM 9.7 10/26/2016   LFT No results for input(s): PROT, ALBUMIN, AST, ALT, ALKPHOS, BILITOT, BILIDIR, IBILI in the last 72 hours. PT/INR No results found for: INR, PROTIME Hepatitis Panel No results for input(s): HEPBSAG, HCVAB, HEPAIGM, HEPBIGM in the last 72 hours. C-Diff No components found for: CDIFF Lipase  No results found for: LIPASE  Drugs of Abuse  No results found for: LABOPIA, COCAINSCRNUR, LABBENZ, AMPHETMU, THCU, LABBARB   RADIOLOGY STUDIES: Dg Chest 2 View  Result Date: 04/17/2017 CLINICAL DATA:  Onset of shortness of breath this morning. History of hypertension, hyperlipidemia, nonsmoker. EXAM: CHEST  2 VIEW COMPARISON:  Chest x-ray of Mar 19, 2011 FINDINGS: The lungs are adequately inflated. There is no focal infiltrate. There is stable calcified nodule in the right upper lobe measuring approximately 8 mm in diameter. There is no pleural effusion. The heart and pulmonary vascularity are normal. The mediastinum is normal in width. The bony thorax exhibits no acute abnormality. The gas pattern in the upper abdomen is normal. IMPRESSION: No acute cardiopulmonary abnormality is observed. Evidence of previous granulomatous infection. Electronically Signed   By: David  Martinique M.D.   On: 04/17/2017 08:33     IMPRESSION:   *  GIB.  Suspect blood loss from NSAID induced ulcer.    *   Normocytic anemia, due to blood loss.  Symptomatic with DOE, chest pain, peripheral tingling.  1 of 2 units transfused thus far  *  Azotemia due to GIB    PLAN:     *  EGD tomorrow.  Discussed details, risks, benefits with pt who agrees to proceed.   *  Start BID IV Protonix.  Ok to have clears tonite, NPO after midnight.   *  H/H tonite 7 PM.  CBC in AM.     Azucena Freed  04/17/2017, 11:45 AM Pager: [phone]   Attending physician's note   I have taken a history, examined the patient  and reviewed the chart. I agree with the Advanced Practitioner's note, impression and recommendations. Nepali speaking. Used Marketing executive Patient complaints of left upper quadrant abdominal pain and intermittent nausea and also has history of melena for past few days Presented with severe anemia, hemoglobin 4.8 in the setting of NSAID use We'll plan for EGD tomorrow to evaluate and intervene if needed The risks and benefits as well as alternatives of endoscopic procedure(s) have been discussed and reviewed. All questions answered. The patient agrees to proceed. Recheck hemoglobin tonight and transfuse to hemoglobin greater than 7 Continue PPI Nothing by mouth after midnight  K Denzil Magnuson, MD [phone] Mon-Fri 8a-5p [phone] after 5p, weekends, holidays

## 2017-04-17 NOTE — ED Notes (Addendum)
  This nurse remains at bedside

## 2017-04-17 NOTE — ED Notes (Signed)
Attempted to call report

## 2017-04-17 NOTE — ED Triage Notes (Signed)
Pt arrives with family and through [place] interpretor c/o shortness of breath and chest pain this am. C/o "being ticklish at hands and feet. Pt states she has episodes of SHOB.

## 2017-04-17 NOTE — ED Notes (Signed)
Pt somewhat tearful as she talks with family in [employer]. Family remains at bedside.

## 2017-04-17 NOTE — ED Notes (Addendum)
Pt ambulated to restroom, no issues. Pt placed back on monitor, SpO2 was at 100.

## 2017-04-17 NOTE — ED Notes (Signed)
Dr. Rex Kras aware of temp prior to blood.

## 2017-04-17 NOTE — H&P (Signed)
Date: 04/17/2017               Patient Name:  Alice Woodard MRN: [id number]  DOB: 03/09/1971 Age / Sex: 46 y.o., female   PCP: Arnoldo Morale, MD         Medical Service: Internal Medicine Teaching Service         Attending Physician: Dr. Lucious Groves, DO    First Contact: Loleta Books, MS4 Pager: [phone]  Second Contact: Dr. Jule Ser Pager: [phone]       After Hours (After 5p/  First Contact Pager: [phone]  weekends / holidays): Second Contact Pager: [phone]   Chief Complaint: dyspnea and fatigue  History of Present Illness: Alice Woodard is a 46 year old woman with hypertension, hyperlipidemia who presented to the ED with difficulty breathing. Her daughter and daughter-in-law were at bedside and contributed to the interview with the aid of an interpreter.  This morning, she awoke with difficulty breathing and poor energy that limited her ability to walk or even to the bathroom which prompted her presentation where she was found to have H&H of 4.8/14.6 with an MCV of 91.8 (last H&H in Feb 2017 was 12.8/37.4). She takes naproxen daily for the last 4-5 years and noticed melena with some nausea and palpitations though denies any unintentional weight loss, changes in appetite, vomiting, hematochezia, abnormal menstrual bleeding, prior history of GI bleeding, anemia, colonoscopy, or endoscopy. Per chart review, she has a prior history of GERD and was prescribed PPI therapy so is not with her medications today. Her brother requires frequent transfusions for unknown reasons, and her mother passed away from blood cancer. She moved to the [place] from [place] in 2010 and lives at home with family. She denies any tobacco, alcohol, or illicit drug use.  Other labwork on admission was notable for elevated BUN 30, Crt 0.68 (baseline 0.7-0.8), elevated D-dimer 0.62, FOBT+. She was transfused pRBC x 2, and GI was consulted for admission.  Meds:  Current Meds  Medication Sig  . amLODipine  (NORVASC) 10 MG tablet Take 1 tablet (10 mg total) by mouth daily.  Marland Kitchen atorvastatin (LIPITOR) 20 MG tablet Take 1 tablet (20 mg total) by mouth daily.  Marland Kitchen gabapentin (NEURONTIN) 300 MG capsule Take 1 capsule (300 mg total) by mouth 3 (three) times daily.  . hydrochlorothiazide (HYDRODIURIL) 25 MG tablet Take 1 tablet (25 mg total) by mouth daily.  . naproxen (NAPROSYN) 500 MG tablet Take 1 tablet (500 mg total) by mouth 2 (two) times daily with a meal.     Allergies: Allergies as of 04/17/2017 - Review Complete 04/17/2017  Allergen Reaction Noted  . Amlodipine Rash 04/17/2017  . Atorvastatin Rash 04/17/2017   Past Medical History:  Diagnosis Date  . Hyperlipidemia   . Hypertension    Past Surgical History: - Appendectomy - Cholecystectomy  Family History:  - Mother passed from blood cancer (specifc type unknown) - Brother has anemia (specific type unknown but requires frequent transfusions)  Social History: - Patient is non-English speaking. She no longer works since having multiple health issues. She moved to the [place] from [place] in 2010. She lives with her daughter, son, daughter-in-law and grandson. She eats a healthy diet because of hyperlipidemia roti ([place] bread), fruits, vegetables, low-fat carbs.   Review of Systems: Constitutional: positive for fatigue Eyes: decreased visual acuity Ears, nose, mouth, throat, and face: negative Respiratory: positive for dyspnea with activity Cardiovascular: palpitations Gastrointestinal: positive constipation with dark stools Genitourinary:negative Musculoskeletal:  general weakness Neurological: bilateral paresthesias in hands and feet Behavioral/Psych: negative  Physical Exam: Blood pressure 130/73, pulse 85, temperature 98 F (36.7 C), temperature source Oral, resp. rate 16, height 5\' 1"  (1.549 m), weight 59 kg (130 lb), last menstrual period 11/15/2014, SpO2 100 %.  General appearance: alert, cooperative in NAD Head:  Normocephalic, without obvious abnormality, atraumatic Eyes: conjunctival pallor, corneas clear. PERRL, EOM's intact. Fundi benign.  Ears: normal TM's and external ear canals both ears Nose: Nares normal. Septum midline. Mucosa normal. No drainage or sinus tenderness. Throat: lips, mucosa pale, tongue normal; poor dentition Lungs: nl WOB, no respiratory distress, CTAB Heart: regular rate and rhythm, S1, S2 normal, no murmur, click, rub or gallop Abdomen: soft, nl bowel sounds, no palpable masses. Epigastric tenderness to palpation, no guarding or rebound Extremities: extremities, atraumatic, no cyanosis or edema. Pale fingernail beds Pulses: 2+ and symmetric Skin: Pale skin with nl tugor. No rashes or lesions Neuro: CII-XII intact. A&O, behavior appropriate  Lab Results: POC occult blood, (Abnormal) Collected: 04/17/17 0933  Specimen: Stool Updated: 04/17/17 0934   Fecal Occult Bld POSITIVE (A)  CBC (Abnormal) Collected: 04/17/17 0740  Specimen: Blood Updated: 04/17/17 0828   WBC 8.1 K/uL    RBC 1.59 (L) MIL/uL    Hemoglobin 4.8 (LL) g/dL    HCT 14.6 (L) %    MCV 91.8 fL    MCH 30.2 pg    MCHC 32.9 g/dL    RDW 15.8 (H) %    Platelets 238 K/uL   Basic metabolic panel (Abnormal) Collected: 04/17/17 0740  Specimen: Blood Updated: 04/17/17 0826   Sodium 140 mmol/L    Potassium 3.4 (L) mmol/L    Chloride 109 mmol/L    CO2 24 mmol/L    Glucose, Bld 99 mg/dL    BUN 30 (H) mg/dL    Creatinine, Ser 0.68 mg/dL    Calcium 8.2 (L) mg/dL    GFR calc non Af Amer >60 mL/min    GFR calc Af Amer >60 mL/min    Anion gap 7  D-dimer, quantitative (Abnormal) Collected: 04/17/17 0740  Specimen: Blood Updated: 04/17/17 0811   D-Dimer, Quant 0.62 (H) ug/mL-FEU   I-stat troponin, ED [[id number]] Collected: 04/17/17 0758  Specimen: Blood Updated: 04/17/17 0809   Troponin i, poc 0.00 ng/mL    Comment 3      EKG: I reviewed and compared with 12/16/12. -Normal rhythm, axis -Intraventricular  conduction delay?  CXR: I reviewed and compared with 03/19/11. No consolidation, cardiomegaly, or edema noted.   Assessment & Plan by Problem: Principal Problem:   Symptomatic anemia Active Problems:   Dyslipidemia  Ms. Boston is a 46 year old woman with hypertension, hyperlipidemia hospitalized for symptomatic anemia from GI bleeding.  GI bleeding: Consistent with melena, elevated BUN, FOBT+. Normocytic anemia with 8 point drop in Hb suggests acute blood loss. Peptic ulcer disease is likely given chronic NSAID use though [place] has 40-90% prevalence of H. Pylori. She does have low albumin but no findings of liver disease on exam or abnormal LFTs to suggest cirrhosis. No recent constitutional symptoms to suggest occult malignancy. Lower sources seem less likely in the absence of hematochezia.  -Follow-up retic count to confirm appropriate marrow response -Check smear -Follow-up EGD results -Repeat CBC to assess for appropriate Hb increase -Continue pantoprazole 40 mg twice daily  -GI following, appreciate recommendations  GERD:  Prior history per chart review. PPI as noted above.  Hyperlipidemia: Hold home Atovastatin 20 mg daily  Hypertension: Hold  home Amlodipine 10 mg daily and HCTZ 25 mg daily  Achilles tendonitis: Hold home Naproxen 500 mg BID  FEN: Clear liquid diet, NPO at midnight for EGD  VTE prophylaxis: SCDs  Code: FULL CODE -Defer to daughter Hema [[phone]] if patients lacks decision-making capacity -Confirmed with patient on admission  Dispo: Admit patient to Observation with expected length of stay less than 2 midnights.  Signed: Loleta Books, Medical Student 04/17/2017, 2:50 PM  Pager: [phone]  Attestation for Student Documentation:  I personally was present and performed or re-performed the history, physical exam and medical decision-making activities of this service and have verified that the service and findings are accurately documented in the  student's note.  Riccardo Dubin, MD 04/17/2017, 5:08 PM

## 2017-04-18 ENCOUNTER — Inpatient Hospital Stay (HOSPITAL_COMMUNITY): Payer: Self-pay | Admitting: Certified Registered"

## 2017-04-18 ENCOUNTER — Encounter (HOSPITAL_COMMUNITY)
Admission: EM | Disposition: A | Discharge status: Home / Self Care | Payer: Self-pay | Source: Home / Self Care | Attending: Internal Medicine

## 2017-04-18 ENCOUNTER — Encounter (HOSPITAL_COMMUNITY): Payer: Self-pay | Admitting: *Deleted

## 2017-04-18 DIAGNOSIS — K264 Chronic or unspecified duodenal ulcer with hemorrhage: Principal | ICD-10-CM

## 2017-04-18 DIAGNOSIS — K219 Gastro-esophageal reflux disease without esophagitis: Secondary | ICD-10-CM

## 2017-04-18 DIAGNOSIS — Z79899 Other long term (current) drug therapy: Secondary | ICD-10-CM

## 2017-04-18 DIAGNOSIS — E785 Hyperlipidemia, unspecified: Secondary | ICD-10-CM

## 2017-04-18 DIAGNOSIS — Z888 Allergy status to other drugs, medicaments and biological substances status: Secondary | ICD-10-CM

## 2017-04-18 DIAGNOSIS — K922 Gastrointestinal hemorrhage, unspecified: Secondary | ICD-10-CM

## 2017-04-18 DIAGNOSIS — D500 Iron deficiency anemia secondary to blood loss (chronic): Secondary | ICD-10-CM

## 2017-04-18 DIAGNOSIS — E876 Hypokalemia: Secondary | ICD-10-CM

## 2017-04-18 DIAGNOSIS — M7660 Achilles tendinitis, unspecified leg: Secondary | ICD-10-CM

## 2017-04-18 DIAGNOSIS — I10 Essential (primary) hypertension: Secondary | ICD-10-CM

## 2017-04-18 DIAGNOSIS — K624 Stenosis of anus and rectum: Secondary | ICD-10-CM

## 2017-04-18 HISTORY — PX: ESOPHAGOGASTRODUODENOSCOPY: SHX5428

## 2017-04-18 LAB — CBC
HCT: 22.6 % — ABNORMAL LOW (ref 36.0–46.0)
Hemoglobin: 7.6 g/dL — ABNORMAL LOW (ref 12.0–15.0)
MCH: 29.8 pg (ref 26.0–34.0)
MCHC: 33.6 g/dL (ref 30.0–36.0)
MCV: 88.6 fL (ref 78.0–100.0)
PLATELETS: 185 10*3/uL (ref 150–400)
RBC: 2.55 MIL/uL — AB (ref 3.87–5.11)
RDW: 15.2 % (ref 11.5–15.5)
WBC: 10.7 10*3/uL — ABNORMAL HIGH (ref 4.0–10.5)

## 2017-04-18 LAB — COMPREHENSIVE METABOLIC PANEL
ALBUMIN: 2.5 g/dL — AB (ref 3.5–5.0)
ALK PHOS: 58 U/L (ref 38–126)
ALT: 9 U/L — ABNORMAL LOW (ref 14–54)
ANION GAP: 7 (ref 5–15)
AST: 14 U/L — ABNORMAL LOW (ref 15–41)
BILIRUBIN TOTAL: 1.2 mg/dL (ref 0.3–1.2)
BUN: 26 mg/dL — AB (ref 6–20)
CALCIUM: 8.1 mg/dL — AB (ref 8.9–10.3)
CO2: 22 mmol/L (ref 22–32)
CREATININE: 0.71 mg/dL (ref 0.44–1.00)
Chloride: 111 mmol/L (ref 101–111)
GFR calc Af Amer: >60 mL/min (ref >60)
GFR calc non Af Amer: >60 mL/min (ref >60)
GLUCOSE: 91 mg/dL (ref 65–99)
Potassium: 3.2 mmol/L — ABNORMAL LOW (ref 3.5–5.1)
Sodium: 140 mmol/L (ref 135–145)
TOTAL PROTEIN: 4.3 g/dL — AB (ref 6.5–8.1)

## 2017-04-18 LAB — MAGNESIUM: Magnesium: 2 mg/dL (ref 1.7–2.4)

## 2017-04-18 LAB — HIV ANTIBODY (ROUTINE TESTING W REFLEX): HIV Screen 4th Generation wRfx: NONREACTIVE

## 2017-04-18 LAB — SAVE SMEAR

## 2017-04-18 SURGERY — EGD (ESOPHAGOGASTRODUODENOSCOPY)
Anesthesia: Monitor Anesthesia Care

## 2017-04-18 MED ORDER — ACETAMINOPHEN 325 MG PO TABS: 650.0000 mg | ORAL_TABLET | Freq: Four times a day (QID) | ORAL | 0 refills | Status: DC | PRN

## 2017-04-18 MED ORDER — POTASSIUM CHLORIDE CRYS ER 20 MEQ PO TBCR
30.0000 meq | EXTENDED_RELEASE_TABLET | Freq: Once | ORAL | Status: AC
  Administered 2017-04-18: 11:00:00 30 meq via ORAL
  Filled 2017-04-18: qty 1

## 2017-04-18 MED ORDER — GLYCOPYRROLATE 0.2 MG/ML IJ SOLN
INTRAMUSCULAR | Status: DC | PRN
  Administered 2017-04-18: 0.1 mg via INTRAVENOUS

## 2017-04-18 MED ORDER — PHENYLEPHRINE 40 MCG/ML (10ML) SYRINGE FOR IV PUSH (FOR BLOOD PRESSURE SUPPORT)
PREFILLED_SYRINGE | INTRAVENOUS | Status: DC | PRN
  Administered 2017-04-18 (×2): 40 ug via INTRAVENOUS

## 2017-04-18 MED ORDER — LACTATED RINGERS IV SOLN
INTRAVENOUS | Status: DC | PRN
  Administered 2017-04-18: 08:00:00 via INTRAVENOUS

## 2017-04-18 MED ORDER — PANTOPRAZOLE SODIUM 40 MG PO TBEC: 40.0000 mg | DELAYED_RELEASE_TABLET | Freq: Every day | ORAL | 0 refills | Status: DC

## 2017-04-18 MED ORDER — PANTOPRAZOLE SODIUM 40 MG PO TBEC: 40.0000 mg | DELAYED_RELEASE_TABLET | Freq: Two times a day (BID) | ORAL | 0 refills | Status: DC

## 2017-04-18 MED ORDER — LIDOCAINE HCL (CARDIAC) 20 MG/ML IV SOLN: INTRAVENOUS | Status: DC | PRN

## 2017-04-18 MED ORDER — PROPOFOL 10 MG/ML IV BOLUS
INTRAVENOUS | Status: DC | PRN
Start: 2017-04-18 — End: 2017-04-18
  Administered 2017-04-18: 80 mg via INTRAVENOUS
  Administered 2017-04-18 (×3): 30 mg via INTRAVENOUS

## 2017-04-18 MED ORDER — FERROUS SULFATE 325 (65 FE) MG PO TABS: 325.0000 mg | ORAL_TABLET | Freq: Every day | ORAL | 0 refills | Status: DC

## 2017-04-18 MED ORDER — BUTAMBEN-TETRACAINE-BENZOCAINE 2-2-14 % EX AERO
INHALATION_SPRAY | CUTANEOUS | Status: DC | PRN
  Administered 2017-04-18: 1 via TOPICAL

## 2017-04-18 MED ORDER — POTASSIUM CHLORIDE 10 MEQ/100ML IV SOLN
10.0000 meq | INTRAVENOUS | Status: DC
  Administered 2017-04-18: 10 meq via INTRAVENOUS
  Filled 2017-04-18: qty 100

## 2017-04-18 MED ORDER — LIDOCAINE 2% (20 MG/ML) 5 ML SYRINGE
INTRAMUSCULAR | Status: DC | PRN
  Administered 2017-04-18: 40 mg via INTRAVENOUS

## 2017-04-18 MED FILL — ?PANTOPRAZOLE SOD DR 40MG: 40 MG | 30 days supply | Qty: 30 | Fill #0

## 2017-04-18 MED FILL — POTASSIUM CL 10 MEQ TAB SA: 10 | 30 days supply | Qty: 30 | Fill #2

## 2017-04-18 MED FILL — FERROUS SULFATE 325 MG TAB: 325 (65 FE) | 30 days supply | Qty: 30 | Fill #0

## 2017-04-18 NOTE — Anesthesia Postprocedure Evaluation (Signed)
Anesthesia Post Note  Patient: Alice Woodard  Procedure(s) Performed: Procedure(s) (LRB): ESOPHAGOGASTRODUODENOSCOPY (EGD) (N/A)     Patient location during evaluation: PACU Anesthesia Type: MAC Level of consciousness: awake Pain management: pain level controlled Vital Signs Assessment: post-procedure vital signs reviewed and stable Respiratory status: spontaneous breathing Cardiovascular status: stable Anesthetic complications: no    Last Vitals:  Vitals:   04/18/17 0920 04/18/17 0929  BP: 123/82   Pulse: 98 80  Resp: 18 18  Temp:      Last Pain:  Vitals:   04/18/17 1000  TempSrc:   PainSc: 4                  Indy Prestwood

## 2017-04-18 NOTE — Anesthesia Preprocedure Evaluation (Addendum)
Anesthesia Evaluation  Patient identified by MRN, date of birth, ID band Patient awake    Reviewed: Allergy & Precautions, NPO status , Patient's Chart, lab work & pertinent test results  Airway Mallampati: II  TM Distance: >3 FB     Dental   Pulmonary    breath sounds clear to auscultation       Cardiovascular hypertension,  Rhythm:Regular Rate:Normal     Neuro/Psych  Neuromuscular disease    GI/Hepatic negative GI ROS, Neg liver ROS,   Endo/Other  negative endocrine ROS  Renal/GU negative Renal ROS     Musculoskeletal   Abdominal   Peds  Hematology  (+) anemia ,   Anesthesia Other Findings   Reproductive/Obstetrics                            Anesthesia Physical Anesthesia Plan  ASA: III  Anesthesia Plan: MAC   Post-op Pain Management:    Induction: Intravenous  PONV Risk Score and Plan: Ondansetron and Propofol  Airway Management Planned: Simple Face Mask  Additional Equipment:   Intra-op Plan:   Post-operative Plan:   Informed Consent: I have reviewed the patients History and Physical, chart, labs and discussed the procedure including the risks, benefits and alternatives for the proposed anesthesia with the patient or authorized representative who has indicated his/her understanding and acceptance.   Dental advisory given  Plan Discussed with: CRNA and Anesthesiologist  Anesthesia Plan Comments:        Anesthesia Quick Evaluation

## 2017-04-18 NOTE — Progress Notes (Signed)
[employer] video interpreting used to translate while giving morning medication and assessing pt.

## 2017-04-18 NOTE — Anesthesia Procedure Notes (Signed)
Procedure Name: MAC Date/Time: 04/18/2017 8:46 AM Performed by: Orlie Dakin Pre-anesthesia Checklist: Patient identified, Emergency Drugs available, Suction available, Patient being monitored and Timeout performed Patient Re-evaluated:Patient Re-evaluated prior to inductionOxygen Delivery Method: Nasal cannula

## 2017-04-18 NOTE — Op Note (Signed)
[facility] Patient Name: Alice Woodard Procedure Date : 04/18/2017 MRN: [id number] Attending MD: Mauri Pole , MD Date of Birth: 1971-10-25 CSN: [id number] Age: 46 Admit Type: Inpatient Procedure:                Upper GI endoscopy Indications:              Active gastrointestinal bleeding, Suspected upper                            gastrointestinal bleeding, Iron deficiency anemia                            due to suspected upper gastrointestinal bleeding Providers:                Mauri Pole, MD, Zenon Mayo, RN, Alan Mulder, Technician, Cletis Athens, Technician Referring MD:              Medicines:                Monitored Anesthesia Care Complications:            No immediate complications. Estimated Blood Loss:     Estimated blood loss was minimal. Procedure:                Pre-Anesthesia Assessment:                           - Prior to the procedure, a History and Physical                            was performed, and patient medications and                            allergies were reviewed. The patient's tolerance of                            previous anesthesia was also reviewed. The risks                            and benefits of the procedure and the sedation                            options and risks were discussed with the patient.                            All questions were answered, and informed consent                            was obtained. Prior Anticoagulants: The patient has                            taken no previous anticoagulant or antiplatelet  agents. ASA Grade Assessment: II - A patient with                            mild systemic disease. After reviewing the risks                            and benefits, the patient was deemed in                            satisfactory condition to undergo the procedure.                           After obtaining informed consent, the  endoscope was                            passed under direct vision. Throughout the                            procedure, the patient's blood pressure, pulse, and                            oxygen saturations were monitored continuously. The                            EG-[id number] ([id number]) scope was introduced through the                            mouth, and advanced to the second part of duodenum.                            The upper GI endoscopy was accomplished without                            difficulty. The patient tolerated the procedure                            well. Scope In: Scope Out: Findings:      The esophagus was normal.      The entire examined stomach was normal. Biopsies were taken with a cold       forceps for histology. Biopsies were taken with a cold forceps for       Helicobacter pylori testing.      Two non-bleeding cratered duodenal ulcers with a visible vessel were       found in the duodenal bulb. The largest lesion was 10 mm in largest       dimension. Coagulation for bleeding prevention using bipolar probe was       successful. For hemostasis, one hemostatic clip was successfully placed       (MR conditional). There was no bleeding at the end of the procedure. Impression:               - Normal esophagus.                           - Normal stomach. Biopsied.                           -  Multiple non-bleeding duodenal ulcers with a                            visible vessel. Treated with bipolar cautery. Clip                            (MR conditional) was placed. Moderate Sedation:      N/A Recommendation:           - Patient has a contact number available for                            emergencies. The signs and symptoms of potential                            delayed complications were discussed with the                            patient. Return to normal activities tomorrow.                            Written discharge instructions were provided to the                             patient.                           - Slowly advance diet as tolerated                           - Full liquid today                           - Continue present medications.                           - PPI twice daily X2 months                           - No ibuprofen, naproxen, or other non-steroidal                            anti-inflammatory drugs.                           - Await pathology results and H.pylori, if positive                            will need to be treated. Procedure Code(s):        --- Professional ---                           54098, 46, Esophagogastroduodenoscopy, flexible,                            transoral; with control of bleeding, any method  38882, Esophagogastroduodenoscopy, flexible,                            transoral; with biopsy, single or multiple Diagnosis Code(s):        --- Professional ---                           K26.4, Chronic or unspecified duodenal ulcer with                            hemorrhage                           K92.2, Gastrointestinal hemorrhage, unspecified                           D50.9, Iron deficiency anemia, unspecified CPT copyright 2016 American Medical Association. All rights reserved. The codes documented in this report are preliminary and upon coder review may  be revised to meet current compliance requirements. Mauri Pole, MD 04/18/2017 9:28:52 AM This report has been signed electronically. Number of Addenda: 0

## 2017-04-18 NOTE — Progress Notes (Addendum)
Alice Woodard to be D/C'd Home per MD order.  Discussed with the patient and all questions fully answered.  VSS, Skin clean, dry and intact without evidence of skin break down, no evidence of skin tears noted. IV catheter discontinued intact. Site without signs and symptoms of complications. Dressing and pressure applied.  An After Visit Summary was printed and given to the patient. Patient received prescription.  D/c education completed with patient/family including follow up instructions, medication list, d/c activities limitations if indicated, with other d/c instructions as indicated by MD - patient able to verbalize understanding, all questions fully answered. [employer] video interpreting was used.   Patient instructed to return to ED, call 911, or call MD for any changes in condition.   Patient escorted via [name], and D/C home via private auto.  Alice Woodard 04/18/2017 1:41 PM

## 2017-04-18 NOTE — Discharge Summary (Signed)
Name: Alice Woodard MRN: [id number] DOB: Aug 01, 1971 46 y.o. PCP: Alice Morale, MD  Date of Admission: 04/17/2017  7:03 AM Date of Discharge: 04/18/2017 Attending Physician: Alice Groves, DO  Discharge Diagnosis:  Principal Problem:   Symptomatic anemia Active Problems:   Dyslipidemia   Gastrointestinal hemorrhage associated with duodenal ulcer   Upper GI bleed   Discharge Medications: Allergies as of 04/18/2017      Reactions   Amlodipine Rash   Atorvastatin Rash      Medication List    STOP taking these medications   losartan 100 MG tablet Commonly known as:  COZAAR   naproxen 500 MG tablet Commonly known as:  NAPROSYN     TAKE these medications   acetaminophen 325 MG tablet Commonly known as:  TYLENOL Take 2 tablets (650 mg total) by mouth every 6 (six) hours as needed for mild pain (or Fever >/= 101).   amLODipine 10 MG tablet Commonly known as:  NORVASC Take 1 tablet (10 mg total) by mouth daily.   atorvastatin 20 MG tablet Commonly known as:  LIPITOR Take 1 tablet (20 mg total) by mouth daily.   ferrous sulfate 325 (65 FE) MG tablet Take 1 tablet (325 mg total) by mouth daily.   gabapentin 300 MG capsule Commonly known as:  NEURONTIN Take 1 capsule (300 mg total) by mouth 3 (three) times daily.   hydrochlorothiazide 25 MG tablet Commonly known as:  HYDRODIURIL Take 1 tablet (25 mg total) by mouth daily.   pantoprazole 40 MG tablet Commonly known as:  PROTONIX Take 1 tablet (40 mg total) by mouth 2 (two) times daily. What changed:  when to take this   potassium chloride 10 MEQ tablet Commonly known as:  K-DUR Take 1 tablet (10 mEq total) by mouth daily.       Disposition and follow-up:   Ms.Alice Woodard was discharged from [facility] in Stable condition.    1.  At the hospital follow up visit please address:  - Whether she is avoiding NSAIDs - Adherence with PPI - Heel pain and consider PT evaluation vs Sports Med  referral - H. Pylori results, may need treatment. - Follow up blood pressures.  She reports not taking Losartan.  She was continued on HCTZ and Amlodipine.  2.  Labs / imaging needed at time of follow-up: CBC, BMET  3.  Pending labs/ test needing follow-up: Surgical path from EGD for H. Pylori  Follow-up Appointments: Follow-up Information    Alice Morale, MD. Schedule an appointment as soon as possible for a visit.   Specialty:  Family Medicine Why:  for hospital follow up. Contact information: [place] [address] [phone]        Alice Pole, MD. Call.   Specialty:  Gastroenterology Why:  for procedure test results and possible follow up appointment Contact information: [place] [phone] [facility] by problem list: Principal Problem:   Symptomatic anemia Active Problems:   Dyslipidemia   Gastrointestinal hemorrhage associated with duodenal ulcer   Upper GI bleed   #Upper GI bleeding: EGD showed 2 non-bleeding duodenal ulcers with a visible vessel that was cauterized and clipped. Likely source of GI bleed and resultant anemia. Biopsied, patient will need to f/u for results H. Pylori. Advised to stop Naproxen and other NSAID use. Hg responded appropriately to pRBCs transfusions, 7.6 this AM. Symptomatically stable. -  Prescribe Protonix 40 mg twice daily   - Prescribe PO Ferrous Sulfate 325mg  daily - Follow up CBC outpatient  #Hypokalemia: K 3.2 today, 3.4 yesterday.  - PO K 49mEq supplementation before discharge. - Resume home Potassium - Follow up BMET outpatient  #GERD: PPI as noted above  #Hyperlipidemia: Continue home Atovastatin 20 mg daily  #Hypertension: Continue home Amlodipine 10 mg daily and HCTZ 25 mg daily  #Achilles tendonitis:Stop Naproxen 500 mg BID. Use Tylenol 650 mg q6h PRN and ice  Discharge Vitals:   BP 123/82   Pulse 80   Temp 98.8 F (37.1 C)  (Oral)   Resp 18   Ht 5' (1.524 m)   Wt 130 lb (59 kg)   LMP 11/15/2014   SpO2 100%   BMI 25.39 kg/m   Pertinent Labs, Studies, and Procedures:  CBC Latest Ref Rng & Units 04/18/2017 04/17/2017 04/17/2017  WBC 4.0 - 10.5 K/uL 10.7(H) - 8.1  Hemoglobin 12.0 - 15.0 g/dL 7.6(L) 7.7(L) 4.8(LL)  Hematocrit 36.0 - 46.0 % 22.6(L) 22.8(L) 14.6(L)  Platelets 150 - 400 K/uL 185 - 238   EGD: Findings: - Normal esophagus. - Normal stomach. Biopsied. - Multiple non-bleeding duodenal ulcers with a visible vessel. Treated with bipolar cautery. Clip (MR conditional) was placed.  Recommendations: - Slowly advance diet as tolerated - Full liquid today - Continue present medications. - PPI twice daily X2 months - No ibuprofen, naproxen, or other non-steroidal anti-inflammatory drugs. - Await pathology results and H.pylori, if positive will need to be treated.  Discharge Instructions: Discharge Instructions    Call MD for:  persistant dizziness or light-headedness    Complete by:  As directed    Diet - low sodium heart healthy    Complete by:  As directed    Discharge instructions    Complete by:  As directed    Dear Ms Alice Woodard, You were admitted with low blood counts from an ulcer in your stomach.  This was taken care of by our stomach doctors when you had the EGD.  The cause was from taking too much Aleve (Naproxen) or Ibuprofen.  You should avoid these medications.  For pain, please take Tylenol AS NEEDED and use ice packs on your area of heel pain.  You will need to take a new medication twice per day called Protonix for at least 2 months.  This new prescription (along with Tylenol) has been sent to the pharmacy at [facility].  There is also a prescription for oral iron pills.  Please go there to pick them up.  Please follow up with your Doctor at [place].  Please call doctor Alice Woodard's office to schedule follow up, too. Take Care.   Increase activity  slowly    Complete by:  As directed       Signed: Jule Ser, DO 04/18/2017, 12:03 PM   Pager: [phone]

## 2017-04-18 NOTE — Interval H&P Note (Signed)
History and Physical Interval Note:  04/18/2017 8:49 AM  Alice Woodard  has presented today for surgery, with the diagnosis of anemia, upper gi bleed.  The various methods of treatment have been discussed with the patient and family. After consideration of risks, benefits and other options for treatment, the patient has consented to  Procedure(s): ESOPHAGOGASTRODUODENOSCOPY (EGD) (N/A) as a surgical intervention .  The patient's history has been reviewed, patient examined, no change in status, stable for surgery.  I have reviewed the patient's chart and labs.  Questions were answered to the patient's satisfaction.     Kavitha Nandigam

## 2017-04-18 NOTE — H&P (View-Only) (Signed)
[facility] Consult: 11:45 AM 04/17/2017  LOS: 0 days    Referring Provider: Dr Rex Kras in ED  Primary Care Physician:  Arnoldo Morale, MD Primary Gastroenterologist:  unassigned     Reason for Consultation:  FOBT + anemia.     Spoke with pt and her dtr via Electronics engineer.  Dtr speaks fair English  HPI: Alice Woodard is a 46 y.o. female.  Came from [place] to [place] 8 years ago.  PMH Htn.  HLD.  Given label of gastritis and duodenitis based on c/o abd bloating, increased flatus at PMD OV 04/2016, she takes Protonix now.  Also take NSAIDs: 500mg  Naproxen daily for heal pain.  .   Presented to ED this AM c/o SOB, chest pain which is dull, heavy, mild intensity.  Tingling, numbness in feet and hands.  No nause, vomiting, fever, abdominal pain.   sxs of chest pain, extremity tingling and insomnia started 2 to 3 weeks ago, the weakness, DOE/SOB started in last few days.  Black stools for several days.  No anorexia or n/v.  Intermittent dysphagia.  No constipation no BPR.  No prior EGD/colonoscopy.  She is post menopausal.  Has many years of occasional, non-severe RUQ discomfort, dating back to her open cholecystectomy in [place].  No hx of anemia.  No hx liver disease or jaundice.  No excessive bleeding or bruising.  Some weight loss from following low fat diet.   Hgb measures 4.8 (12.8 in 12/2015) with MCV 91.  BUN elevated at 30.  Blood transfusions initiated. .   Troponin I is 0.0.       Past Medical History:  Diagnosis Date  . Hyperlipidemia   . Hypertension     Past Surgical History:  Procedure Laterality Date  . ABDOMINAL SURGERY    . APPENDECTOMY    . CHOLECYSTECTOMY      Prior to Admission medications   Medication Sig Start Date End Date Taking? Authorizing Provider  amLODipine (NORVASC) 10 MG tablet  Take 1 tablet (10 mg total) by mouth daily.  Not taking due to rash from this or from lipitor. 01/25/17  Yes Arnoldo Morale, MD  atorvastatin (LIPITOR) 20 MG tablet Take 1 tablet (20 mg total) by mouth daily. Not taking as it or the amlodipine gave her a rash 01/25/17  Yes Arnoldo Morale, MD         hydrochlorothiazide (HYDRODIURIL) 25 MG tablet Take 1 tablet (25 mg total) by mouth daily. 01/25/17  Yes Arnoldo Morale, MD  naproxen (NAPROSYN) 500 MG tablet Take 1 tablet (500 mg total) by mouth once daily 01/25/17  Yes Amao, Enobong, MD  losartan (COZAAR) 100 MG tablet Take 1 tablet (100 mg total) by mouth daily. Patient not taking: Reported on 04/17/2017 01/25/17   Arnoldo Morale, MD  pantoprazole (PROTONIX) 40 MG tablet Take 1 tablet (40 mg total) by mouth daily. Patient not taking: Reported on 04/17/2017 01/25/17   Arnoldo Morale, MD  potassium chloride (K-DUR) 10 MEQ tablet Take 1 tablet (10 mEq total) by mouth daily. Patient not taking: Reported on  01/25/2017 10/26/16   Arnoldo Morale, MD    Scheduled Meds:  Infusions:  PRN Meds:    Allergies as of 04/17/2017 - Review Complete 04/17/2017  Allergen Reaction Noted  . Amlodipine Rash 04/17/2017  . Atorvastatin Rash 04/17/2017    No family history on file.  Social History   Social History  . Marital status: Married    Spouse name: N/A  . Number of children: N/A  . Years of education: N/A   Occupational History  . Not on file.   Social History Main Topics  . Smoking status: Never Smoker  . Smokeless tobacco: Never Used  . Alcohol use No  . Drug use: No  . Sexual activity: Not on file   Other Topics Concern  . Not on file   Social History Narrative  . No narrative on file    REVIEW OF SYSTEMS: Constitutional:  Per HPI ENT:  No nose bleeds Pulm:  Per HPI.  No cough.   CV:  No palpitations, no LE edema.  GU:  No hematuria, no frequency. GI:  Per HPI Heme:  No bleeding or bruising.     Transfusions:  None ever before  today Neuro:  No headaches, no peripheral tingling or numbness Derm:  No itching, no rash or sores.  Endocrine:  No sweats or chills.  No polyuria or dysuria Immunization:  Flu shot in 12/2015 Travel:  None beyond local counties in last few months.    PHYSICAL EXAM: Vital signs in last 24 hours: Vitals:   04/17/17 1107 04/17/17 1141  BP: (!) 149/86 (!) 152/79  Pulse: 79 83  Resp: 14 (!) 22  Temp: 98.8 F (37.1 C)    Wt Readings from Last 3 Encounters:  04/17/17 59 kg (130 lb)  01/25/17 55.2 kg (121 lb 9.6 oz)  10/26/16 56.1 kg (123 lb 9.6 oz)    General: pleasant, non-ill looking woman Head:  No asymmetry or swelling  Eyes:  No icterus or pallor Ears:  Not HOH  Nose:  No congestion or discharge Mouth:  clear Neck:  No mass, no JVD.   Lungs:  Clear bil.  No SOB or cough Heart: RRR.  No MRG.  S1, S2 present.   Abdomen:  Soft, NT, ND.  Active BS.  No HSM.  Chole scar present.   Rectal: deferred.  Per ED the stool is melenic/black and FOBT +   Musc/Skeltl: no joint swelling, redness or deformity Extremities:  No CCE  Neurologic:  Oriented x 3.  No limb weakness, no tremor.   Skin:  No rash, no sores Tattoos:  none Nodes:  No cervical adenopathy   Psych:  Pleasant, calm, cooperative  Intake/Output from previous day: No intake/output data recorded. Intake/Output this shift: No intake/output data recorded.  LAB RESULTS:  Recent Labs  04/17/17 0740  WBC 8.1  HGB 4.8*  HCT 14.6*  PLT 238   BMET Lab Results  Component Value Date   NA 140 04/17/2017   NA 142 01/25/2017   NA 140 10/26/2016   K 3.4 (L) 04/17/2017   K 3.7 01/25/2017   K 3.7 10/26/2016   CL 109 04/17/2017   CL 107 01/25/2017   CL 104 10/26/2016   CO2 24 04/17/2017   CO2 28 01/25/2017   CO2 28 10/26/2016   GLUCOSE 99 04/17/2017   GLUCOSE 79 01/25/2017   GLUCOSE 72 10/26/2016   BUN 30 (H) 04/17/2017   BUN 12 01/25/2017   BUN 11 10/26/2016   CREATININE  0.68 04/17/2017   CREATININE 0.79  01/25/2017   CREATININE 0.99 10/26/2016   CALCIUM 8.2 (L) 04/17/2017   CALCIUM 9.3 01/25/2017   CALCIUM 9.7 10/26/2016   LFT No results for input(s): PROT, ALBUMIN, AST, ALT, ALKPHOS, BILITOT, BILIDIR, IBILI in the last 72 hours. PT/INR No results found for: INR, PROTIME Hepatitis Panel No results for input(s): HEPBSAG, HCVAB, HEPAIGM, HEPBIGM in the last 72 hours. C-Diff No components found for: CDIFF Lipase  No results found for: LIPASE  Drugs of Abuse  No results found for: LABOPIA, COCAINSCRNUR, LABBENZ, AMPHETMU, THCU, LABBARB   RADIOLOGY STUDIES: Dg Chest 2 View  Result Date: 04/17/2017 CLINICAL DATA:  Onset of shortness of breath this morning. History of hypertension, hyperlipidemia, nonsmoker. EXAM: CHEST  2 VIEW COMPARISON:  Chest x-ray of Mar 19, 2011 FINDINGS: The lungs are adequately inflated. There is no focal infiltrate. There is stable calcified nodule in the right upper lobe measuring approximately 8 mm in diameter. There is no pleural effusion. The heart and pulmonary vascularity are normal. The mediastinum is normal in width. The bony thorax exhibits no acute abnormality. The gas pattern in the upper abdomen is normal. IMPRESSION: No acute cardiopulmonary abnormality is observed. Evidence of previous granulomatous infection. Electronically Signed   By: David  Martinique M.D.   On: 04/17/2017 08:33     IMPRESSION:   *  GIB.  Suspect blood loss from NSAID induced ulcer.    *   Normocytic anemia, due to blood loss.  Symptomatic with DOE, chest pain, peripheral tingling.  1 of 2 units transfused thus far  *  Azotemia due to GIB    PLAN:     *  EGD tomorrow.  Discussed details, risks, benefits with pt who agrees to proceed.   *  Start BID IV Protonix.  Ok to have clears tonite, NPO after midnight.   *  H/H tonite 7 PM.  CBC in AM.     Azucena Freed  04/17/2017, 11:45 AM Pager: [phone]   Attending physician's note   I have taken a history, examined the patient  and reviewed the chart. I agree with the Advanced Practitioner's note, impression and recommendations. Nepali speaking. Used Marketing executive Patient complaints of left upper quadrant abdominal pain and intermittent nausea and also has history of melena for past few days Presented with severe anemia, hemoglobin 4.8 in the setting of NSAID use We'll plan for EGD tomorrow to evaluate and intervene if needed The risks and benefits as well as alternatives of endoscopic procedure(s) have been discussed and reviewed. All questions answered. The patient agrees to proceed. Recheck hemoglobin tonight and transfuse to hemoglobin greater than 7 Continue PPI Nothing by mouth after midnight  K Denzil Magnuson, MD [phone] Mon-Fri 8a-5p [phone] after 5p, weekends, holidays

## 2017-04-18 NOTE — Progress Notes (Signed)
Subjective: NAEO. Patient has complaint of left arm pain after IV Potassium was started. She also endorses some mid-epigastric pain, better than yesterday. Otherwise urinating and having bowel movements appropriately, generally feeling well. No other acute complaints. Wants to go home.  History obtained with help of video interpreter and patients daughter.  Objective:  Vital signs in last 24 hours: Vitals:   04/18/17 0551 04/18/17 0807 04/18/17 0920 04/18/17 0929  BP: 122/80 (!) 155/90 123/82   Pulse: 80 74 98 80  Resp: 16 17 18 18   Temp: 98.4 F (36.9 C) 98.8 F (37.1 C)    TempSrc: Oral Oral    SpO2: 99% 100% 100% 100%  Weight:      Height:       Weight change:   Intake/Output Summary (Last 24 hours) at 04/18/17 1120 Last data filed at 04/18/17 0908  Gross per 24 hour  Intake              973 ml  Output                0 ml  Net              973 ml   General appearance: alert, cooperative and no distress Lungs: normal effort Heart: regular rate and rhythm, S1, S2 normal, no murmur, click, rub or gallop Abdomen: soft, bowel sounds normal; no masses,  no organomegaly, mild tenderness to palpation in mid-epigastric area, no rebound or guarding Extremities: extremities normal, atraumatic, no cyanosis or edema Pulses: 2+ and symmetric Skin: Skin color, texture, turgor normal. No rashes or lesions  Lab Results: Comprehensive metabolic panel (Abnormal) Collected: 04/18/17 0407  Specimen: Blood Updated: 04/18/17 0504   Sodium 140 mmol/L    Potassium 3.2 (L) mmol/L    Chloride 111 mmol/L    CO2 22 mmol/L    Glucose, Bld 91 mg/dL    BUN 26 (H) mg/dL    Creatinine, Ser 0.71 mg/dL    Calcium 8.1 (L) mg/dL    Total Protein 4.3 (L) g/dL    Albumin 2.5 (L) g/dL    AST 14 (L) U/L    ALT 9 (L) U/L    Alkaline Phosphatase 58 U/L    Total Bilirubin 1.2 mg/dL    GFR calc non Af Amer >60 mL/min    GFR calc Af Amer >60 mL/min    Anion gap 7  CBC (Abnormal) Collected: 04/18/17  0407  Specimen: Blood Updated: 04/18/17 0439   WBC 10.7 (H) K/uL    RBC 2.55 (L) MIL/uL    Hemoglobin 7.6 (L) g/dL    HCT 22.6 (L) %    MCV 88.6 fL    MCH 29.8 pg    MCHC 33.6 g/dL    RDW 15.2 %    Platelets 185 K/uL   Hemoglobin and hematocrit, blood (Abnormal) Collected: 04/17/17 1938  Specimen: Blood Updated: 04/17/17 2011   Hemoglobin 7.7 (L) g/dL    HCT 22.8 (L) %    EGD on 04/18/2017: Findings: The entire examined stomach was normal. Biopsies were taken with a cold forceps for histology. Biopsies were taken with a cold forceps for Helicobacter pylori testing. Two non-bleeding cratered duodenal ulcers with a visible vessel were found in the duodenal bulb. The largest lesion was 10 mm in largest dimension. Coagulation for bleeding prevention using bipolar probe was successful. For hemostasis, one hemostatic clip was successfully placed (MR conditional). There was no bleeding at the end of the procedure. Impression: - Normal esophagus. -  Normal stomach. Biopsied. - Multiple non-bleeding duodenal ulcers with a visible vessel. Treated with bipolar cautery. Clip (MR conditional) was placed  Assessment/Plan:  Principal Problem:   Symptomatic anemia Active Problems:   Dyslipidemia   Gastrointestinal hemorrhage associated with duodenal ulcer   Upper GI bleed  Alice Woodard is a 46 year old woman with hypertension, hyperlipidemia on hospital day 1 for symptomatic anemia from GI bleeding.  Upper GI bleeding: EGD showed 2 non-bleeding duodenal ulcers with a visible vessel that was cauterized and clipped. Likely source of GI bleed and resultant anemia. Biopsied, patient will need to f/u for results. Advised to stop Naproxen use. Hg responded appropriately to pRBCs transfusions, 7.6 this AM. Symptomatically stable. - Prescribe Omeprazole 40 mg twice daily   - Prescribe PO ferrous sulfate 325mg  daily - Follow up outpatient with PCP and GI  Hypokalemia: K 3.2 today, 3.4 yesterday.  Patient not tolerating IV K. - PO K 7mEq supplementation  - Resume PO supplementation at discharge.  GERD: PPI as noted above  Hyperlipidemia: Continue home Atovastatin 20 mg daily  Hypertension: Continue home Amlodipine 10 mg daily and HCTZ 25 mg daily.  Reports not taking her Losartan at admission.   Achilles tendonitis: Stop Naproxen 500 mg BID. Use Tylenol 650 mg q6h PRN and ice.  Outpatient PT or Sports Med follow up  Dispo: Today, f/u at [facility]   LOS: 1 day   Alice Woodard, Medical Student 04/18/2017, 11:20 AM   Attestation for Student Documentation:  I personally was present and performed or re-performed the history, physical exam and medical decision-making activities of this service and have verified that the service and findings are accurately documented in the student's note.  Jule Ser, DO 04/18/2017, 1:29 PM

## 2017-04-18 NOTE — Transfer of Care (Signed)
Immediate Anesthesia Transfer of Care Note  Patient: Alice Woodard  Procedure(s) Performed: Procedure(s): ESOPHAGOGASTRODUODENOSCOPY (EGD) (N/A)  Patient Location: Endoscopy Unit  Anesthesia Type:MAC  Level of Consciousness: awake and alert   Airway & Oxygen Therapy: Patient Spontanous Breathing and Patient connected to nasal cannula oxygen  Post-op Assessment: Report given to RN and Post -op Vital signs reviewed and stable  Post vital signs: Reviewed and stable  Last Vitals:  Vitals:   04/18/17 0551 04/18/17 0807  BP: 122/80 (!) 155/90  Pulse: 80 74  Resp: 16 17  Temp: 36.9 C 37.1 C    Last Pain:  Vitals:   04/18/17 0807  TempSrc: Oral  PainSc:          Complications: No apparent anesthesia complications

## 2017-04-19 ENCOUNTER — Encounter: Payer: Self-pay | Admitting: Family Medicine

## 2017-04-19 ENCOUNTER — Ambulatory Visit: Payer: Self-pay | Attending: Family Medicine | Admitting: Family Medicine

## 2017-04-19 VITALS — BP 130/76 | HR 75 | Temp 98.4°F | Wt 114.4 lb

## 2017-04-19 DIAGNOSIS — R21 Rash and other nonspecific skin eruption: Secondary | ICD-10-CM | POA: Insufficient documentation

## 2017-04-19 DIAGNOSIS — D649 Anemia, unspecified: Secondary | ICD-10-CM

## 2017-04-19 DIAGNOSIS — M7660 Achilles tendinitis, unspecified leg: Secondary | ICD-10-CM | POA: Insufficient documentation

## 2017-04-19 DIAGNOSIS — D500 Iron deficiency anemia secondary to blood loss (chronic): Secondary | ICD-10-CM | POA: Insufficient documentation

## 2017-04-19 DIAGNOSIS — E876 Hypokalemia: Secondary | ICD-10-CM | POA: Insufficient documentation

## 2017-04-19 DIAGNOSIS — I10 Essential (primary) hypertension: Secondary | ICD-10-CM

## 2017-04-19 DIAGNOSIS — G5601 Carpal tunnel syndrome, right upper limb: Secondary | ICD-10-CM

## 2017-04-19 DIAGNOSIS — K269 Duodenal ulcer, unspecified as acute or chronic, without hemorrhage or perforation: Secondary | ICD-10-CM

## 2017-04-19 DIAGNOSIS — B9681 Helicobacter pylori [H. pylori] as the cause of diseases classified elsewhere: Secondary | ICD-10-CM | POA: Diagnosis present

## 2017-04-19 DIAGNOSIS — E785 Hyperlipidemia, unspecified: Secondary | ICD-10-CM

## 2017-04-19 DIAGNOSIS — K219 Gastro-esophageal reflux disease without esophagitis: Secondary | ICD-10-CM | POA: Insufficient documentation

## 2017-04-19 LAB — BPAM RBC
BLOOD PRODUCT EXPIRATION DATE: 201806202359
BLOOD PRODUCT EXPIRATION DATE: 201806202359
Blood Product Expiration Date: 201806202359
Blood Product Expiration Date: 201806202359
ISSUE DATE / TIME: 201806030258
ISSUE DATE / TIME: 201806061030
ISSUE DATE / TIME: 201806061450
UNIT TYPE AND RH: 6200
Unit Type and Rh: 6200
Unit Type and Rh: 6200
Unit Type and Rh: 6200

## 2017-04-19 LAB — TYPE AND SCREEN
ABO/RH(D): A POS
ANTIBODY SCREEN: NEGATIVE
Unit division: 0
Unit division: 0
Unit division: 0
Unit division: 0

## 2017-04-19 MED ORDER — ATORVASTATIN CALCIUM 20 MG PO TABS: 20.0000 mg | ORAL_TABLET | Freq: Every day | ORAL | 3 refills | Status: DC

## 2017-04-19 MED ORDER — AMOXICILLIN 500 MG PO CAPS: 1000.0000 mg | ORAL_CAPSULE | Freq: Two times a day (BID) | ORAL | 0 refills | Status: DC

## 2017-04-19 MED ORDER — CLARITHROMYCIN 500 MG PO TABS: 500.0000 mg | ORAL_TABLET | Freq: Two times a day (BID) | ORAL | 0 refills | Status: DC

## 2017-04-19 MED ORDER — FERROUS SULFATE 325 (65 FE) MG PO TABS: 325.0000 mg | ORAL_TABLET | Freq: Every day | ORAL | 0 refills | Status: DC

## 2017-04-19 MED ORDER — GABAPENTIN 300 MG PO CAPS: 300.0000 mg | ORAL_CAPSULE | Freq: Three times a day (TID) | ORAL | 3 refills | Status: DC

## 2017-04-19 MED ORDER — AMLODIPINE BESYLATE 10 MG PO TABS: 10.0000 mg | ORAL_TABLET | Freq: Every day | ORAL | 3 refills | Status: DC

## 2017-04-19 MED ORDER — LISINOPRIL 5 MG PO TABS: 5.0000 mg | ORAL_TABLET | Freq: Every day | ORAL | 3 refills | Status: DC

## 2017-04-19 MED FILL — GABAPENTIN 300 MG CAPSULE: 300 | 20 days supply | Qty: 60 | Fill #0

## 2017-04-19 MED FILL — ATORVASTATIN 20 MG TABLET: 20 | 30 days supply | Qty: 30 | Fill #0

## 2017-04-19 MED FILL — AMOXICILLIN 500 MG CAPSULE: 500 | 14 days supply | Qty: 56 | Fill #0

## 2017-04-19 MED FILL — ?CLARITHROMYCIN 500 MG TABS: 500 MG | 14 days supply | Qty: 28 | Fill #0

## 2017-04-19 MED FILL — AMLODIPINE BESYLATE 10 MG T: 10 | 30 days supply | Qty: 30 | Fill #0

## 2017-04-19 MED FILL — LISINOPRIL 5 MG TAB: 5 | 30 days supply | Qty: 30 | Fill #0

## 2017-04-19 NOTE — Patient Instructions (Signed)
Peptic Ulcer  A peptic ulcer is a sore in the lining of the esophagus (esophageal ulcer), the stomach (gastric ulcer), or the first part of the small intestine (duodenal ulcer). The ulcer causes gradual wearing away (erosion) into the deeper tissue.  What are the causes?  Normally, the lining of the stomach and the small intestine protects itself from the acid that digests food. The protective lining can be damaged by:   An infection caused by a germ (bacterium) called Helicobacter pylori or H. pylori.   Regular use of NSAIDs, such as ibuprofen or aspirin.   Rare tumors in the stomach, small intestine, or pancreas (Zollinger-Ellison syndrome).    What increases the risk?  The following factors may make you more likely to develop this condition:   Smoking.   Having a family history of ulcer disease.    What are the signs or symptoms?  Symptoms of this condition include:   Burning pain or gnawing in the area between the chest and the belly button. The pain may be worse on an empty stomach and at night.   Heartburn.   Nausea and vomiting.   Bloating.    If the ulcer results in bleeding, it can cause:   Black, tarry stools.   Vomiting of bright red blood.   Vomiting of material that looks like coffee grounds.    How is this diagnosed?  This condition may be diagnosed based on:   Medical history and physical exam.   Various tests or procedures, such as:  ? Blood tests, stool tests, or breath tests to check for the H. pylori bacterium.  ? An X-ray exam (upper gastrointestinal series) of the esophagus, stomach, and small intestine.  ? Upper endoscopy. The health care provider examines the esophagus, stomach, and small intestine using a small flexible tube that has a video camera at the end.  ? Biopsy. A tissue sample is removed to be examined under a microscope.    How is this treated?  Treatment for this condition may include:   Eliminating the cause of the ulcer, such as smoking or the use of NSAIDs or  alcohol.   Medicines to reduce the amount of acid in your digestive tract.   Antibiotic medicines, if the ulcer is caused by the H. pylori bacterium.   An upper endoscopy to treat a bleeding ulcer.   Surgery, if the bleeding is severe or if the ulcer created a hole somewhere in the digestive system.    Follow these instructions at home:   Avoid alcohol and caffeine.   Do not use any tobacco products, such as cigarettes, chewing tobacco, and e-cigarettes. If you need help quitting, ask your health care provider.   Take over-the-counter and prescription medicines only as told by your health care provider. Do not use over-the-counter medicines in place of prescription medicines unless your health care provider approves.   Keep all follow-up visits as told by your health care provider. This is important.  Contact a health care provider if:   Your symptoms do not improve within 7 days of starting treatment.   You have ongoing indigestion or heartburn.  Get help right away if:   You have sudden, sharp, or persistent pain in your abdomen.   You have bloody or dark black, tarry stools.   You vomit blood or material that looks like coffee grounds.   You become light-headed or you feel faint.   You become weak.   You become sweaty or clammy.    This information is not intended to replace advice given to you by your health care provider. Make sure you discuss any questions you have with your health care provider.  Document Released: 10/26/2000 Document Revised: 04/02/2016 Document Reviewed: 07/30/2015  Elsevier Interactive Patient Education  2018 [employer]

## 2017-04-19 NOTE — Progress Notes (Signed)
Subjective:  Patient ID: Alice Woodard, female    DOB: [birth year]  Age: 46 y.o. MRN: [id number]  CC: Hospitalization Follow-up   HPI Alice Woodard is a 46 year old female with a history of hypertension, Achilles tendinitis, dyslipidemia, GERD who presents today for a follow up from hospitalization at [facility] from 04/17/17 through 04/18/17 for symptomatic anemia secondary to upper GI bleed from duodenal ulcer.  She had presented with a hemoglobin of 4.8 for which she received transfusion of packed red blood cells. She underwent EGD which revealed 2 nonbleeding duodenal ulcers with a visible vessel which was cauterized and clipped. NSAIDs were discontinued and she was placed on Tylenol for Achilles tendinitis. Labs also revealed hypokalemia which was repleted. She was subsequently discharged with a hemoglobin of 7.6 on ferrous sulfate and omeprazole. Review of chart indicates biopsy was positive for H. pylori    She is accompanied by her daughter today and seen with a video [place] interpreter. She endorses epigastric pain and continues to have black stools;  denies lightheadedness. She does not have an upcoming appointment with gastroenterology.  Past Medical History:  Diagnosis Date  . Anemia   . History of blood transfusion 04/17/2017   "anemic"  . Hyperlipidemia   . Hypertension     Past Surgical History:  Procedure Laterality Date  . APPENDECTOMY    . CHOLECYSTECTOMY OPEN      Allergies  Allergen Reactions  . Amlodipine Rash  . Atorvastatin Rash     Outpatient Medications Prior to Visit  Medication Sig Dispense Refill  . acetaminophen (TYLENOL) 325 MG tablet Take 2 tablets (650 mg total) by mouth every 6 (six) hours as needed for mild pain (or Fever >/= 101). 60 tablet 0  . pantoprazole (PROTONIX) 40 MG tablet Take 1 tablet (40 mg total) by mouth 2 (two) times daily. 120 tablet 0  . potassium chloride (K-DUR) 10 MEQ tablet Take 1 tablet (10 mEq total) by mouth daily.  30 tablet 2  . amLODipine (NORVASC) 10 MG tablet Take 1 tablet (10 mg total) by mouth daily. 90 tablet 1  . atorvastatin (LIPITOR) 20 MG tablet Take 1 tablet (20 mg total) by mouth daily. 90 tablet 1  . ferrous sulfate 325 (65 FE) MG tablet Take 1 tablet (325 mg total) by mouth daily. 60 tablet 0  . gabapentin (NEURONTIN) 300 MG capsule Take 1 capsule (300 mg total) by mouth 3 (three) times daily. 60 capsule 3  . hydrochlorothiazide (HYDRODIURIL) 25 MG tablet Take 1 tablet (25 mg total) by mouth daily. 90 tablet 1   No facility-administered medications prior to visit.     ROS Review of Systems  Constitutional: Negative for activity change, appetite change and fatigue.  HENT: Negative for congestion, sinus pressure and sore throat.   Eyes: Negative for visual disturbance.  Respiratory: Negative for cough, chest tightness, shortness of breath and wheezing.   Cardiovascular: Negative for chest pain and palpitations.  Gastrointestinal: Positive for abdominal pain. Negative for abdominal distention and constipation.  Endocrine: Negative for polydipsia.  Genitourinary: Negative for dysuria and frequency.  Musculoskeletal: Negative for arthralgias and back pain.  Skin: Negative for rash.  Neurological: Negative for tremors, light-headedness and numbness.  Hematological: Does not bruise/bleed easily.  Psychiatric/Behavioral: Negative for agitation and behavioral problems.    Objective:  BP 130/76   Pulse 75   Temp 98.4 F (36.9 C) (Oral)   Wt 114 lb 6.4 oz (51.9 kg)   LMP 11/15/2014   SpO2  100%   BMI 22.34 kg/m   BP/Weight 04/19/2017 03/15/6502 03/16/6567  Systolic BP 127 517 -  Diastolic BP 76 82 -  Wt. (Lbs) 114.4 - 130  BMI 22.34 - 25.39      Physical Exam  Constitutional: She is oriented to person, place, and time. She appears well-developed and well-nourished.  Cardiovascular: Normal rate, normal heart sounds and intact distal pulses.   No murmur heard. Pulmonary/Chest:  Effort normal and breath sounds normal. She has no wheezes. She has no rales. She exhibits no tenderness.  Abdominal: Soft. Bowel sounds are normal. She exhibits no distension and no mass. There is tenderness (pigastric tenderness).  Musculoskeletal: Normal range of motion.  Neurological: She is alert and oriented to person, place, and time.  Skin: Skin is warm and dry.  Psychiatric: She has a normal mood and affect.    CBC    Component Value Date/Time   WBC 10.7 (H) 04/18/2017 0407   RBC 2.55 (L) 04/18/2017 0407   HGB 7.6 (L) 04/18/2017 0407   HCT 22.6 (L) 04/18/2017 0407   PLT 185 04/18/2017 0407   MCV 88.6 04/18/2017 0407   MCH 29.8 04/18/2017 0407   MCHC 33.6 04/18/2017 0407   RDW 15.2 04/18/2017 0407   LYMPHSABS 2.6 12/29/2015 1126   MONOABS 0.6 12/29/2015 1126   EOSABS 0.2 12/29/2015 1126   BASOSABS 0.0 12/29/2015 1126    CMP Latest Ref Rng & Units 04/18/2017 04/17/2017 01/25/2017  Glucose 65 - 99 mg/dL 91 99 79  BUN 6 - 20 mg/dL 26(H) 30(H) 12  Creatinine 0.44 - 1.00 mg/dL 0.71 0.68 0.79  Sodium 135 - 145 mmol/L 140 140 142  Potassium 3.5 - 5.1 mmol/L 3.2(L) 3.4(L) 3.7  Chloride 101 - 111 mmol/L 111 109 107  CO2 22 - 32 mmol/L 22 24 28   Calcium 8.9 - 10.3 mg/dL 8.1(L) 8.2(L) 9.3  Total Protein 6.5 - 8.1 g/dL 4.3(L) 5.0(L) 7.3  Total Bilirubin 0.3 - 1.2 mg/dL 1.2 1.0 0.9  Alkaline Phos 38 - 126 U/L 58 67 105  AST 15 - 41 U/L 14(L) 19 20  ALT 14 - 54 U/L 9(L) 12(L) 12      Assessment & Plan:   1. Ulcer of the duodenum caused by bacteria (H. pylori) Placed on amoxicillin, Biaxin which she will take along with omeprazole - CBC with Differential/Platelet; Future - Ambulatory referral to Gastroenterology  2. Essential hypertension, benign Discontinue hydrochlorothiazide which could be the culprit causing hypokalemia Commence lisinopril We'll check basic metabolic panel 1 week - lisinopril (PRINIVIL,ZESTRIL) 5 MG tablet; Take 1 tablet (5 mg total) by mouth daily.   Dispense: 30 tablet; Refill: 3 - Basic Metabolic Panel; Future - amLODipine (NORVASC) 10 MG tablet; Take 1 tablet (10 mg total) by mouth daily.  Dispense: 30 tablet; Refill: 3  3. Dyslipidemia Stable  Discharge reveals rash as an allergic reaction to atorvastatin however she has been taking this medication for a while with no complaints Unsure if this is a true allergy - atorvastatin (LIPITOR) 20 MG tablet; Take 1 tablet (20 mg total) by mouth daily.  Dispense: 30 tablet; Refill: 3  4. Carpal tunnel syndrome of right wrist - gabapentin (NEURONTIN) 300 MG capsule; Take 1 capsule (300 mg total) by mouth 3 (three) times daily.  Dispense: 60 capsule; Refill: 3  5. Symptomatic anemia Secondary to upper GI bleed Hemoglobin at discharge was 7.6 Continue ferrous sulfate   Meds ordered this encounter  Medications  . lisinopril (PRINIVIL,ZESTRIL) 5 MG  tablet    Sig: Take 1 tablet (5 mg total) by mouth daily.    Dispense:  30 tablet    Refill:  3    Discontinue hydrochlorothiazide  . amoxicillin (AMOXIL) 500 MG capsule    Sig: Take 2 capsules (1,000 mg total) by mouth 2 (two) times daily.    Dispense:  56 capsule    Refill:  0  . clarithromycin (BIAXIN) 500 MG tablet    Sig: Take 1 tablet (500 mg total) by mouth 2 (two) times daily.    Dispense:  28 tablet    Refill:  0  . ferrous sulfate 325 (65 FE) MG tablet    Sig: Take 1 tablet (325 mg total) by mouth daily.    Dispense:  60 tablet    Refill:  0  . amLODipine (NORVASC) 10 MG tablet    Sig: Take 1 tablet (10 mg total) by mouth daily.    Dispense:  30 tablet    Refill:  3  . atorvastatin (LIPITOR) 20 MG tablet    Sig: Take 1 tablet (20 mg total) by mouth daily.    Dispense:  30 tablet    Refill:  3  . gabapentin (NEURONTIN) 300 MG capsule    Sig: Take 1 capsule (300 mg total) by mouth 3 (three) times daily.    Dispense:  60 capsule    Refill:  3    Discontinue previous dose    Follow-up: Return in about 1 month (around  05/19/2017), or if symptoms worsen or fail to improve, for Follow-up of peptic ulcer.   Arnoldo Morale MD

## 2017-04-22 ENCOUNTER — Encounter (HOSPITAL_COMMUNITY): Payer: Self-pay | Admitting: Gastroenterology

## 2017-04-25 ENCOUNTER — Other Ambulatory Visit (INDEPENDENT_AMBULATORY_CARE_PROVIDER_SITE_OTHER): Payer: Self-pay

## 2017-04-25 ENCOUNTER — Ambulatory Visit (INDEPENDENT_AMBULATORY_CARE_PROVIDER_SITE_OTHER): Payer: Self-pay | Admitting: Gastroenterology

## 2017-04-25 ENCOUNTER — Encounter: Payer: Self-pay | Admitting: Gastroenterology

## 2017-04-25 VITALS — BP 92/68 | HR 72 | Ht <= 58 in | Wt 112.5 lb

## 2017-04-25 DIAGNOSIS — K269 Duodenal ulcer, unspecified as acute or chronic, without hemorrhage or perforation: Secondary | ICD-10-CM

## 2017-04-25 DIAGNOSIS — D62 Acute posthemorrhagic anemia: Secondary | ICD-10-CM

## 2017-04-25 DIAGNOSIS — Z8619 Personal history of other infectious and parasitic diseases: Secondary | ICD-10-CM

## 2017-04-25 LAB — CBC
MCHC: 33.4 g/dL (ref 30.0–36.0)
MCV: 91.5 fl (ref 78.0–100.0)
PLATELETS: 394 10*3/uL (ref 150.0–400.0)
RBC: 2.81 Mil/uL — AB (ref 3.87–5.11)
RDW: 15.7 % — ABNORMAL HIGH (ref 11.5–15.5)
WBC: 12.9 10*3/uL — ABNORMAL HIGH (ref 4.0–10.5)

## 2017-04-25 NOTE — Progress Notes (Deleted)
Alice Woodard    [id number]    April 23, 1971  Primary Care Physician:Amao, Charlane Ferretti, MD  Referring Physician: Arnoldo Morale, [place], [place] [address]  Chief complaint:  Duoodenal ulcer , melena, upper gi bleed  HPI: She is feeling better has mild discomfort in RUQ and epigastric pain    Outpatient Encounter Prescriptions as of 04/25/2017  Medication Sig  . acetaminophen (TYLENOL) 325 MG tablet Take 2 tablets (650 mg total) by mouth every 6 (six) hours as needed for mild pain (or Fever >/= 101).  Marland Kitchen amLODipine (NORVASC) 10 MG tablet Take 1 tablet (10 mg total) by mouth daily.  Marland Kitchen amoxicillin (AMOXIL) 500 MG capsule Take 2 capsules (1,000 mg total) by mouth 2 (two) times daily.  Marland Kitchen atorvastatin (LIPITOR) 20 MG tablet Take 1 tablet (20 mg total) by mouth daily.  . clarithromycin (BIAXIN) 500 MG tablet Take 1 tablet (500 mg total) by mouth 2 (two) times daily.  . ferrous sulfate 325 (65 FE) MG tablet Take 1 tablet (325 mg total) by mouth daily.  Marland Kitchen gabapentin (NEURONTIN) 300 MG capsule Take 1 capsule (300 mg total) by mouth 3 (three) times daily.  Marland Kitchen lisinopril (PRINIVIL,ZESTRIL) 5 MG tablet Take 1 tablet (5 mg total) by mouth daily.  . pantoprazole (PROTONIX) 40 MG tablet Take 1 tablet (40 mg total) by mouth 2 (two) times daily.  . potassium chloride (K-DUR) 10 MEQ tablet Take 1 tablet (10 mEq total) by mouth daily.   No facility-administered encounter medications on file as of 04/25/2017.     Allergies as of 04/25/2017 - Review Complete 04/25/2017  Allergen Reaction Noted  . Amlodipine Rash 04/17/2017  . Atorvastatin Rash 04/17/2017    Past Medical History:  Diagnosis Date  . Anemia   . History of blood transfusion 04/17/2017   "anemic"  . Hyperlipidemia   . Hypertension     Past Surgical History:  Procedure Laterality Date  . APPENDECTOMY    . CHOLECYSTECTOMY OPEN    . ESOPHAGOGASTRODUODENOSCOPY N/A 04/18/2017   Procedure:  ESOPHAGOGASTRODUODENOSCOPY (EGD);  Surgeon: Mauri Pole, MD;  Location[facility] ENDOSCOPY;  Service: Endoscopy;  Laterality: N/A;    History reviewed. No pertinent family history.  Social History   Social History  . Marital status: Single    Spouse name: N/A  . Number of children: N/A  . Years of education: N/A   Occupational History  . Not on file.   Social History Main Topics  . Smoking status: Never Smoker  . Smokeless tobacco: Never Used  . Alcohol use No  . Drug use: No  . Sexual activity: Not on file   Other Topics Concern  . Not on file   Social History Narrative  . No narrative on file      Review of systems: Review of Systems  Constitutional: Negative for fever and chills.  HENT: Negative.   Eyes: Negative for blurred vision.  Respiratory: Negative for cough, shortness of breath and wheezing.   Cardiovascular: Negative for chest pain and palpitations.  Gastrointestinal: as per HPI Genitourinary: Negative for dysuria, urgency, frequency and hematuria.  Musculoskeletal: Negative for myalgias, back pain and joint pain.  Skin: Negative for itching and rash.  Neurological: Negative for dizziness, tremors, focal weakness, seizures and loss of consciousness.  Endo/Heme/Allergies: Positive for seasonal allergies.  Psychiatric/Behavioral: Negative for depression, suicidal ideas and hallucinations.  All other systems reviewed and are negative.   Physical Exam: Vitals:  04/25/17 1341  BP: 92/68  Pulse: 72   Body mass index is 23.51 kg/m. Gen:      No acute distress HEENT:  EOMI, sclera anicteric Neck:     No masses; no thyromegaly Lungs:    Clear to auscultation bilaterally; normal respiratory effort CV:         Regular rate and rhythm; no murmurs Abd:      + bowel sounds; soft, non-tender; no palpable masses, no distension Ext:    No edema; adequate peripheral perfusion Skin:      Warm and dry; no rash Neuro: alert and oriented x 3 Psych: normal  mood and affect  Data Reviewed:  Reviewed labs, radiology imaging, old records and pertinent past GI work up   Assessment and Plan/Recommendations:  ***  25 minutes was spent face-to-face with the patient. Greater than 50% of the time used for counseling as well as treatment plan and follow-up. She had multiple questions which were answered to her satisfaction  K. Denzil Magnuson , MD [phone] Mon-Fri 8a-5p [phone] after 5p, weekends, holidays  CC: Arnoldo Morale, MD

## 2017-04-25 NOTE — Progress Notes (Signed)
Alice Woodard    [id number]    1971/03/06  Primary Care Physician:Amao, Charlane Ferretti, MD  Referring Physician: Arnoldo Morale, [place] [address], [place] [address]  Chief complaint:  Epigastric abdominal pain, Peptic ulcer disease, H. pylori  HPI:  46 year old female [place] speaking accompanied by her daughter, used Marketing executive to interpret here for follow-up visit after recent hospitalization last week 04/17/2017 to 04/19/2017. Patient was admitted with severe anemia secondary to upper GI hemorrhage due to multiple duodenal ulcers in the setting of NSAID's and H. pylori infection. Patient is currently taking PPI twice daily and also adding treated for H. Pylori. She complains of persistent intermittent epigastric abdominal pain, though improved compared to last week. Denies any nausea or vomiting, tolerating diet well. She feels her stool is getting lighter in color. She continues to feel tired and lethargic. No nausea, vomiting, diarrhea, melena or blood per rectum. Denies taking any nsaids after discharge   Outpatient Encounter Prescriptions as of 04/25/2017  Medication Sig  . acetaminophen (TYLENOL) 325 MG tablet Take 2 tablets (650 mg total) by mouth every 6 (six) hours as needed for mild pain (or Fever >/= 101).  Marland Kitchen amLODipine (NORVASC) 10 MG tablet Take 1 tablet (10 mg total) by mouth daily.  Marland Kitchen amoxicillin (AMOXIL) 500 MG capsule Take 2 capsules (1,000 mg total) by mouth 2 (two) times daily.  Marland Kitchen atorvastatin (LIPITOR) 20 MG tablet Take 1 tablet (20 mg total) by mouth daily.  . clarithromycin (BIAXIN) 500 MG tablet Take 1 tablet (500 mg total) by mouth 2 (two) times daily.  . ferrous sulfate 325 (65 FE) MG tablet Take 1 tablet (325 mg total) by mouth daily.  Marland Kitchen gabapentin (NEURONTIN) 300 MG capsule Take 1 capsule (300 mg total) by mouth 3 (three) times daily.  Marland Kitchen lisinopril (PRINIVIL,ZESTRIL) 5 MG tablet Take 1 tablet (5 mg total) by mouth daily.  . pantoprazole  (PROTONIX) 40 MG tablet Take 1 tablet (40 mg total) by mouth 2 (two) times daily.  . potassium chloride (K-DUR) 10 MEQ tablet Take 1 tablet (10 mEq total) by mouth daily.   No facility-administered encounter medications on file as of 04/25/2017.     Allergies as of 04/25/2017 - Review Complete 04/25/2017  Allergen Reaction Noted  . Amlodipine Rash 04/17/2017  . Atorvastatin Rash 04/17/2017    Past Medical History:  Diagnosis Date  . Anemia   . History of blood transfusion 04/17/2017   "anemic"  . Hyperlipidemia   . Hypertension     Past Surgical History:  Procedure Laterality Date  . APPENDECTOMY    . CHOLECYSTECTOMY OPEN    . ESOPHAGOGASTRODUODENOSCOPY N/A 04/18/2017   Procedure: ESOPHAGOGASTRODUODENOSCOPY (EGD);  Surgeon: Mauri Pole, MD;  Location: [facility] ENDOSCOPY;  Service: Endoscopy;  Laterality: N/A;    History reviewed. No pertinent family history.  Social History   Social History  . Marital status: Single    Spouse name: N/A  . Number of children: N/A  . Years of education: N/A   Occupational History  . Not on file.   Social History Main Topics  . Smoking status: Never Smoker  . Smokeless tobacco: Never Used  . Alcohol use No  . Drug use: No  . Sexual activity: Not on file   Other Topics Concern  . Not on file   Social History Narrative  . No narrative on file      Review of systems: Review of Systems  Constitutional: Negative for fever and chills.  HENT: Negative.   Eyes: Negative for blurred vision.  Respiratory: Negative for cough, shortness of breath and wheezing.   Cardiovascular: Negative for chest pain and palpitations.  Gastrointestinal: as per HPI Genitourinary: Negative for dysuria, urgency, frequency and hematuria.  Musculoskeletal: Negative for myalgias, back pain and joint pain.  Skin: Negative for itching and rash.  Neurological: Negative for dizziness, tremors, focal weakness, seizures and loss of consciousness.    Endo/Heme/Allergies: Positive for seasonal allergies.  Psychiatric/Behavioral: Negative for depression, suicidal ideas and hallucinations.  All other systems reviewed and are negative.   Physical Exam: Vitals:   04/25/17 1341  BP: 92/68  Pulse: 72   Body mass index is 23.51 kg/m. Gen:      No acute distress HEENT:  EOMI, sclera anicteric Neck:     No masses; no thyromegaly Lungs:    Clear to auscultation bilaterally; normal respiratory effort CV:         Regular rate and rhythm; no murmurs Abd:      + bowel sounds; soft, non-tender; no palpable masses, no distension Ext:    No edema; adequate peripheral perfusion Skin:      Warm and dry; no rash Neuro: alert and oriented x 3 Psych: normal mood and affect  Data Reviewed:  Reviewed labs, radiology imaging, old records and pertinent past GI work up   Assessment and Plan/Recommendations:  46 year old female with multiple duodenal ulcers secondary to NSAID's and H. pylori infection, status post recent hospitalization here for follow-up visit  Complete the treatment for H. pylori eradication Continue Protonix twice daily for 2 months We will check for H. [email or web address]://[email or web address].[email or web address]-rogers.info/ in 2 months, off PPI for 2 weeks prior to the testing Continue oral ferrous sulfate 3 times daily with meals Avoid nsaids  Return in 2-3 months or sooner if needed  15 minutes was spent face-to-face with the patient. Greater than 50% of the time used for counseling as well as treatment plan and follow-up. She had multiple questions which were answered to her satisfaction  K. Denzil Magnuson , MD [phone] Mon-Fri 8a-5p [phone] after 5p, weekends, holidays  CC: Arnoldo Morale, MD

## 2017-04-26 ENCOUNTER — Ambulatory Visit: Payer: Self-pay | Attending: Family Medicine

## 2017-04-26 DIAGNOSIS — I10 Essential (primary) hypertension: Secondary | ICD-10-CM | POA: Insufficient documentation

## 2017-04-26 DIAGNOSIS — B9681 Helicobacter pylori [H. pylori] as the cause of diseases classified elsewhere: Secondary | ICD-10-CM | POA: Insufficient documentation

## 2017-04-26 DIAGNOSIS — K269 Duodenal ulcer, unspecified as acute or chronic, without hemorrhage or perforation: Secondary | ICD-10-CM | POA: Insufficient documentation

## 2017-04-26 MED FILL — ?PANTOPRAZOLE SOD DR 40MG: 40 MG | 30 days supply | Qty: 60 | Fill #0

## 2017-04-26 NOTE — Progress Notes (Signed)
Patient here for lab visit only 

## 2017-04-27 LAB — CBC WITH DIFFERENTIAL/PLATELET
BASOS ABS: 0 10*3/uL (ref 0.0–0.2)
Basos: 0 %
EOS (ABSOLUTE): 0.1 10*3/uL (ref 0.0–0.4)
Eos: 1 %
Hematocrit: 25.1 % — ABNORMAL LOW (ref 34.0–46.6)
Hemoglobin: 8.2 g/dL — ABNORMAL LOW (ref 11.1–15.9)
IMMATURE GRANS (ABS): 0 10*3/uL (ref 0.0–0.1)
IMMATURE GRANULOCYTES: 0 %
LYMPHS: 22 %
Lymphocytes Absolute: 2.2 10*3/uL (ref 0.7–3.1)
MCH: 30.3 pg (ref 26.6–33.0)
MCHC: 32.7 g/dL (ref 31.5–35.7)
MCV: 93 fL (ref 79–97)
Monocytes Absolute: 0.8 10*3/uL (ref 0.1–0.9)
Monocytes: 8 %
NEUTROS PCT: 69 %
Neutrophils Absolute: 6.7 10*3/uL (ref 1.4–7.0)
PLATELETS: 394 10*3/uL — AB (ref 150–379)
RBC: 2.71 x10E6/uL — CL (ref 3.77–5.28)
RDW: 15.3 % (ref 12.3–15.4)
WBC: 9.8 10*3/uL (ref 3.4–10.8)

## 2017-04-27 LAB — BASIC METABOLIC PANEL
BUN/Creatinine Ratio: 9 (ref 9–23)
BUN: 8 mg/dL (ref 6–24)
CALCIUM: 8.5 mg/dL — AB (ref 8.7–10.2)
CHLORIDE: 103 mmol/L (ref 96–106)
CO2: 23 mmol/L (ref 20–29)
Creatinine, Ser: 0.88 mg/dL (ref 0.57–1.00)
GFR calc Af Amer: 90 mL/min/{1.73_m2} (ref >59)
GFR, EST NON AFRICAN AMERICAN: 78 mL/min/{1.73_m2} (ref >59)
Glucose: 77 mg/dL (ref 65–99)
POTASSIUM: 4.5 mmol/L (ref 3.5–5.2)
Sodium: 139 mmol/L (ref 134–144)

## 2017-04-29 ENCOUNTER — Other Ambulatory Visit: Payer: Self-pay | Admitting: Family Medicine

## 2017-04-29 DIAGNOSIS — K922 Gastrointestinal hemorrhage, unspecified: Secondary | ICD-10-CM

## 2017-05-03 ENCOUNTER — Ambulatory Visit: Payer: Self-pay | Attending: Family Medicine

## 2017-05-03 DIAGNOSIS — K922 Gastrointestinal hemorrhage, unspecified: Secondary | ICD-10-CM | POA: Insufficient documentation

## 2017-05-03 NOTE — Progress Notes (Signed)
Patient here for lab visit only 

## 2017-05-04 LAB — CBC WITH DIFFERENTIAL/PLATELET
BASOS ABS: 0 10*3/uL (ref 0.0–0.2)
Basos: 1 %
EOS (ABSOLUTE): 0.1 10*3/uL (ref 0.0–0.4)
Eos: 2 %
HEMOGLOBIN: 10 g/dL — AB (ref 11.1–15.9)
Hematocrit: 30.3 % — ABNORMAL LOW (ref 34.0–46.6)
Immature Grans (Abs): 0 10*3/uL (ref 0.0–0.1)
Immature Granulocytes: 0 %
LYMPHS ABS: 1.5 10*3/uL (ref 0.7–3.1)
Lymphs: 29 %
MCH: 29.9 pg (ref 26.6–33.0)
MCHC: 33 g/dL (ref 31.5–35.7)
MCV: 90 fL (ref 79–97)
MONOS ABS: 0.3 10*3/uL (ref 0.1–0.9)
Monocytes: 6 %
NEUTROS ABS: 3.3 10*3/uL (ref 1.4–7.0)
Neutrophils: 62 %
Platelets: 490 10*3/uL — ABNORMAL HIGH (ref 150–379)
RBC: 3.35 x10E6/uL — ABNORMAL LOW (ref 3.77–5.28)
RDW: 14.5 % (ref 12.3–15.4)
WBC: 5.3 10*3/uL (ref 3.4–10.8)

## 2017-05-13 ENCOUNTER — Telehealth: Payer: Self-pay

## 2017-05-13 MED FILL — GABAPENTIN 300 MG CAPSULE: 300 | 20 days supply | Qty: 60 | Fill #1

## 2017-05-13 NOTE — Telephone Encounter (Signed)
error 

## 2017-05-16 ENCOUNTER — Telehealth: Payer: Self-pay

## 2017-05-16 NOTE — Telephone Encounter (Signed)
Pt's daughter was called and informed of lab results.

## 2017-05-20 MED FILL — ?LISINOPRIL 5 MG TABLET: 5 | 30 days supply | Qty: 30 | Fill #1

## 2017-05-20 MED FILL — ?AMLODIPINE BESYLATE 10 MG: 10 | 30 days supply | Qty: 30 | Fill #1

## 2017-05-20 MED FILL — ?ATORVASTATIN 20 MG TABLET: 20 | 30 days supply | Qty: 30 | Fill #1

## 2017-05-20 MED FILL — FERROUS SULFATE 325 MG TAB: 325 (65 FE) | 30 days supply | Qty: 30 | Fill #1

## 2017-05-30 ENCOUNTER — Encounter: Payer: Self-pay | Admitting: Family Medicine

## 2017-05-30 ENCOUNTER — Ambulatory Visit: Payer: Self-pay | Attending: Family Medicine | Admitting: Family Medicine

## 2017-05-30 VITALS — BP 135/88 | HR 55 | Temp 98.5°F | Resp 16 | Ht 60.0 in | Wt 110.8 lb

## 2017-05-30 DIAGNOSIS — Z888 Allergy status to other drugs, medicaments and biological substances status: Secondary | ICD-10-CM | POA: Insufficient documentation

## 2017-05-30 DIAGNOSIS — K269 Duodenal ulcer, unspecified as acute or chronic, without hemorrhage or perforation: Secondary | ICD-10-CM | POA: Insufficient documentation

## 2017-05-30 DIAGNOSIS — R1011 Right upper quadrant pain: Secondary | ICD-10-CM | POA: Insufficient documentation

## 2017-05-30 DIAGNOSIS — E785 Hyperlipidemia, unspecified: Secondary | ICD-10-CM | POA: Insufficient documentation

## 2017-05-30 DIAGNOSIS — E876 Hypokalemia: Secondary | ICD-10-CM | POA: Insufficient documentation

## 2017-05-30 DIAGNOSIS — B9681 Helicobacter pylori [H. pylori] as the cause of diseases classified elsewhere: Secondary | ICD-10-CM

## 2017-05-30 DIAGNOSIS — Z79899 Other long term (current) drug therapy: Secondary | ICD-10-CM | POA: Insufficient documentation

## 2017-05-30 DIAGNOSIS — I10 Essential (primary) hypertension: Secondary | ICD-10-CM | POA: Insufficient documentation

## 2017-05-30 DIAGNOSIS — K5900 Constipation, unspecified: Secondary | ICD-10-CM | POA: Insufficient documentation

## 2017-05-30 DIAGNOSIS — D649 Anemia, unspecified: Secondary | ICD-10-CM | POA: Insufficient documentation

## 2017-05-30 DIAGNOSIS — K5909 Other constipation: Secondary | ICD-10-CM

## 2017-05-30 MED ORDER — METHOCARBAMOL 500 MG PO TABS: 500.0000 mg | ORAL_TABLET | Freq: Four times a day (QID) | ORAL | 1 refills | Status: DC

## 2017-05-30 MED ORDER — POLYETHYLENE GLYCOL 3350 17 G PO PACK: 17.0000 g | PACK | Freq: Every day | ORAL | 1 refills | Status: DC

## 2017-05-30 MED FILL — METHOCARBAMOL 500 MG TABLET: 500 | 15 days supply | Qty: 60 | Fill #0

## 2017-05-30 MED FILL — POLYETHYLENE GLYCOL 3350 PO: 15 days supply | Qty: 255 | Fill #0

## 2017-05-30 NOTE — Patient Instructions (Signed)

## 2017-05-30 NOTE — Progress Notes (Signed)
Subjective:  Patient ID: Alice Woodard, female    DOB: Apr 30, 1971  Age: 46 y.o. MRN: [id number]  CC: Follow-up   HPI [name] is a 46 year old female with a history of hypertension, Achilles tendinitis, dyslipidemia, Bleeding peptic ulcer secondary to H. pylori status post cauterization of bleeding vessels and transfusion due to symptomatic anemia with a hemoglobin of 4.8 in 04/2017.  She has completed her antibiotic therapy for H. pylori gastritis and has been to see GI for follow-up. Recommendation is to continue PPI twice daily 2 months and a repeat H. pylori after that. She has been taking her ferrous sulfate, most recent hemoglobin was 10.0 from 05/03/17  At her last visit hydrochlorothiazide has been discontinued due to hypokalemia was replaced with lisinopril and amlodipine.  She presents today complaining of right-sided abdominal pain worse whenever she lifts and pain is intermittent and described as moderate. She also endorses constipation and having to strain. Denies lower GI bleed for upper GI bleed. Denies dizziness.   Past Medical History:  Diagnosis Date  . Anemia   . History of blood transfusion 04/17/2017   "anemic"  . Hyperlipidemia   . Hypertension     Past Surgical History:  Procedure Laterality Date  . APPENDECTOMY    . CHOLECYSTECTOMY OPEN    . ESOPHAGOGASTRODUODENOSCOPY N/A 04/18/2017   Procedure: ESOPHAGOGASTRODUODENOSCOPY (EGD);  Surgeon: Mauri Pole, MD;  Location: [facility] ENDOSCOPY;  Service: Endoscopy;  Laterality: N/A;    Allergies  Allergen Reactions  . Amlodipine Rash  . Atorvastatin Rash     Outpatient Medications Prior to Visit  Medication Sig Dispense Refill  . amLODipine (NORVASC) 10 MG tablet Take 1 tablet (10 mg total) by mouth daily. 30 tablet 3  . atorvastatin (LIPITOR) 20 MG tablet Take 1 tablet (20 mg total) by mouth daily. 30 tablet 3  . ferrous sulfate 325 (65 FE) MG tablet Take 1 tablet (325 mg total) by mouth daily. 60  tablet 0  . gabapentin (NEURONTIN) 300 MG capsule Take 1 capsule (300 mg total) by mouth 3 (three) times daily. 60 capsule 3  . lisinopril (PRINIVIL,ZESTRIL) 5 MG tablet Take 1 tablet (5 mg total) by mouth daily. 30 tablet 3  . pantoprazole (PROTONIX) 40 MG tablet Take 1 tablet (40 mg total) by mouth 2 (two) times daily. 120 tablet 0  . acetaminophen (TYLENOL) 325 MG tablet Take 2 tablets (650 mg total) by mouth every 6 (six) hours as needed for mild pain (or Fever >/= 101). 60 tablet 0  . amoxicillin (AMOXIL) 500 MG capsule Take 2 capsules (1,000 mg total) by mouth 2 (two) times daily. (Patient not taking: Reported on 05/30/2017) 56 capsule 0  . clarithromycin (BIAXIN) 500 MG tablet Take 1 tablet (500 mg total) by mouth 2 (two) times daily. (Patient not taking: Reported on 05/30/2017) 28 tablet 0  . potassium chloride (K-DUR) 10 MEQ tablet Take 1 tablet (10 mEq total) by mouth daily. 30 tablet 2   No facility-administered medications prior to visit.     ROS Review of Systems  Constitutional: Negative for activity change, appetite change and fatigue.  HENT: Negative for congestion, sinus pressure and sore throat.   Eyes: Negative for visual disturbance.  Respiratory: Negative for cough, chest tightness, shortness of breath and wheezing.   Cardiovascular: Negative for chest pain and palpitations.  Gastrointestinal: Positive for abdominal pain and constipation. Negative for abdominal distention.  Endocrine: Negative for polydipsia.  Genitourinary: Negative for dysuria and frequency.  Musculoskeletal: Negative  for arthralgias and back pain.  Skin: Negative for rash.  Neurological: Negative for tremors, light-headedness and numbness.  Hematological: Does not bruise/bleed easily.  Psychiatric/Behavioral: Negative for agitation and behavioral problems.    Objective:  BP 135/88 (BP Location: Left Arm, Patient Position: Sitting, Cuff Size: Normal)   Pulse (!) 55   Temp 98.5 F (36.9 C) (Oral)    Resp 16   Ht 5' (1.524 m)   Wt 110 lb 12.8 oz (50.3 kg)   LMP 11/15/2014   SpO2 99%   BMI 21.64 kg/m   BP/Weight 05/30/2017 02/28/6221 07/20/9891  Systolic BP 119 92 417  Diastolic BP 88 68 76  Wt. (Lbs) 110.8 112.5 114.4  BMI 21.64 23.51 22.34      Physical Exam  Constitutional: She is oriented to person, place, and time. She appears well-developed and well-nourished.  Cardiovascular: Normal rate, normal heart sounds and intact distal pulses.   No murmur heard. Pulmonary/Chest: Effort normal and breath sounds normal. She has no wheezes. She has no rales. She exhibits no tenderness.  Abdominal: Soft. Bowel sounds are normal. She exhibits no distension and no mass. There is no tenderness.  Musculoskeletal: Normal range of motion.  Neurological: She is alert and oriented to person, place, and time.  Skin: Skin is warm and dry.  Psychiatric: She has a normal mood and affect.     CBC    Component Value Date/Time   WBC 5.3 05/03/2017 0916   WBC 12.9 (H) 04/25/2017 1438   RBC 3.35 (L) 05/03/2017 0916   RBC 2.81 (L) 04/25/2017 1438   HGB 10.0 (L) 05/03/2017 0916   HCT 30.3 (L) 05/03/2017 0916   PLT 490 (H) 05/03/2017 0916   MCV 90 05/03/2017 0916   MCH 29.9 05/03/2017 0916   MCH 29.8 04/18/2017 0407   MCHC 33.0 05/03/2017 0916   MCHC 33.4 04/25/2017 1438   RDW 14.5 05/03/2017 0916   LYMPHSABS 1.5 05/03/2017 0916   MONOABS 0.6 12/29/2015 1126   EOSABS 0.1 05/03/2017 0916   BASOSABS 0.0 05/03/2017 0916    CMP Latest Ref Rng & Units 04/26/2017 04/18/2017 04/17/2017  Glucose 65 - 99 mg/dL 77 91 99  BUN 6 - 24 mg/dL 8 26(H) 30(H)  Creatinine 0.57 - 1.00 mg/dL 0.88 0.71 0.68  Sodium 134 - 144 mmol/L 139 140 140  Potassium 3.5 - 5.2 mmol/L 4.5 3.2(L) 3.4(L)  Chloride 96 - 106 mmol/L 103 111 109  CO2 20 - 29 mmol/L 23 22 24   Calcium 8.7 - 10.2 mg/dL 8.5(L) 8.1(L) 8.2(L)  Total Protein 6.5 - 8.1 g/dL - 4.3(L) 5.0(L)  Total Bilirubin 0.3 - 1.2 mg/dL - 1.2 1.0  Alkaline Phos 38  - 126 U/L - 58 67  AST 15 - 41 U/L - 14(L) 19  ALT 14 - 54 U/L - 9(L) 12(L)     Assessment & Plan:   1. Essential hypertension, benign Controlled Continue antihypertensives  2. Ulcer of the duodenum caused by bacteria (H. pylori) Completed course of antibiotic therapy Continue PPI twice daily 2 months - CBC with Differential/Platelet  3. Hypokalemia - Basic Metabolic Panel  4. Right upper quadrant abdominal pain Could be secondary to constipation versus muscle spasm - methocarbamol (ROBAXIN) 500 MG tablet; Take 1 tablet (500 mg total) by mouth 4 (four) times daily.  Dispense: 60 tablet; Refill: 1  5. Other constipation Increase fiber intake - polyethylene glycol (MIRALAX) packet; Take 17 g by mouth daily.  Dispense: 30 packet; Refill: 1   Meds  ordered this encounter  Medications  . methocarbamol (ROBAXIN) 500 MG tablet    Sig: Take 1 tablet (500 mg total) by mouth 4 (four) times daily.    Dispense:  60 tablet    Refill:  1  . polyethylene glycol (MIRALAX) packet    Sig: Take 17 g by mouth daily.    Dispense:  30 packet    Refill:  1    Follow-up: Return in about 3 months (around 08/30/2017) for follow up of chronic medical conditions.   This note has been created with Surveyor, quantity. Any transcriptional errors are unintentional.     Arnoldo Morale MD

## 2017-05-31 LAB — BASIC METABOLIC PANEL
BUN/Creatinine Ratio: 8 — ABNORMAL LOW (ref 9–23)
BUN: 6 mg/dL (ref 6–24)
CALCIUM: 10.1 mg/dL (ref 8.7–10.2)
CHLORIDE: 105 mmol/L (ref 96–106)
CO2: 25 mmol/L (ref 20–29)
Creatinine, Ser: 0.78 mg/dL (ref 0.57–1.00)
GFR, EST AFRICAN AMERICAN: 104 mL/min/{1.73_m2} (ref >59)
GFR, EST NON AFRICAN AMERICAN: 90 mL/min/{1.73_m2} (ref >59)
Glucose: 82 mg/dL (ref 65–99)
POTASSIUM: 4 mmol/L (ref 3.5–5.2)
Sodium: 147 mmol/L — ABNORMAL HIGH (ref 134–144)

## 2017-05-31 LAB — CBC WITH DIFFERENTIAL/PLATELET
BASOS ABS: 0 10*3/uL (ref 0.0–0.2)
Basos: 0 %
EOS (ABSOLUTE): 0.2 10*3/uL (ref 0.0–0.4)
Eos: 3 %
HEMOGLOBIN: 11.2 g/dL (ref 11.1–15.9)
Hematocrit: 34.6 % (ref 34.0–46.6)
IMMATURE GRANS (ABS): 0 10*3/uL (ref 0.0–0.1)
IMMATURE GRANULOCYTES: 0 %
LYMPHS: 26 %
Lymphocytes Absolute: 1.9 10*3/uL (ref 0.7–3.1)
MCH: 29.3 pg (ref 26.6–33.0)
MCHC: 32.4 g/dL (ref 31.5–35.7)
MCV: 91 fL (ref 79–97)
MONOCYTES: 7 %
Monocytes Absolute: 0.5 10*3/uL (ref 0.1–0.9)
NEUTROS ABS: 4.7 10*3/uL (ref 1.4–7.0)
NEUTROS PCT: 64 %
Platelets: 329 10*3/uL (ref 150–379)
RBC: 3.82 x10E6/uL (ref 3.77–5.28)
RDW: 14 % (ref 12.3–15.4)
WBC: 7.4 10*3/uL (ref 3.4–10.8)

## 2017-06-19 ENCOUNTER — Other Ambulatory Visit: Payer: Self-pay | Admitting: Family Medicine

## 2017-06-19 DIAGNOSIS — R1011 Right upper quadrant pain: Secondary | ICD-10-CM

## 2017-06-19 MED FILL — ?LISINOPRIL 5 MG TABLET: 5 | 30 days supply | Qty: 30 | Fill #2

## 2017-06-19 MED FILL — METHOCARBAMOL 500 MG TABLET: 500 | 15 days supply | Qty: 60 | Fill #1

## 2017-06-19 MED FILL — AMLODIPINE BESYLATE 10 MG T: 10 | 30 days supply | Qty: 30 | Fill #2

## 2017-06-19 MED FILL — ATORVASTATIN 20 MG TABLET: 20 | 30 days supply | Qty: 30 | Fill #2

## 2017-06-20 ENCOUNTER — Other Ambulatory Visit: Payer: Self-pay | Admitting: Physical Medicine & Rehabilitation

## 2017-06-20 MED FILL — POLYETHYLENE GLYCOL 3350 PO: 15 days supply | Qty: 255 | Fill #1

## 2017-06-25 ENCOUNTER — Other Ambulatory Visit: Payer: Self-pay

## 2017-06-25 ENCOUNTER — Ambulatory Visit (INDEPENDENT_AMBULATORY_CARE_PROVIDER_SITE_OTHER): Payer: Self-pay | Admitting: Gastroenterology

## 2017-06-25 ENCOUNTER — Encounter: Payer: Self-pay | Admitting: Gastroenterology

## 2017-06-25 VITALS — BP 108/72 | HR 74 | Ht <= 58 in | Wt 108.4 lb

## 2017-06-25 DIAGNOSIS — R1011 Right upper quadrant pain: Secondary | ICD-10-CM

## 2017-06-25 DIAGNOSIS — K5900 Constipation, unspecified: Secondary | ICD-10-CM

## 2017-06-25 DIAGNOSIS — A048 Other specified bacterial intestinal infections: Secondary | ICD-10-CM

## 2017-06-25 DIAGNOSIS — K279 Peptic ulcer, site unspecified, unspecified as acute or chronic, without hemorrhage or perforation: Secondary | ICD-10-CM

## 2017-06-25 NOTE — Patient Instructions (Signed)
You have been scheduled for an abdominal ultrasound at [facility] (1st floor of hospital) on  06/28/2017  at 11am. Please arrive 15 minutes prior to your appointment for registration. Make certain not to have anything to eat or drink 6 hours prior to your appointment. Should you need to reschedule your appointment, please contact radiology at [phone]. This test typically takes about 30 minutes to perform.  Go to the basement today for your lab kit please return as soon as possible, do not start taking prilosec until after your stool tests are complete

## 2017-06-25 NOTE — Progress Notes (Signed)
Alice Woodard    [id number]    February 21, 1971  Primary Care Physician:Amao, Charlane Ferretti, MD  Referring Physician: Arnoldo Morale, [place], [place] [address]  Chief complaint:  RUQ abd pain  HPI:  46 year old female with probably speaking and accompanied by her daughter is here for follow-up visit. Patient complains of right upper quadrant abdominal pain. She is also having intermittent episodes of heartburn that wakes her up from sleep or has difficulty falling asleep. Worse when she eats heavy meals for dinner especially meat. Denies any dysphagia, nausea or vomiting. No melena or blood per rectum. She is having intermittent constipation thats somewhat improved with MiraLAX. She is currently not on PPI or H2 blocker. She completed antibiotics for 2 weeks for H. pylori therapy .She hasn't done done H pylori stool antigen to document eradication.   Outpatient Encounter Prescriptions as of 06/25/2017  Medication Sig  . acetaminophen (TYLENOL) 325 MG tablet Take 2 tablets (650 mg total) by mouth every 6 (six) hours as needed for mild pain (or Fever >/= 101).  Marland Kitchen amLODipine (NORVASC) 10 MG tablet Take 1 tablet (10 mg total) by mouth daily.  Marland Kitchen atorvastatin (LIPITOR) 20 MG tablet Take 1 tablet (20 mg total) by mouth daily.  . ferrous sulfate 325 (65 FE) MG tablet Take 1 tablet (325 mg total) by mouth daily.  Marland Kitchen gabapentin (NEURONTIN) 300 MG capsule Take 1 capsule (300 mg total) by mouth 3 (three) times daily.  Marland Kitchen lisinopril (PRINIVIL,ZESTRIL) 5 MG tablet Take 1 tablet (5 mg total) by mouth daily.  . methocarbamol (ROBAXIN) 500 MG tablet Take 1 tablet (500 mg total) by mouth 4 (four) times daily.  . pantoprazole (PROTONIX) 40 MG tablet Take 1 tablet (40 mg total) by mouth 2 (two) times daily.  . polyethylene glycol (MIRALAX) packet Take 17 g by mouth daily.  . potassium chloride (K-DUR) 10 MEQ tablet Take 1 tablet (10 mEq total) by mouth daily.  . [DISCONTINUED]  amoxicillin (AMOXIL) 500 MG capsule Take 2 capsules (1,000 mg total) by mouth 2 (two) times daily.  . [DISCONTINUED] clarithromycin (BIAXIN) 500 MG tablet Take 1 tablet (500 mg total) by mouth 2 (two) times daily.   No facility-administered encounter medications on file as of 06/25/2017.     Allergies as of 06/25/2017 - Review Complete 06/25/2017  Allergen Reaction Noted  . Amlodipine Rash 04/17/2017  . Atorvastatin Rash 04/17/2017    Past Medical History:  Diagnosis Date  . Anemia   . History of blood transfusion 04/17/2017   "anemic"  . Hyperlipidemia   . Hypertension     Past Surgical History:  Procedure Laterality Date  . APPENDECTOMY    . CHOLECYSTECTOMY OPEN    . ESOPHAGOGASTRODUODENOSCOPY N/A 04/18/2017   Procedure: ESOPHAGOGASTRODUODENOSCOPY (EGD);  Surgeon: Mauri Pole, MD;  Location: [facility] ENDOSCOPY;  Service: Endoscopy;  Laterality: N/A;    History reviewed. No pertinent family history.  Social History   Social History  . Marital status: Single    Spouse name: N/A  . Number of children: 2  . Years of education: N/A   Occupational History  . Not on file.   Social History Main Topics  . Smoking status: Never Smoker  . Smokeless tobacco: Never Used  . Alcohol use No  . Drug use: No  . Sexual activity: Not on file   Other Topics Concern  . Not on file   Social History Narrative  . No  narrative on file      Review of systems: Review of Systems  Constitutional: Negative for fever and chills.  HENT: Negative.   Eyes: Negative for blurred vision.  Respiratory: Negative for cough, shortness of breath and wheezing.   Cardiovascular: Negative for chest pain and palpitations.  Gastrointestinal: as per HPI Genitourinary: Negative for dysuria, urgency, frequency and hematuria.  Musculoskeletal: Negative for myalgias, back pain and joint pain.  Skin: Negative for itching and rash.  Neurological: Negative for dizziness, tremors, focal weakness,  seizures and loss of consciousness.  Endo/Heme/Allergies: Positive for seasonal allergies.  Psychiatric/Behavioral: Negative for depression, suicidal ideas and hallucinations.  All other systems reviewed and are negative.   Physical Exam: Vitals:   06/25/17 1039  BP: 108/72  Pulse: 74   Body mass index is 22.65 kg/m. Gen:      No acute distress HEENT:  EOMI, sclera anicteric Neck:     No masses; no thyromegaly Lungs:    Clear to auscultation bilaterally; normal respiratory effort CV:         Regular rate and rhythm; no murmurs Abd:      + bowel sounds; soft, non-tender; no palpable masses, no distension Ext:    No edema; adequate peripheral perfusion Skin:      Warm and dry; no rash Neuro: alert and oriented x 3 Psych: normal mood and affect  Data Reviewed:  Reviewed labs, radiology imaging, old records and pertinent past GI work up   Assessment and Plan/Recommendations:  46 year old female with history of multiple duodenal ulcers in the setting of H. pylori infection and chronic NSAID use here for follow-up visit.  H. pylori infection : Status post-therapy  Check H. pylori stool antigen to document eradication  Heartburn/GERD: Start omeprazole 20 mg daily 30 minutes before breakfast and Zantac 150 milligrams at bedtime daily as needed Advised patient to start PPI after she submits the stool sample for H. pylori testing  Constipation: Continue MiraLAX daily Increase dietary fluid and fiber intake  Right upper quadrant abdominal pain: Obtain abdominal ultrasound Based on labs in June 2018, patient had low albumin and low protein otherwise bilirubin, alkaline phosphatase, AST and ALT were normal    K. Denzil Magnuson , MD [phone] Mon-Fri 8a-5p [phone] after 5p, weekends, holidays  CC: Arnoldo Morale, MD

## 2017-06-26 ENCOUNTER — Other Ambulatory Visit: Payer: Self-pay

## 2017-06-26 DIAGNOSIS — K279 Peptic ulcer, site unspecified, unspecified as acute or chronic, without hemorrhage or perforation: Secondary | ICD-10-CM

## 2017-06-26 DIAGNOSIS — R1011 Right upper quadrant pain: Secondary | ICD-10-CM

## 2017-06-26 DIAGNOSIS — A048 Other specified bacterial intestinal infections: Secondary | ICD-10-CM

## 2017-06-27 LAB — HELICOBACTER PYLORI  SPECIAL ANTIGEN: H. PYLORI Antigen: NOT DETECTED

## 2017-06-28 ENCOUNTER — Ambulatory Visit (HOSPITAL_COMMUNITY): Payer: Self-pay

## 2017-06-28 ENCOUNTER — Telehealth: Payer: Self-pay | Admitting: *Deleted

## 2017-06-28 NOTE — Telephone Encounter (Signed)
-----   Message from Arnoldo Morale, MD sent at 06/03/2017  4:58 PM EDT ----- Labs reveal slightly elevated sodium-advised to decrease sodium intake

## 2017-06-28 NOTE — Telephone Encounter (Signed)
Medical Assistant used [employer] to contact patient.  Interpreter Name: Fredirick Maudlin Interpreter #: [id number] Patient was not available, [employer] left patient a voicemail. Voicemail states to give a call back to [place] with [facility] at [phone].

## 2017-07-03 ENCOUNTER — Ambulatory Visit (HOSPITAL_COMMUNITY)
Admission: RE | Admit: 2017-07-03 | Discharge: 2017-07-03 | Disposition: A | Discharge status: Home / Self Care | Payer: Self-pay | Source: Ambulatory Visit | Attending: Gastroenterology | Admitting: Gastroenterology

## 2017-07-03 DIAGNOSIS — K279 Peptic ulcer, site unspecified, unspecified as acute or chronic, without hemorrhage or perforation: Secondary | ICD-10-CM

## 2017-07-03 DIAGNOSIS — A048 Other specified bacterial intestinal infections: Secondary | ICD-10-CM

## 2017-07-03 DIAGNOSIS — R1011 Right upper quadrant pain: Secondary | ICD-10-CM

## 2017-07-05 ENCOUNTER — Other Ambulatory Visit: Payer: Self-pay

## 2017-07-05 DIAGNOSIS — K2990 Gastroduodenitis, unspecified, without bleeding: Principal | ICD-10-CM

## 2017-07-05 DIAGNOSIS — K2970 Gastritis, unspecified, without bleeding: Secondary | ICD-10-CM

## 2017-07-05 MED ORDER — PANTOPRAZOLE SODIUM 40 MG PO TBEC: 40.0000 mg | DELAYED_RELEASE_TABLET | Freq: Two times a day (BID) | ORAL | 0 refills | Status: DC

## 2017-07-05 MED FILL — ?PANTOPRAZOLE SOD DR 40MG: 40 MG | 30 days supply | Qty: 60 | Fill #0

## 2017-07-22 MED FILL — LISINOPRIL 5 MG TAB: 5 | 30 days supply | Qty: 30 | Fill #3

## 2017-07-22 MED FILL — ?ATORVASTATIN 20 MG TABLET: 20 | 30 days supply | Qty: 30 | Fill #3

## 2017-08-06 ENCOUNTER — Other Ambulatory Visit: Payer: Self-pay

## 2017-09-13 ENCOUNTER — Ambulatory Visit: Payer: Self-pay | Attending: Family Medicine

## 2017-10-09 DIAGNOSIS — I10 Essential (primary) hypertension: Secondary | ICD-10-CM

## 2017-10-14 ENCOUNTER — Ambulatory Visit: Payer: Self-pay | Attending: Family Medicine | Admitting: Family Medicine

## 2017-10-14 ENCOUNTER — Encounter: Payer: Self-pay | Admitting: Family Medicine

## 2017-10-14 VITALS — BP 182/100 | HR 58 | Temp 98.2°F | Ht <= 58 in | Wt 112.4 lb

## 2017-10-14 DIAGNOSIS — N951 Menopausal and female climacteric states: Secondary | ICD-10-CM | POA: Insufficient documentation

## 2017-10-14 DIAGNOSIS — Z79899 Other long term (current) drug therapy: Secondary | ICD-10-CM | POA: Insufficient documentation

## 2017-10-14 DIAGNOSIS — I10 Essential (primary) hypertension: Secondary | ICD-10-CM | POA: Insufficient documentation

## 2017-10-14 DIAGNOSIS — Z23 Encounter for immunization: Secondary | ICD-10-CM | POA: Insufficient documentation

## 2017-10-14 DIAGNOSIS — Z90710 Acquired absence of both cervix and uterus: Secondary | ICD-10-CM | POA: Insufficient documentation

## 2017-10-14 DIAGNOSIS — R232 Flushing: Secondary | ICD-10-CM

## 2017-10-14 DIAGNOSIS — E785 Hyperlipidemia, unspecified: Secondary | ICD-10-CM | POA: Insufficient documentation

## 2017-10-14 DIAGNOSIS — M7661 Achilles tendinitis, right leg: Secondary | ICD-10-CM

## 2017-10-14 DIAGNOSIS — M7660 Achilles tendinitis, unspecified leg: Secondary | ICD-10-CM | POA: Insufficient documentation

## 2017-10-14 DIAGNOSIS — Z9889 Other specified postprocedural states: Secondary | ICD-10-CM | POA: Insufficient documentation

## 2017-10-14 DIAGNOSIS — K2970 Gastritis, unspecified, without bleeding: Secondary | ICD-10-CM | POA: Insufficient documentation

## 2017-10-14 DIAGNOSIS — K2990 Gastroduodenitis, unspecified, without bleeding: Secondary | ICD-10-CM | POA: Insufficient documentation

## 2017-10-14 DIAGNOSIS — G5601 Carpal tunnel syndrome, right upper limb: Secondary | ICD-10-CM | POA: Insufficient documentation

## 2017-10-14 MED ORDER — DICLOFENAC SODIUM 1 % TD GEL: 4.0000 g | Freq: Four times a day (QID) | TRANSDERMAL | 1 refills | Status: DC

## 2017-10-14 MED ORDER — ATORVASTATIN CALCIUM 20 MG PO TABS: 20.0000 mg | ORAL_TABLET | Freq: Every day | ORAL | 6 refills | Status: DC

## 2017-10-14 MED ORDER — LISINOPRIL 5 MG PO TABS: 5.0000 mg | ORAL_TABLET | Freq: Every day | ORAL | 6 refills | Status: DC

## 2017-10-14 MED ORDER — PANTOPRAZOLE SODIUM 40 MG PO TBEC: 40.0000 mg | DELAYED_RELEASE_TABLET | Freq: Two times a day (BID) | ORAL | 6 refills | Status: DC

## 2017-10-14 MED ORDER — CLONIDINE HCL 0.1 MG PO TABS: 0.1000 mg | ORAL_TABLET | Freq: Every day | ORAL | 6 refills | Status: DC

## 2017-10-14 MED ORDER — GABAPENTIN 300 MG PO CAPS: 300.0000 mg | ORAL_CAPSULE | Freq: Three times a day (TID) | ORAL | 6 refills | Status: DC

## 2017-10-14 MED ORDER — AMLODIPINE BESYLATE 10 MG PO TABS: 10.0000 mg | ORAL_TABLET | Freq: Every day | ORAL | 6 refills | Status: DC

## 2017-10-14 MED FILL — AMLODIPINE BESYLATE 10 MG T: 10 | 30 days supply | Qty: 30 | Fill #0

## 2017-10-14 MED FILL — LISINOPRIL 5 MG TAB: 5 | 30 days supply | Qty: 30 | Fill #0

## 2017-10-14 MED FILL — GABAPENTIN 300 MG CAPSULE: 300 | 20 days supply | Qty: 60 | Fill #0

## 2017-10-14 MED FILL — ?CLONIDINE HCL 0.1 MG TABL: 0.1 | 30 days supply | Qty: 30 | Fill #0

## 2017-10-14 MED FILL — ?PANTOPRAZOLE SOD DR 40MG: 40 MG | 30 days supply | Qty: 60 | Fill #0

## 2017-10-14 MED FILL — ?ATORVASTATIN 20 MG TABLET: 20 | 30 days supply | Qty: 30 | Fill #0

## 2017-10-14 MED FILL — DICLOFENAC SODIUM 1% GEL: 1 | 6 days supply | Qty: 100 | Fill #0

## 2017-10-14 NOTE — Patient Instructions (Signed)

## 2017-10-14 NOTE — Progress Notes (Signed)
Subjective:  Patient ID: Alice Woodard, female    DOB: 1971-09-04  Age: 46 y.o. MRN: [id number]  CC: Hypertension   HPI Alice Woodard  is a 46 year old female with a history of hypertension, Achilles tendinitis, dyslipidemia, Bleeding peptic ulcer secondary to H. pylori status post cauterization of bleeding vessels who presents today for a follow-up visit.  Her blood pressure is severely elevated and she endorses being out of her antihypertensive for the last 2 months and this has resulted in her having headaches, tinnitus and intermittent epistaxis she says. She denies those symptoms at this time and denies chest pains or shortness of breath.  Her reflux and peptic ulcer been controlled and she denies any recent flares even though she has been out of her PPI.  She complains of pain at the point of insertion of her right Achilles tendon with associated edema.  Denies any recent trauma. She also complains of hot flashes which have been intermittent and she usually has to drink cold water to obtain some relief.  Past Medical History:  Diagnosis Date  . Anemia   . History of blood transfusion 04/17/2017   "anemic"  . Hyperlipidemia   . Hypertension     Past Surgical History:  Procedure Laterality Date  . APPENDECTOMY    . CHOLECYSTECTOMY OPEN    . ESOPHAGOGASTRODUODENOSCOPY N/A 04/18/2017   Procedure: ESOPHAGOGASTRODUODENOSCOPY (EGD);  Surgeon: Mauri Pole, MD;  Location: [facility] ENDOSCOPY;  Service: Endoscopy;  Laterality: N/A;    Allergies  Allergen Reactions  . Amlodipine Rash  . Atorvastatin Rash     Outpatient Medications Prior to Visit  Medication Sig Dispense Refill  . acetaminophen (TYLENOL) 325 MG tablet Take 2 tablets (650 mg total) by mouth every 6 (six) hours as needed for mild pain (or Fever >/= 101). (Patient not taking: Reported on 10/14/2017) 60 tablet 0  . ferrous sulfate 325 (65 FE) MG tablet Take 1 tablet (325 mg total) by mouth daily. (Patient not  taking: Reported on 10/14/2017) 60 tablet 0  . methocarbamol (ROBAXIN) 500 MG tablet Take 1 tablet (500 mg total) by mouth 4 (four) times daily. (Patient not taking: Reported on 10/14/2017) 60 tablet 1  . potassium chloride (K-DUR) 10 MEQ tablet Take 1 tablet (10 mEq total) by mouth daily. (Patient not taking: Reported on 10/14/2017) 30 tablet 2  . amLODipine (NORVASC) 10 MG tablet Take 1 tablet (10 mg total) by mouth daily. (Patient not taking: Reported on 10/14/2017) 30 tablet 3  . atorvastatin (LIPITOR) 20 MG tablet Take 1 tablet (20 mg total) by mouth daily. (Patient not taking: Reported on 10/14/2017) 30 tablet 3  . gabapentin (NEURONTIN) 300 MG capsule Take 1 capsule (300 mg total) by mouth 3 (three) times daily. (Patient not taking: Reported on 10/14/2017) 60 capsule 3  . lisinopril (PRINIVIL,ZESTRIL) 5 MG tablet Take 1 tablet (5 mg total) by mouth daily. (Patient not taking: Reported on 10/14/2017) 30 tablet 3  . pantoprazole (PROTONIX) 40 MG tablet Take 1 tablet (40 mg total) by mouth 2 (two) times daily. (Patient not taking: Reported on 10/14/2017) 60 tablet 0  . polyethylene glycol (MIRALAX) packet Take 17 g by mouth daily. (Patient not taking: Reported on 10/14/2017) 30 packet 1   No facility-administered medications prior to visit.     ROS Review of Systems  Constitutional: Negative for activity change, appetite change and fatigue.  HENT: Positive for tinnitus. Negative for congestion, sinus pressure and sore throat.   Eyes: Negative for visual  disturbance.  Respiratory: Negative for cough, chest tightness, shortness of breath and wheezing.   Cardiovascular: Negative for chest pain and palpitations.  Gastrointestinal: Negative for abdominal distention, abdominal pain and constipation.  Endocrine: Negative for polydipsia.  Genitourinary: Negative for dysuria and frequency.  Musculoskeletal: Negative for arthralgias and back pain.  Skin: Negative for rash.  Neurological: Positive for  headaches. Negative for tremors, light-headedness and numbness.  Hematological: Does not bruise/bleed easily.  Psychiatric/Behavioral: Negative for agitation and behavioral problems.    Objective:  BP (!) 182/100   Pulse (!) 58   Temp 98.2 F (36.8 C) (Oral)   Ht '4\' 10"'  (1.473 m)   Wt 112 lb 6.4 oz (51 kg)   LMP 11/15/2014   SpO2 100%   BMI 23.49 kg/m   BP/Weight 10/14/2017 06/25/2017 1/60/7371  Systolic BP 062 694 854  Diastolic BP 627 72 88  Wt. (Lbs) 112.4 108.38 110.8  BMI 23.49 22.65 21.64      Physical Exam  Constitutional: She is oriented to person, place, and time. She appears well-developed and well-nourished.  Cardiovascular: Normal heart sounds and intact distal pulses. Bradycardia present.  No murmur heard. Pulmonary/Chest: Effort normal and breath sounds normal. She has no wheezes. She has no rales. She exhibits no tenderness.  Abdominal: Soft. Bowel sounds are normal. She exhibits no distension and no mass. There is no tenderness.  Musculoskeletal: Normal range of motion. She exhibits edema (slight edema and TTP on palpation of right posterior calcaneus).  Neurological: She is alert and oriented to person, place, and time.  Skin: Skin is warm and dry.  Psychiatric: She has a normal mood and affect.     CMP Latest Ref Rng & Units 05/30/2017 04/26/2017 04/18/2017  Glucose 65 - 99 mg/dL 82 77 91  BUN 6 - 24 mg/dL 6 8 26(H)  Creatinine 0.57 - 1.00 mg/dL 0.78 0.88 0.71  Sodium 134 - 144 mmol/L 147(H) 139 140  Potassium 3.5 - 5.2 mmol/L 4.0 4.5 3.2(L)  Chloride 96 - 106 mmol/L 105 103 111  CO2 20 - 29 mmol/L '25 23 22  ' Calcium 8.7 - 10.2 mg/dL 10.1 8.5(L) 8.1(L)  Total Protein 6.5 - 8.1 g/dL - - 4.3(L)  Total Bilirubin 0.3 - 1.2 mg/dL - - 1.2  Alkaline Phos 38 - 126 U/L - - 58  AST 15 - 41 U/L - - 14(L)  ALT 14 - 54 U/L - - 9(L)    Lipid Panel     Component Value Date/Time   CHOL 214 (H) 10/26/2016 0933   TRIG 112 10/26/2016 0933   HDL 45 (L) 10/26/2016  0933   CHOLHDL 4.8 10/26/2016 0933   VLDL 22 10/26/2016 0933   LDLCALC 147 (H) 10/26/2016 0933    Assessment & Plan:   1. Essential hypertension, benign Severely uncontrolled due to running out of medications Unable to give clonidine due to bradycardia Refilled medications and I have spoken with the pharmacist who will ensure she receives them today. Low sodium, DASH diet - amLODipine (NORVASC) 10 MG tablet; Take 1 tablet (10 mg total) by mouth daily.  Dispense: 30 tablet; Refill: 6 - lisinopril (PRINIVIL,ZESTRIL) 5 MG tablet; Take 1 tablet (5 mg total) by mouth daily.  Dispense: 30 tablet; Refill: 6 - CMP14+EGFR - Lipid panel  2. Dyslipidemia Uncontrolled Lipid panel today Low-cholesterol diet - atorvastatin (LIPITOR) 20 MG tablet; Take 1 tablet (20 mg total) by mouth daily.  Dispense: 30 tablet; Refill: 6  3. Carpal tunnel syndrome of right wrist  Stable - gabapentin (NEURONTIN) 300 MG capsule; Take 1 capsule (300 mg total) by mouth 3 (three) times daily.  Dispense: 60 capsule; Refill: 6  4. Gastritis and gastroduodenitis Controlled - pantoprazole (PROTONIX) 40 MG tablet; Take 1 tablet (40 mg total) by mouth 2 (two) times daily.  Dispense: 60 tablet; Refill: 6  5. Achilles tendinitis of right lower extremity Hold off on oral NSAIDs due to history of bleeding peptic ulcers - diclofenac sodium (VOLTAREN) 1 % GEL; Apply 4 g topically 4 (four) times daily.  Dispense: 100 g; Refill: 1  6. Need for influenza vaccination - Flu Vaccine QUAD 36+ mos IM  7. Hot flashes - cloNIDine (CATAPRES) 0.1 MG tablet; Take 1 tablet (0.1 mg total) by mouth at bedtime. For hotflashes  Dispense: 30 tablet; Refill: 6   Meds ordered this encounter  Medications  . amLODipine (NORVASC) 10 MG tablet    Sig: Take 1 tablet (10 mg total) by mouth daily.    Dispense:  30 tablet    Refill:  6  . atorvastatin (LIPITOR) 20 MG tablet    Sig: Take 1 tablet (20 mg total) by mouth daily.    Dispense:  30  tablet    Refill:  6  . gabapentin (NEURONTIN) 300 MG capsule    Sig: Take 1 capsule (300 mg total) by mouth 3 (three) times daily.    Dispense:  60 capsule    Refill:  6    Discontinue previous dose  . lisinopril (PRINIVIL,ZESTRIL) 5 MG tablet    Sig: Take 1 tablet (5 mg total) by mouth daily.    Dispense:  30 tablet    Refill:  6    Discontinue hydrochlorothiazide  . pantoprazole (PROTONIX) 40 MG tablet    Sig: Take 1 tablet (40 mg total) by mouth 2 (two) times daily.    Dispense:  60 tablet    Refill:  6  . cloNIDine (CATAPRES) 0.1 MG tablet    Sig: Take 1 tablet (0.1 mg total) by mouth at bedtime. For hotflashes    Dispense:  30 tablet    Refill:  6  . diclofenac sodium (VOLTAREN) 1 % GEL    Sig: Apply 4 g topically 4 (four) times daily.    Dispense:  100 g    Refill:  1    Follow-up: Return in about 1 month (around 11/14/2017) for PAP smear.   Arnoldo Morale MD

## 2017-10-14 NOTE — Progress Notes (Signed)
Pt has not had BP medication for 2 months.

## 2017-10-15 ENCOUNTER — Other Ambulatory Visit: Payer: Self-pay | Admitting: Family Medicine

## 2017-10-15 ENCOUNTER — Telehealth: Payer: Self-pay

## 2017-10-15 DIAGNOSIS — R748 Abnormal levels of other serum enzymes: Secondary | ICD-10-CM

## 2017-10-15 LAB — CMP14+EGFR
ALT: 7 IU/L (ref 0–32)
AST: 18 IU/L (ref 0–40)
Albumin/Globulin Ratio: 1.4 (ref 1.2–2.2)
Albumin: 4.6 g/dL (ref 3.5–5.5)
Alkaline Phosphatase: 181 IU/L — ABNORMAL HIGH (ref 39–117)
BUN/Creatinine Ratio: 9 (ref 9–23)
BUN: 7 mg/dL (ref 6–24)
Bilirubin Total: 0.7 mg/dL (ref 0.0–1.2)
CALCIUM: 9.7 mg/dL (ref 8.7–10.2)
CO2: 23 mmol/L (ref 20–29)
CREATININE: 0.78 mg/dL (ref 0.57–1.00)
Chloride: 106 mmol/L (ref 96–106)
GFR calc Af Amer: 104 mL/min/{1.73_m2} (ref >59)
GFR, EST NON AFRICAN AMERICAN: 90 mL/min/{1.73_m2} (ref >59)
Globulin, Total: 3.2 g/dL (ref 1.5–4.5)
Glucose: 75 mg/dL (ref 65–99)
Potassium: 4.2 mmol/L (ref 3.5–5.2)
Sodium: 144 mmol/L (ref 134–144)
Total Protein: 7.8 g/dL (ref 6.0–8.5)

## 2017-10-15 LAB — LIPID PANEL
CHOL/HDL RATIO: 5.5 ratio — AB (ref 0.0–4.4)
Cholesterol, Total: 225 mg/dL — ABNORMAL HIGH (ref 100–199)
HDL: 41 mg/dL (ref >39)
LDL CALC: 136 mg/dL — AB (ref 0–99)
TRIGLYCERIDES: 239 mg/dL — AB (ref 0–149)
VLDL Cholesterol Cal: 48 mg/dL — ABNORMAL HIGH (ref 5–40)

## 2017-10-15 NOTE — Telephone Encounter (Signed)
Pt was called via interpreter and informed of lab results. Results were given to daughter(hema)

## 2017-11-13 MED FILL — ?CLONIDINE HCL 0.1 MG TABL: 0.1 | 30 days supply | Qty: 30 | Fill #1

## 2017-11-13 MED FILL — ?ATORVASTATIN 20 MG TABLET: 20 | 30 days supply | Qty: 30 | Fill #1

## 2017-11-13 MED FILL — GABAPENTIN 300 MG CAPSULE: 300 | 20 days supply | Qty: 60 | Fill #1

## 2017-11-13 MED FILL — AMLODIPINE BESYLATE 10 MG T: 10 | 30 days supply | Qty: 30 | Fill #1

## 2017-11-13 MED FILL — LISINOPRIL 5 MG TAB: 5 | 30 days supply | Qty: 30 | Fill #1

## 2017-11-29 ENCOUNTER — Ambulatory Visit: Payer: Self-pay | Attending: Family Medicine | Admitting: Family Medicine

## 2017-11-29 ENCOUNTER — Encounter: Payer: Self-pay | Admitting: Family Medicine

## 2017-11-29 VITALS — BP 132/72 | HR 58 | Temp 98.1°F | Ht <= 58 in | Wt 116.0 lb

## 2017-11-29 DIAGNOSIS — Z1239 Encounter for other screening for malignant neoplasm of breast: Secondary | ICD-10-CM

## 2017-11-29 DIAGNOSIS — Z79899 Other long term (current) drug therapy: Secondary | ICD-10-CM | POA: Insufficient documentation

## 2017-11-29 DIAGNOSIS — Z9049 Acquired absence of other specified parts of digestive tract: Secondary | ICD-10-CM | POA: Insufficient documentation

## 2017-11-29 DIAGNOSIS — R0982 Postnasal drip: Secondary | ICD-10-CM | POA: Insufficient documentation

## 2017-11-29 DIAGNOSIS — Z9889 Other specified postprocedural states: Secondary | ICD-10-CM | POA: Insufficient documentation

## 2017-11-29 DIAGNOSIS — Z0000 Encounter for general adult medical examination without abnormal findings: Secondary | ICD-10-CM

## 2017-11-29 DIAGNOSIS — Z124 Encounter for screening for malignant neoplasm of cervix: Secondary | ICD-10-CM

## 2017-11-29 DIAGNOSIS — Z1231 Encounter for screening mammogram for malignant neoplasm of breast: Secondary | ICD-10-CM

## 2017-11-29 DIAGNOSIS — Z01419 Encounter for gynecological examination (general) (routine) without abnormal findings: Secondary | ICD-10-CM | POA: Insufficient documentation

## 2017-11-29 MED ORDER — CETIRIZINE HCL 10 MG PO TABS: 10.0000 mg | ORAL_TABLET | Freq: Every day | ORAL | 1 refills | Status: DC

## 2017-11-29 MED FILL — ?CETIRIZINE HCL 10 MG TABLE: 10 | 30 days supply | Qty: 30 | Fill #0

## 2017-11-29 NOTE — Patient Instructions (Signed)

## 2017-11-29 NOTE — Progress Notes (Signed)
Subjective:  Patient ID: Alice Woodard, female    DOB: [birth year]  Age: 47 y.o. MRN: [id number]  CC: Gynecologic Exam   HPI Alice Woodard presents for complete physical exam. She complains of cough which is worse at night and associated postnasal drip but denies sinus pressure, headaches, fevers or arthralgias. States she sometimes has sharp pains in the glands of her neck   Past Medical History:  Diagnosis Date  . Anemia   . History of blood transfusion 04/17/2017   "anemic"  . Hyperlipidemia   . Hypertension     Past Surgical History:  Procedure Laterality Date  . APPENDECTOMY    . CHOLECYSTECTOMY OPEN    . ESOPHAGOGASTRODUODENOSCOPY N/A 04/18/2017   Procedure: ESOPHAGOGASTRODUODENOSCOPY (EGD);  Surgeon: Mauri Pole, MD;  Location: [facility] ENDOSCOPY;  Service: Endoscopy;  Laterality: N/A;    Allergies  Allergen Reactions  . Amlodipine Rash  . Atorvastatin Rash      Outpatient Medications Prior to Visit  Medication Sig Dispense Refill  . amLODipine (NORVASC) 10 MG tablet Take 1 tablet (10 mg total) by mouth daily. 30 tablet 6  . atorvastatin (LIPITOR) 20 MG tablet Take 1 tablet (20 mg total) by mouth daily. 30 tablet 6  . cloNIDine (CATAPRES) 0.1 MG tablet Take 1 tablet (0.1 mg total) by mouth at bedtime. For hotflashes 30 tablet 6  . diclofenac sodium (VOLTAREN) 1 % GEL Apply 4 g topically 4 (four) times daily. 100 g 1  . gabapentin (NEURONTIN) 300 MG capsule Take 1 capsule (300 mg total) by mouth 3 (three) times daily. 60 capsule 6  . lisinopril (PRINIVIL,ZESTRIL) 5 MG tablet Take 1 tablet (5 mg total) by mouth daily. 30 tablet 6  . pantoprazole (PROTONIX) 40 MG tablet Take 1 tablet (40 mg total) by mouth 2 (two) times daily. 60 tablet 6  . acetaminophen (TYLENOL) 325 MG tablet Take 2 tablets (650 mg total) by mouth every 6 (six) hours as needed for mild pain (or Fever >/= 101). (Patient not taking: Reported on 10/14/2017) 60 tablet 0  . ferrous sulfate 325 (65  FE) MG tablet Take 1 tablet (325 mg total) by mouth daily. (Patient not taking: Reported on 10/14/2017) 60 tablet 0  . methocarbamol (ROBAXIN) 500 MG tablet Take 1 tablet (500 mg total) by mouth 4 (four) times daily. (Patient not taking: Reported on 10/14/2017) 60 tablet 1  . potassium chloride (K-DUR) 10 MEQ tablet Take 1 tablet (10 mEq total) by mouth daily. (Patient not taking: Reported on 10/14/2017) 30 tablet 2   No facility-administered medications prior to visit.     ROS Review of Systems  Constitutional: Negative for activity change, appetite change and fatigue.  HENT: Negative for congestion, sinus pressure and sore throat.   Eyes: Negative for visual disturbance.  Respiratory: Positive for cough. Negative for chest tightness, shortness of breath and wheezing.   Cardiovascular: Negative for chest pain and palpitations.  Gastrointestinal: Negative for abdominal distention, abdominal pain and constipation.  Endocrine: Negative for polydipsia.  Genitourinary: Negative for dysuria and frequency.  Musculoskeletal: Negative for arthralgias and back pain.  Skin: Negative for rash.  Neurological: Negative for tremors, light-headedness and numbness.  Hematological: Does not bruise/bleed easily.  Psychiatric/Behavioral: Negative for agitation and behavioral problems.    Objective:  BP 132/72   Pulse (!) 58   Temp 98.1 F (36.7 C) (Oral)   Ht 4\' 10"  (1.473 m)   Wt 116 lb (52.6 kg)   LMP 11/15/2014   SpO2 100%  BMI 24.24 kg/m   BP/Weight 11/29/2017 10/14/2017 02/18/8118  Systolic BP 147 829 562  Diastolic BP 72 130 72  Wt. (Lbs) 116 112.4 108.38  BMI 24.24 23.49 22.65      Physical Exam  Constitutional: She is oriented to person, place, and time. She appears well-developed and well-nourished. No distress.  HENT:  Head: Normocephalic.  Right Ear: External ear normal.  Left Ear: External ear normal.  Nose: Nose normal.  Mouth/Throat: Oropharynx is clear and moist.  Eyes:  Conjunctivae and EOM are normal. Pupils are equal, round, and reactive to light.  Neck: Normal range of motion. No JVD present.  Cardiovascular: Normal rate, regular rhythm, normal heart sounds and intact distal pulses. Exam reveals no gallop.  No murmur heard. Pulmonary/Chest: Effort normal and breath sounds normal. No respiratory distress. She has no wheezes. She has no rales. She exhibits no tenderness. Right breast exhibits no mass and no tenderness. Left breast exhibits no mass and no tenderness.  Abdominal: Soft. Bowel sounds are normal. She exhibits no distension and no mass. There is no tenderness.  Genitourinary:  Genitourinary Comments: External genitalia, vagina, cervix, adnexa-all normal  Musculoskeletal: Normal range of motion. She exhibits no edema or tenderness.  Neurological: She is alert and oriented to person, place, and time. She has normal reflexes.  Skin: Skin is warm and dry. She is not diaphoretic.  Psychiatric: She has a normal mood and affect.     Assessment & Plan:   1. Annual physical exam Counseled on 150 minutes of exercise per week, healthy eating (including decreased daily intake of saturated fats, cholesterol, added sugars, sodium), STI prevention, routine healthcare maintenance.   2. Screening for breast cancer - MM Digital Screening; Future  3. Screening for cervical cancer - Cytology - PAP([facility])  4. Post-nasal drip - cetirizine (ZYRTEC) 10 MG tablet; Take 1 tablet (10 mg total) by mouth daily.  Dispense: 30 tablet; Refill: 1   Meds ordered this encounter  Medications  . cetirizine (ZYRTEC) 10 MG tablet    Sig: Take 1 tablet (10 mg total) by mouth daily.    Dispense:  30 tablet    Refill:  1    Follow-up: Return in about 3 months (around 02/27/2018) for follow up of chronic medical conditions.   Arnoldo Morale MD

## 2017-12-04 LAB — CYTOLOGY - PAP
DIAGNOSIS: NEGATIVE
HPV: NOT DETECTED

## 2017-12-07 ENCOUNTER — Encounter (HOSPITAL_COMMUNITY): Payer: Self-pay | Admitting: Emergency Medicine

## 2017-12-07 ENCOUNTER — Emergency Department (HOSPITAL_COMMUNITY): Payer: Self-pay

## 2017-12-07 ENCOUNTER — Emergency Department (HOSPITAL_COMMUNITY)
Admission: EM | Admit: 2017-12-07 | Discharge: 2017-12-07 | Disposition: A | Discharge status: Home / Self Care | Payer: Self-pay | Attending: Emergency Medicine | Admitting: Emergency Medicine

## 2017-12-07 DIAGNOSIS — I10 Essential (primary) hypertension: Secondary | ICD-10-CM | POA: Insufficient documentation

## 2017-12-07 DIAGNOSIS — R1084 Generalized abdominal pain: Secondary | ICD-10-CM | POA: Insufficient documentation

## 2017-12-07 DIAGNOSIS — K5900 Constipation, unspecified: Secondary | ICD-10-CM

## 2017-12-07 DIAGNOSIS — Z79899 Other long term (current) drug therapy: Secondary | ICD-10-CM | POA: Insufficient documentation

## 2017-12-07 LAB — URINALYSIS, ROUTINE W REFLEX MICROSCOPIC
BILIRUBIN URINE: NEGATIVE
Glucose, UA: NEGATIVE mg/dL
Hgb urine dipstick: NEGATIVE
KETONES UR: NEGATIVE mg/dL
LEUKOCYTES UA: NEGATIVE
NITRITE: NEGATIVE
PH: 8 (ref 5.0–8.0)
Protein, ur: NEGATIVE mg/dL
SPECIFIC GRAVITY, URINE: 1.001 — AB (ref 1.005–1.030)

## 2017-12-07 LAB — CBC
HEMATOCRIT: 39.9 % (ref 36.0–46.0)
HEMOGLOBIN: 13.9 g/dL (ref 12.0–15.0)
MCH: 29.9 pg (ref 26.0–34.0)
MCHC: 34.8 g/dL (ref 30.0–36.0)
MCV: 85.8 fL (ref 78.0–100.0)
Platelets: 289 10*3/uL (ref 150–400)
RBC: 4.65 MIL/uL (ref 3.87–5.11)
RDW: 13.5 % (ref 11.5–15.5)
WBC: 8 10*3/uL (ref 4.0–10.5)

## 2017-12-07 LAB — LIPASE, BLOOD: Lipase: 51 U/L (ref 11–51)

## 2017-12-07 LAB — COMPREHENSIVE METABOLIC PANEL
ALK PHOS: 152 U/L — AB (ref 38–126)
ALT: 22 U/L (ref 14–54)
ANION GAP: 10 (ref 5–15)
AST: 43 U/L — ABNORMAL HIGH (ref 15–41)
Albumin: 4.2 g/dL (ref 3.5–5.0)
BILIRUBIN TOTAL: 1.2 mg/dL (ref 0.3–1.2)
BUN: 6 mg/dL (ref 6–20)
CALCIUM: 9.4 mg/dL (ref 8.9–10.3)
CO2: 25 mmol/L (ref 22–32)
Chloride: 105 mmol/L (ref 101–111)
Creatinine, Ser: 0.71 mg/dL (ref 0.44–1.00)
GFR calc Af Amer: >60 mL/min (ref >60)
Glucose, Bld: 125 mg/dL — ABNORMAL HIGH (ref 65–99)
POTASSIUM: 3.5 mmol/L (ref 3.5–5.1)
Sodium: 140 mmol/L (ref 135–145)
TOTAL PROTEIN: 7.8 g/dL (ref 6.5–8.1)

## 2017-12-07 LAB — I-STAT BETA HCG BLOOD, ED (MC, WL, AP ONLY)

## 2017-12-07 MED ORDER — POLYETHYLENE GLYCOL 3350 17 G PO PACK: 17.0000 g | PACK | Freq: Every day | ORAL | 0 refills | Status: DC

## 2017-12-07 MED ORDER — IOPAMIDOL (ISOVUE-300) INJECTION 61%
INTRAVENOUS | Status: AC
  Administered 2017-12-07: 100 mL via INTRAVENOUS
  Filled 2017-12-07: qty 100

## 2017-12-07 NOTE — ED Notes (Signed)
Pt's primary language is [place] Daughter present and interpreting but interpreter requested with doctor's presence

## 2017-12-07 NOTE — Discharge Instructions (Signed)
Return to the ED with any concerns including vomiting and not able to keep down liquids or your medications, abdominal pain especially if it localizes to the right lower abdomen, fever or chills, and decreased urine output, decreased level of alertness or lethargy, or any other alarming symptoms.  °

## 2017-12-07 NOTE — ED Notes (Signed)
iPad interpreter placed bedside

## 2017-12-07 NOTE — ED Notes (Signed)
Patient transported to X-ray 

## 2017-12-07 NOTE — ED Triage Notes (Signed)
Pt presents to ED for assessment of constipation x 3 weeks, with small movements, hard and pebbly.  Hx of abdominal surgery on kidney stone in [place], denies hx of bowel issues.  No OTC meds tried.  C/o generalized abdominal pain, no tenderness with palpation, bowel sounds present in all quadrants.

## 2017-12-07 NOTE — ED Provider Notes (Signed)
[facility] Provider Note   CSN: [id number] Arrival date & time: 12/07/17  1131     History   Chief Complaint Chief Complaint  Patient presents with  . Constipation    HPI Alice Woodard is a 47 y.o. female.  HPI  A LEVEL 5 CAVEAT PERTAINS DUE TO [place] LANGUAGE Patient presenting with complaint of constipation.  History obtained by family member at bedside as there was technical difficulty obtaining an [place] interpreter.  She has not had anything except a small amount of stool for the past 2 weeks.  She points to her upper abdomen when complaining of pain.  She has not had any change in her diet.  She has not had any recent medication changes.  She does not take any narcotic pain medication.  She has had no vomiting or recent surgeries.  No fever.  She has been drinking liquids well.  Past Medical History:  Diagnosis Date  . Anemia   . History of blood transfusion 04/17/2017   "anemic"  . Hyperlipidemia   . Hypertension     Patient Active Problem List   Diagnosis Date Noted  . Ulcer of the duodenum caused by bacteria (H. pylori) 04/19/2017  . Gastrointestinal hemorrhage associated with duodenal ulcer   . Upper GI bleed   . Symptomatic anemia 04/17/2017  . Carpal tunnel syndrome 01/25/2017  . Gastritis and gastroduodenitis 05/04/2016  . Achilles tendinitis of right lower extremity 05/04/2016  . Healthcare maintenance 12/29/2015  . SOB (shortness of breath) 12/29/2015  . Blurry vision, bilateral 11/18/2014  . Dyslipidemia 05/24/2014  . Essential hypertension, benign 05/24/2014    Past Surgical History:  Procedure Laterality Date  . APPENDECTOMY    . CHOLECYSTECTOMY OPEN    . ESOPHAGOGASTRODUODENOSCOPY N/A 04/18/2017   Procedure: ESOPHAGOGASTRODUODENOSCOPY (EGD);  Surgeon: Mauri Pole, MD;  Location: [facility] ENDOSCOPY;  Service: Endoscopy;  Laterality: N/A;    OB History    No data available       Home Medications      Prior to Admission medications   Medication Sig Start Date End Date Taking? Authorizing Provider  acetaminophen (TYLENOL) 325 MG tablet Take 2 tablets (650 mg total) by mouth every 6 (six) hours as needed for mild pain (or Fever >/= 101). Patient not taking: Reported on 10/14/2017 04/18/17   Jule Ser, DO  amLODipine (NORVASC) 10 MG tablet Take 1 tablet (10 mg total) by mouth daily. 10/14/17   Charlott Rakes, MD  atorvastatin (LIPITOR) 20 MG tablet Take 1 tablet (20 mg total) by mouth daily. 10/14/17   Charlott Rakes, MD  cetirizine (ZYRTEC) 10 MG tablet Take 1 tablet (10 mg total) by mouth daily. 11/29/17   Charlott Rakes, MD  cloNIDine (CATAPRES) 0.1 MG tablet Take 1 tablet (0.1 mg total) by mouth at bedtime. For hotflashes 10/14/17   Charlott Rakes, MD  diclofenac sodium (VOLTAREN) 1 % GEL Apply 4 g topically 4 (four) times daily. 10/14/17   Charlott Rakes, MD  ferrous sulfate 325 (65 FE) MG tablet Take 1 tablet (325 mg total) by mouth daily. Patient not taking: Reported on 10/14/2017 04/19/17 04/19/18  Charlott Rakes, MD  gabapentin (NEURONTIN) 300 MG capsule Take 1 capsule (300 mg total) by mouth 3 (three) times daily. 10/14/17   Charlott Rakes, MD  lisinopril (PRINIVIL,ZESTRIL) 5 MG tablet Take 1 tablet (5 mg total) by mouth daily. 10/14/17   Charlott Rakes, MD  methocarbamol (ROBAXIN) 500 MG tablet Take 1 tablet (500 mg total) by  mouth 4 (four) times daily. Patient not taking: Reported on 10/14/2017 05/30/17   Charlott Rakes, MD  pantoprazole (PROTONIX) 40 MG tablet Take 1 tablet (40 mg total) by mouth 2 (two) times daily. 10/14/17   Charlott Rakes, MD  polyethylene glycol (MIRALAX) packet Take 17 g by mouth daily. 12/07/17   Nyair Depaulo, Forbes Cellar, MD  potassium chloride (K-DUR) 10 MEQ tablet Take 1 tablet (10 mEq total) by mouth daily. Patient not taking: Reported on 10/14/2017 10/26/16   Charlott Rakes, MD    Family History History reviewed. No pertinent family history.  Social  History Social History   Tobacco Use  . Smoking status: Never Smoker  . Smokeless tobacco: Never Used  Substance Use Topics  . Alcohol use: No  . Drug use: No     Allergies   Amlodipine and Atorvastatin   Review of Systems Review of Systems  ROS reviewed and all otherwise negative except for mentioned in HPI   Physical Exam Updated Vital Signs BP 119/84   Pulse 70   Temp 98 F (36.7 C) (Oral)   Resp 18   LMP 11/15/2014   SpO2 99%  Vitals reviewed Physical Exam  Physical Examination: General appearance - alert, well appearing, and in no distress Mental status - alert, oriented to person, place, and time Eyes -no conjunctival injection, no scleral icterus Mouth - mucous membranes moist, pharynx normal without lesions Neck - supple, no significant adenopathy Chest - clear to auscultation, no wheezes, rales or rhonchi, symmetric air entry Heart - normal rate, regular rhythm, normal S1, S2, no murmurs, rubs, clicks or gallops Abdomen - soft, mild periumblicial tenderness to palpation, nondistended, no masses or organomegaly Neurological - alert, oriented, normal speech, Extremities - peripheral pulses normal, no pedal edema, no clubbing or cyanosis Skin - normal coloration and turgor, no rashes,    ED Treatments / Results  Labs (all labs ordered are listed, but only abnormal results are displayed) Labs Reviewed  COMPREHENSIVE METABOLIC PANEL - Abnormal; Notable for the following components:      Result Value   Glucose, Bld 125 (*)    AST 43 (*)    Alkaline Phosphatase 152 (*)    All other components within normal limits  URINALYSIS, ROUTINE W REFLEX MICROSCOPIC - Abnormal; Notable for the following components:   Color, Urine COLORLESS (*)    Specific Gravity, Urine 1.001 (*)    All other components within normal limits  LIPASE, BLOOD  CBC  I-STAT BETA HCG BLOOD, ED ([facility], [facility], [facility] ONLY)    EKG  EKG Interpretation None       Radiology Dg Abdomen 1  View  Result Date: 12/07/2017 CLINICAL DATA:  Constipation and umbilical pain for 2 weeks, history of gastric ulcers, hypertension EXAM: ABDOMEN - 1 VIEW COMPARISON:  None FINDINGS: Mild gaseous distention of stomach. Visualized rugal folds normal thickness. Normal bowel gas pattern with normal retained stool burden. No bowel dilatation or bowel wall thickening. Bones unremarkable. No urinary tract calcification. IMPRESSION: Nonobstructive bowel gas pattern. Mild gaseous distention of stomach. Electronically Signed   By: Lavonia Dana M.D.   On: 12/07/2017 13:18   Ct Abdomen Pelvis W Contrast  Result Date: 12/07/2017 CLINICAL DATA:  Abdominal distention.  Constipation for 3 weeks. EXAM: CT ABDOMEN AND PELVIS WITH CONTRAST TECHNIQUE: Multidetector CT imaging of the abdomen and pelvis was performed using the standard protocol following bolus administration of intravenous contrast. CONTRAST:  166mL ISOVUE-300 IOPAMIDOL (ISOVUE-300) INJECTION 61% COMPARISON:  None. FINDINGS: Lower chest:  No contributory findings. Hepatobiliary: Hyperperfusion of the ventral left liver which is atrophic with volume loss. This is separate from the quadrate lobe. Cholecystectomy. No common bile duct dilatation.No evidence of biliary obstruction or stone. Pancreas: Unremarkable. Spleen: Unremarkable. Adrenals/Urinary Tract: Negative adrenals. No hydronephrosis or stone. Unremarkable bladder. Stomach/Bowel: No appendicitis. Confluent stool throughout the proximal colon. No rectal impaction or obstruction. Vascular/Lymphatic: No acute vascular abnormality. No mass or adenopathy. Reproductive:No pathologic findings. Other: No ascites or pneumoperitoneum. Musculoskeletal: No acute or aggressive finding. IMPRESSION: 1. No acute finding. 2. History of abdominal distention and constipation. Stool volume is moderate. No rectal impaction. 3. Altered perfusion and atrophy in the left lobe liver best attributed to old vascular insult.  Electronically Signed   By: Monte Fantasia M.D.   On: 12/07/2017 15:42    Procedures Procedures (including critical care time)  Medications Ordered in ED Medications  iopamidol (ISOVUE-300) 61 % injection (100 mLs Intravenous Contrast Given 12/07/17 1451)     Initial Impression / Assessment and Plan / ED Course  I have reviewed the triage vital signs and the nursing notes.  Pertinent labs & imaging results that were available during my care of the patient were reviewed by me and considered in my medical decision making (see chart for details).     Patient complaining of mid abdominal pain as well as constipation.  Only small hard stools for the past 2 weeks.  Labs and CT and x-ray are all reassuring.  Patient has not tried any over-the-counter medications so will give prescription for MiraLAX at this time.  Also given information of the importance of outpatient follow-up.  Multiple attempts to use [place] interpreter were made by myself and nursing staff and we were not able to obtain an interpreter.  Results and plan were discussed with family member at the bedside who verbalized understanding.  Discharged with strict return precautions.  Pt agreeable with plan.  Final Clinical Impressions(s) / ED Diagnoses   Final diagnoses:  Generalized abdominal pain  Constipation, unspecified constipation type    ED Discharge Orders        Ordered    polyethylene glycol (MIRALAX) packet  Daily     12/07/17 1612       Pixie Casino, MD 12/08/17 [phone]

## 2017-12-09 ENCOUNTER — Telehealth: Payer: Self-pay

## 2017-12-09 NOTE — Telephone Encounter (Signed)
Pt was called via interpretor and patient hung up phone.

## 2017-12-17 MED FILL — ?PANTOPRAZOLE SOD DR 40MG: 40 MG | 30 days supply | Qty: 60 | Fill #1

## 2017-12-17 MED FILL — ?ATORVASTATIN 20 MG TABLET: 20 | 30 days supply | Qty: 30 | Fill #2

## 2017-12-17 MED FILL — ?CLONIDINE HCL 0.1 MG TABL: 0.1 | 30 days supply | Qty: 30 | Fill #2

## 2017-12-17 MED FILL — LISINOPRIL 5 MG TABLET: 5 | 30 days supply | Qty: 30 | Fill #2

## 2017-12-17 MED FILL — GABAPENTIN 300 MG CAPSULE: 300 | 20 days supply | Qty: 60 | Fill #2

## 2017-12-17 MED FILL — AMLODIPINE BESYLATE 10 MG T: 10 | 30 days supply | Qty: 30 | Fill #2

## 2017-12-26 ENCOUNTER — Other Ambulatory Visit: Payer: Self-pay | Admitting: Family Medicine

## 2017-12-26 DIAGNOSIS — Z1231 Encounter for screening mammogram for malignant neoplasm of breast: Secondary | ICD-10-CM

## 2018-01-09 ENCOUNTER — Other Ambulatory Visit: Payer: Self-pay | Admitting: Family Medicine

## 2018-01-09 DIAGNOSIS — Z1231 Encounter for screening mammogram for malignant neoplasm of breast: Secondary | ICD-10-CM

## 2018-01-13 MED FILL — LISINOPRIL 5 MG TABLET: 5 | 30 days supply | Qty: 30 | Fill #3

## 2018-01-13 MED FILL — AMLODIPINE BESYLATE 10 MG T: 10 | 30 days supply | Qty: 30 | Fill #3

## 2018-01-13 MED FILL — ATORVASTATIN 20 MG TABLET: 20 | 30 days supply | Qty: 30 | Fill #3

## 2018-01-13 MED FILL — cloNIDine HCL 0.1 MG TABS: 0.1 | 30 days supply | Qty: 30 | Fill #3

## 2018-02-04 MED FILL — GABAPENTIN 300 MG CAPSULE: 300 | 20 days supply | Qty: 60 | Fill #3

## 2018-02-18 MED FILL — PANTOPRAZOLE SOD DR 40 MG T: 40 | 30 days supply | Qty: 60 | Fill #2

## 2018-02-18 MED FILL — ATORVASTATIN 20 MG TABLET: 20 | 30 days supply | Qty: 30 | Fill #4

## 2018-02-18 MED FILL — AMLODIPINE BESYLATE 10 MG T: 10 | 30 days supply | Qty: 30 | Fill #4

## 2018-02-27 MED FILL — GABAPENTIN 300 MG CAPSULE: 300 | 20 days supply | Qty: 60 | Fill #4

## 2018-03-07 ENCOUNTER — Encounter: Payer: Self-pay | Admitting: Family Medicine

## 2018-03-07 ENCOUNTER — Ambulatory Visit: Payer: Self-pay | Attending: Family Medicine | Admitting: Family Medicine

## 2018-03-07 VITALS — BP 143/81 | HR 65 | Temp 98.0°F | Ht <= 58 in | Wt 121.8 lb

## 2018-03-07 DIAGNOSIS — G5601 Carpal tunnel syndrome, right upper limb: Secondary | ICD-10-CM | POA: Insufficient documentation

## 2018-03-07 DIAGNOSIS — R232 Flushing: Secondary | ICD-10-CM

## 2018-03-07 DIAGNOSIS — E785 Hyperlipidemia, unspecified: Secondary | ICD-10-CM | POA: Insufficient documentation

## 2018-03-07 DIAGNOSIS — R600 Localized edema: Secondary | ICD-10-CM | POA: Insufficient documentation

## 2018-03-07 DIAGNOSIS — N951 Menopausal and female climacteric states: Secondary | ICD-10-CM | POA: Insufficient documentation

## 2018-03-07 DIAGNOSIS — R0982 Postnasal drip: Secondary | ICD-10-CM | POA: Insufficient documentation

## 2018-03-07 DIAGNOSIS — I10 Essential (primary) hypertension: Secondary | ICD-10-CM | POA: Insufficient documentation

## 2018-03-07 DIAGNOSIS — K2990 Gastroduodenitis, unspecified, without bleeding: Secondary | ICD-10-CM

## 2018-03-07 DIAGNOSIS — B9681 Helicobacter pylori [H. pylori] as the cause of diseases classified elsewhere: Secondary | ICD-10-CM | POA: Insufficient documentation

## 2018-03-07 DIAGNOSIS — K2970 Gastritis, unspecified, without bleeding: Secondary | ICD-10-CM | POA: Insufficient documentation

## 2018-03-07 MED ORDER — CETIRIZINE HCL 10 MG PO TABS: 10.0000 mg | ORAL_TABLET | Freq: Every day | ORAL | 1 refills | Status: DC

## 2018-03-07 MED ORDER — LISINOPRIL-HYDROCHLOROTHIAZIDE 20-12.5 MG PO TABS: 1.0000 | ORAL_TABLET | Freq: Every day | ORAL | 6 refills | Status: DC

## 2018-03-07 MED ORDER — MELOXICAM 7.5 MG PO TABS: 7.5000 mg | ORAL_TABLET | Freq: Every day | ORAL | 2 refills | Status: DC

## 2018-03-07 MED ORDER — ATORVASTATIN CALCIUM 20 MG PO TABS: 20.0000 mg | ORAL_TABLET | Freq: Every day | ORAL | 6 refills | Status: DC

## 2018-03-07 MED ORDER — PANTOPRAZOLE SODIUM 40 MG PO TBEC: 40.0000 mg | DELAYED_RELEASE_TABLET | Freq: Every day | ORAL | 6 refills | Status: DC

## 2018-03-07 MED ORDER — CLONIDINE HCL 0.1 MG PO TABS: 0.1000 mg | ORAL_TABLET | Freq: Every day | ORAL | 6 refills | Status: DC

## 2018-03-07 MED ORDER — GABAPENTIN 300 MG PO CAPS: 300.0000 mg | ORAL_CAPSULE | Freq: Three times a day (TID) | ORAL | 6 refills | Status: DC

## 2018-03-07 MED FILL — cloNIDine HCL 0.1 MG TABS: 0.1 | 30 days supply | Qty: 30 | Fill #0

## 2018-03-07 MED FILL — MELOXICAM 7.5 MG TABLET: 7.5 | 30 days supply | Qty: 30 | Fill #0

## 2018-03-07 MED FILL — LISINOPRIL-HCTZ 20-12.5 TAB: 20-12.5 | 30 days supply | Qty: 30 | Fill #0

## 2018-03-07 NOTE — Progress Notes (Signed)
Patient is having swelling in lower legs.

## 2018-03-07 NOTE — Progress Notes (Signed)
Subjective:  Patient ID: Alice Woodard, female    DOB: 1971-10-18  Age: 47 y.o. MRN: [id number]  CC: Hypertension   HPI Tenea Sens  is a 47 year old female with a history of hypertension, Achilles tendinitis, dyslipidemia,  peptic ulcer secondary to H. pylori status post cauterization of bleeding vessels who presents today for a follow-up visit.  She complains of pedal edema which is worse at the end of the day but denies shortness of breath, chest pain, orthopnea. She also complains of right wrist pain with associated numbness radiating from her hand to her forearm which is chronic and worse when she is asleep.  Of note she has a diagnosis of carpal tunnel syndrome and was prescribed gabapentin which she has run out of. Also complains of pain in her right submandibular region and postnasal drip.  She was prescribed a medication for seasonal allergies which she states she has run out of.  Peptic ulcer disease is controlled with no recent flares, no nausea, vomiting, abdominal pain. She tolerates her antihypertensive and denies adverse effects from the medication.  Past Medical History:  Diagnosis Date  . Anemia   . History of blood transfusion 04/17/2017   "anemic"  . Hyperlipidemia   . Hypertension     Past Surgical History:  Procedure Laterality Date  . APPENDECTOMY    . CHOLECYSTECTOMY OPEN    . ESOPHAGOGASTRODUODENOSCOPY N/A 04/18/2017   Procedure: ESOPHAGOGASTRODUODENOSCOPY (EGD);  Surgeon: Mauri Pole, MD;  Location: [facility] ENDOSCOPY;  Service: Endoscopy;  Laterality: N/A;    Allergies  Allergen Reactions  . Amlodipine Rash     Outpatient Medications Prior to Visit  Medication Sig Dispense Refill  . polyethylene glycol (MIRALAX) packet Take 17 g by mouth daily. 14 each 0  . amLODipine (NORVASC) 10 MG tablet Take 1 tablet (10 mg total) by mouth daily. 30 tablet 6  . atorvastatin (LIPITOR) 20 MG tablet Take 1 tablet (20 mg total) by mouth daily. 30 tablet 6  .  cetirizine (ZYRTEC) 10 MG tablet Take 1 tablet (10 mg total) by mouth daily. 30 tablet 1  . cloNIDine (CATAPRES) 0.1 MG tablet Take 1 tablet (0.1 mg total) by mouth at bedtime. For hotflashes 30 tablet 6  . diclofenac sodium (VOLTAREN) 1 % GEL Apply 4 g topically 4 (four) times daily. 100 g 1  . gabapentin (NEURONTIN) 300 MG capsule Take 1 capsule (300 mg total) by mouth 3 (three) times daily. 60 capsule 6  . lisinopril (PRINIVIL,ZESTRIL) 5 MG tablet Take 1 tablet (5 mg total) by mouth daily. 30 tablet 6  . pantoprazole (PROTONIX) 40 MG tablet Take 1 tablet (40 mg total) by mouth 2 (two) times daily. 60 tablet 6  . acetaminophen (TYLENOL) 325 MG tablet Take 2 tablets (650 mg total) by mouth every 6 (six) hours as needed for mild pain (or Fever >/= 101). (Patient not taking: Reported on 10/14/2017) 60 tablet 0  . ferrous sulfate 325 (65 FE) MG tablet Take 1 tablet (325 mg total) by mouth daily. (Patient not taking: Reported on 10/14/2017) 60 tablet 0  . methocarbamol (ROBAXIN) 500 MG tablet Take 1 tablet (500 mg total) by mouth 4 (four) times daily. (Patient not taking: Reported on 10/14/2017) 60 tablet 1  . potassium chloride (K-DUR) 10 MEQ tablet Take 1 tablet (10 mEq total) by mouth daily. (Patient not taking: Reported on 10/14/2017) 30 tablet 2   No facility-administered medications prior to visit.     ROS Review of Systems  Constitutional: Negative for activity change, appetite change and fatigue.  HENT: Negative for congestion, sinus pressure and sore throat.   Eyes: Negative for visual disturbance.  Respiratory: Negative for cough, chest tightness, shortness of breath and wheezing.   Cardiovascular: Negative for chest pain and palpitations.  Gastrointestinal: Negative for abdominal distention, abdominal pain and constipation.  Endocrine: Negative for polydipsia.  Genitourinary: Negative for dysuria and frequency.  Musculoskeletal: Negative for arthralgias and back pain.  Skin: Negative for  rash.  Neurological: Positive for numbness. Negative for tremors and light-headedness.  Hematological: Does not bruise/bleed easily.  Psychiatric/Behavioral: Negative for agitation and behavioral problems.    Objective:  BP (!) 143/81   Pulse 65   Temp 98 F (36.7 C) (Oral)   Ht '4\' 10"'  (1.473 m)   Wt 121 lb 12.8 oz (55.2 kg)   LMP 11/15/2014   SpO2 (!) 65%   BMI 25.46 kg/m   BP/Weight 03/07/2018 12/07/2017 01/21/5086  Systolic BP 199 412 904  Diastolic BP 81 84 72  Wt. (Lbs) 121.8 - 116  BMI 25.46 - 24.24      Physical Exam  Constitutional: She is oriented to person, place, and time. She appears well-developed and well-nourished.  Cardiovascular: Normal rate, normal heart sounds and intact distal pulses.  No murmur heard. Pulmonary/Chest: Effort normal and breath sounds normal. She has no wheezes. She has no rales. She exhibits no tenderness.  Abdominal: Soft. Bowel sounds are normal. She exhibits no distension and no mass. There is no tenderness.  Musculoskeletal: Normal range of motion.  Negative Phalen's and Tinel signs  Neurological: She is alert and oriented to person, place, and time.  Psychiatric: She has a normal mood and affect.     CMP Latest Ref Rng & Units 12/07/2017 10/14/2017 05/30/2017  Glucose 65 - 99 mg/dL 125(H) 75 82  BUN 6 - 20 mg/dL '6 7 6  ' Creatinine 0.44 - 1.00 mg/dL 0.71 0.78 0.78  Sodium 135 - 145 mmol/L 140 144 147(H)  Potassium 3.5 - 5.1 mmol/L 3.5 4.2 4.0  Chloride 101 - 111 mmol/L 105 106 105  CO2 22 - 32 mmol/L '25 23 25  ' Calcium 8.9 - 10.3 mg/dL 9.4 9.7 10.1  Total Protein 6.5 - 8.1 g/dL 7.8 7.8 -  Total Bilirubin 0.3 - 1.2 mg/dL 1.2 0.7 -  Alkaline Phos 38 - 126 U/L 152(H) 181(H) -  AST 15 - 41 U/L 43(H) 18 -  ALT 14 - 54 U/L 22 7 -    Lipid Panel     Component Value Date/Time   CHOL 225 (H) 10/14/2017 0946   TRIG 239 (H) 10/14/2017 0946   HDL 41 10/14/2017 0946   CHOLHDL 5.5 (H) 10/14/2017 0946   CHOLHDL 4.8 10/26/2016 0933    VLDL 22 10/26/2016 0933   LDLCALC 136 (H) 10/14/2017 0946    Assessment & Plan:   1. Essential hypertension, benign Slightly elevated Discontinue amlodipine due to complaints of pedal edema Discontinued lisinopril 5 mg and substituted with lisinopril/HCTZ due to additional diuretic effect. - lisinopril-hydrochlorothiazide (ZESTORETIC) 20-12.5 MG tablet; Take 1 tablet by mouth daily.  Dispense: 30 tablet; Refill: 6 - CMP14+EGFR - Lipid panel  2. Carpal tunnel syndrome of right wrist Uncontrolled Advised to use wrist brace Placed on meloxicam - gabapentin (NEURONTIN) 300 MG capsule; Take 1 capsule (300 mg total) by mouth 3 (three) times daily.  Dispense: 60 capsule; Refill: 6 - meloxicam (MOBIC) 7.5 MG tablet; Take 1 tablet (7.5 mg total) by mouth daily.  Dispense: 30 tablet; Refill: 2  3. Dyslipidemia Controlled We will check lipid panel today Low-cholesterol diet - atorvastatin (LIPITOR) 20 MG tablet; Take 1 tablet (20 mg total) by mouth daily.  Dispense: 30 tablet; Refill: 6  4. Hot flashes Stable - cloNIDine (CATAPRES) 0.1 MG tablet; Take 1 tablet (0.1 mg total) by mouth at bedtime. For hotflashes  Dispense: 30 tablet; Refill: 6  5. Post-nasal drip Uncontrolled due to running out of Zyrtec which I have refilled - cetirizine (ZYRTEC) 10 MG tablet; Take 1 tablet (10 mg total) by mouth daily.  Dispense: 30 tablet; Refill: 1  6. Gastritis and gastroduodenitis Stable - pantoprazole (PROTONIX) 40 MG tablet; Take 1 tablet (40 mg total) by mouth daily.  Dispense: 30 tablet; Refill: 6  7. Pedal edema Likely dependent versus medication induced-amlodipine Discontinued amlodipine Hopefully addition of HCTZ will provide some relief.   Meds ordered this encounter  Medications  . lisinopril-hydrochlorothiazide (ZESTORETIC) 20-12.5 MG tablet    Sig: Take 1 tablet by mouth daily.    Dispense:  30 tablet    Refill:  6    Discontinue lisinopril 63m. Discontinue amlodipine  .  gabapentin (NEURONTIN) 300 MG capsule    Sig: Take 1 capsule (300 mg total) by mouth 3 (three) times daily.    Dispense:  60 capsule    Refill:  6    Discontinue previous dose  . meloxicam (MOBIC) 7.5 MG tablet    Sig: Take 1 tablet (7.5 mg total) by mouth daily.    Dispense:  30 tablet    Refill:  2  . atorvastatin (LIPITOR) 20 MG tablet    Sig: Take 1 tablet (20 mg total) by mouth daily.    Dispense:  30 tablet    Refill:  6  . cloNIDine (CATAPRES) 0.1 MG tablet    Sig: Take 1 tablet (0.1 mg total) by mouth at bedtime. For hotflashes    Dispense:  30 tablet    Refill:  6  . cetirizine (ZYRTEC) 10 MG tablet    Sig: Take 1 tablet (10 mg total) by mouth daily.    Dispense:  30 tablet    Refill:  1  . pantoprazole (PROTONIX) 40 MG tablet    Sig: Take 1 tablet (40 mg total) by mouth daily.    Dispense:  30 tablet    Refill:  6    Follow-up: Return in about 3 months (around 06/06/2018) for Follow-up on chronic medical conditions.   ECharlott RakesMD

## 2018-03-07 NOTE — Patient Instructions (Signed)
Edema Edema is when you have too much fluid in your body or under your skin. Edema may make your legs, feet, and ankles swell up. Swelling is also common in looser tissues, like around your eyes. This is a common condition. It gets more common as you get older. There are many possible causes of edema. Eating too much salt (sodium) and being on your feet or sitting for a long time can cause edema in your legs, feet, and ankles. Hot weather may make edema worse. Edema is usually painless. Your skin may look swollen or shiny. Follow these instructions at home:  Keep the swollen body part raised (elevated) above the level of your heart when you are sitting or lying down.  Do not sit still or stand for a long time.  Do not wear tight clothes. Do not wear garters on your upper legs.  Exercise your legs. This can help the swelling go down.  Wear elastic bandages or support stockings as told by your doctor.  Eat a low-salt (low-sodium) diet to reduce fluid as told by your doctor.  Depending on the cause of your swelling, you may need to limit how much fluid you drink (fluid restriction).  Take over-the-counter and prescription medicines only as told by your doctor. Contact a doctor if:  Treatment is not working.  You have heart, liver, or kidney disease and have symptoms of edema.  You have sudden and unexplained weight gain. Get help right away if:  You have shortness of breath or chest pain.  You cannot breathe when you lie down.  You have pain, redness, or warmth in the swollen areas.  You have heart, liver, or kidney disease and get edema all of a sudden.  You have a fever and your symptoms get worse all of a sudden. Summary  Edema is when you have too much fluid in your body or under your skin.  Edema may make your legs, feet, and ankles swell up. Swelling is also common in looser tissues, like around your eyes.  Raise (elevate) the swollen body part above the level of your  heart when you are sitting or lying down.  Follow your doctor's instructions about diet and how much fluid you can drink (fluid restriction). This information is not intended to replace advice given to you by your health care provider. Make sure you discuss any questions you have with your health care provider. Document Released: 04/16/2008 Document Revised: 11/16/2016 Document Reviewed: 11/16/2016 Elsevier Interactive Patient Education  2017 [employer] Inc[employer]  

## 2018-03-08 LAB — LIPID PANEL
CHOL/HDL RATIO: 3.6 ratio (ref 0.0–4.4)
CHOLESTEROL TOTAL: 139 mg/dL (ref 100–199)
HDL: 39 mg/dL — ABNORMAL LOW (ref >39)
LDL CALC: 67 mg/dL (ref 0–99)
TRIGLYCERIDES: 164 mg/dL — AB (ref 0–149)
VLDL CHOLESTEROL CAL: 33 mg/dL (ref 5–40)

## 2018-03-08 LAB — CMP14+EGFR
ALK PHOS: 170 IU/L — AB (ref 39–117)
ALT: 11 IU/L (ref 0–32)
AST: 23 IU/L (ref 0–40)
Albumin/Globulin Ratio: 1.6 (ref 1.2–2.2)
Albumin: 4.4 g/dL (ref 3.5–5.5)
BUN/Creatinine Ratio: 8 — ABNORMAL LOW (ref 9–23)
BUN: 5 mg/dL — ABNORMAL LOW (ref 6–24)
Bilirubin Total: 1 mg/dL (ref 0.0–1.2)
CALCIUM: 9.4 mg/dL (ref 8.7–10.2)
CO2: 24 mmol/L (ref 20–29)
CREATININE: 0.65 mg/dL (ref 0.57–1.00)
Chloride: 104 mmol/L (ref 96–106)
GFR calc Af Amer: 121 mL/min/{1.73_m2} (ref >59)
GFR, EST NON AFRICAN AMERICAN: 105 mL/min/{1.73_m2} (ref >59)
GLOBULIN, TOTAL: 2.8 g/dL (ref 1.5–4.5)
GLUCOSE: 80 mg/dL (ref 65–99)
Potassium: 2.9 mmol/L — ABNORMAL LOW (ref 3.5–5.2)
Sodium: 144 mmol/L (ref 134–144)
Total Protein: 7.2 g/dL (ref 6.0–8.5)

## 2018-03-10 ENCOUNTER — Other Ambulatory Visit: Payer: Self-pay | Admitting: Family Medicine

## 2018-03-10 MED ORDER — POTASSIUM CHLORIDE CRYS ER 10 MEQ PO TBCR: 10.0000 meq | EXTENDED_RELEASE_TABLET | Freq: Every day | ORAL | 3 refills | Status: DC

## 2018-03-20 ENCOUNTER — Telehealth: Payer: Self-pay

## 2018-03-20 NOTE — Telephone Encounter (Signed)
-----   Message from Charlott Rakes, MD sent at 03/10/2018  9:01 AM EDT ----- Cholesterol is normal, potassium is low and I have sent a prescription for a replacement to her pharmacy.  One of her liver enzymes is elevated, we will monitor at this, no regimen change for now.

## 2018-03-20 NOTE — Telephone Encounter (Signed)
Patient was called and informed of lab results. 

## 2018-03-24 MED FILL — GABAPENTIN 300 MG CAPSULE: 300 | 20 days supply | Qty: 60 | Fill #5

## 2018-03-24 MED FILL — ATORVASTATIN 20 MG TABLET: 20 | 30 days supply | Qty: 30 | Fill #5

## 2018-03-25 MED FILL — POTASSIUM CL 10 MEQ TAB SA: 10 | 30 days supply | Qty: 30 | Fill #0

## 2018-04-01 MED FILL — PANTOPRAZOLE SOD DR 40 MG T: 40 | 30 days supply | Qty: 60 | Fill #3

## 2018-04-01 MED FILL — LISINOPRIL-HCTZ 20-12.5 MG: 20-12.5 | 30 days supply | Qty: 30 | Fill #1

## 2018-04-01 MED FILL — MELOXICAM 7.5 MG TABLET: 7.5 | 30 days supply | Qty: 30 | Fill #1

## 2018-04-04 ENCOUNTER — Ambulatory Visit: Payer: Self-pay

## 2018-04-15 MED FILL — GABAPENTIN 300 MG CAPSULE: 300 | 20 days supply | Qty: 60 | Fill #6

## 2018-04-15 MED FILL — cloNIDine HCL 0.1 MG TABS: 0.1 | 30 days supply | Qty: 30 | Fill #1

## 2018-04-24 MED FILL — POTASSIUM CL 10 MEQ TAB SA: 10 | 30 days supply | Qty: 30 | Fill #1

## 2018-04-24 MED FILL — ?ATORVASTATIN 20 MG TABLET: 20 | 30 days supply | Qty: 30 | Fill #6

## 2018-04-25 ENCOUNTER — Ambulatory Visit: Payer: Self-pay | Attending: Family Medicine

## 2018-05-07 MED FILL — LISINOPRIL-HCTZ 20-12.5 MG: 20-12.5 | 30 days supply | Qty: 30 | Fill #2

## 2018-05-07 MED FILL — MELOXICAM 7.5 MG TABLET: 7.5 | 30 days supply | Qty: 30 | Fill #2

## 2018-05-07 MED FILL — ?PANTOPRAZOLE SO DR 40MG TA: 40 | 30 days supply | Qty: 60 | Fill #4

## 2018-05-13 IMAGING — CT CT ABD-PELV W/ CM
2 of 5 series · 17 of 46 positions shown, 19 images · IV contrast (iopamidol)
Comparison: None.

CLINICAL DATA: Abdominal distention.  Constipation for 3 weeks.

EXAM:
CT ABDOMEN AND PELVIS WITH CONTRAST
TECHNIQUE: Multidetector CT imaging of the abdomen and pelvis was performed
using the standard protocol following bolus administration of
intravenous contrast.
CONTRAST:  100mL B8HG0W-IBB IOPAMIDOL (B8HG0W-IBB) INJECTION 61%

[Series 3: abd/ pelvis 5.0 i30f 2 · axial · 0.76mm/px · z∈[+762,+1137]mm · 14 of 85 slices shown, 16 images]
[im 5/85  soft-tissue]
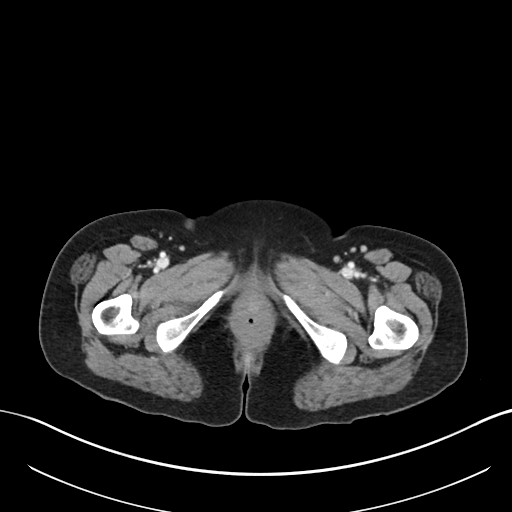
[im 5/85  bone]
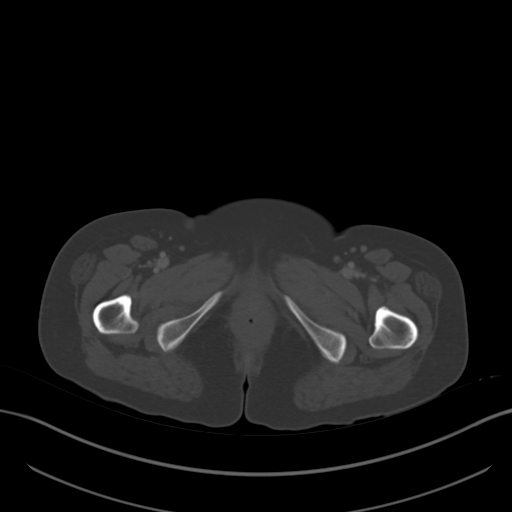
[im 13/85  soft-tissue]
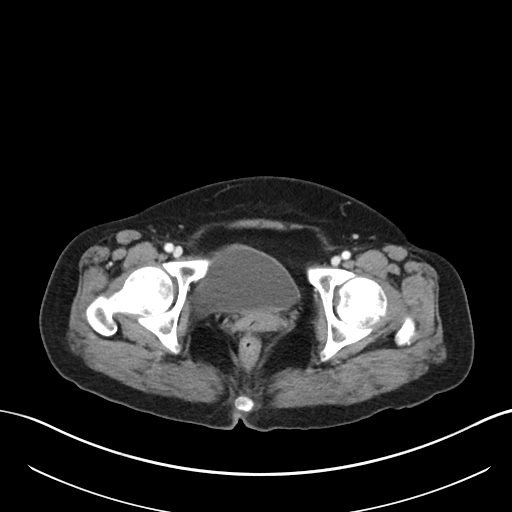
[im 17/85  soft-tissue]
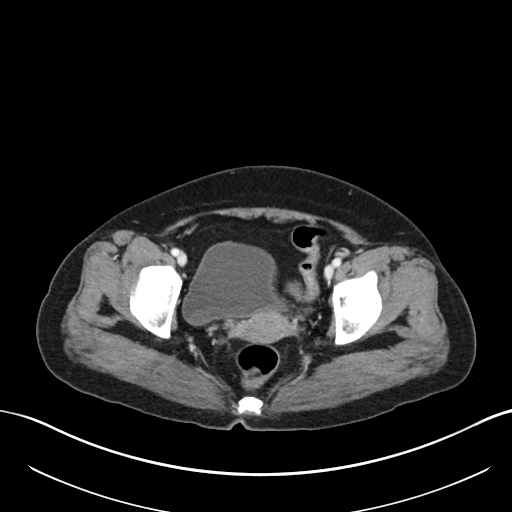
[im 22/85  soft-tissue]
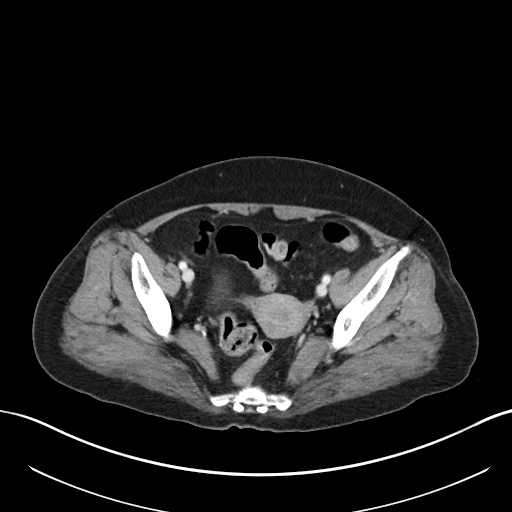
[im 30/85  soft-tissue]
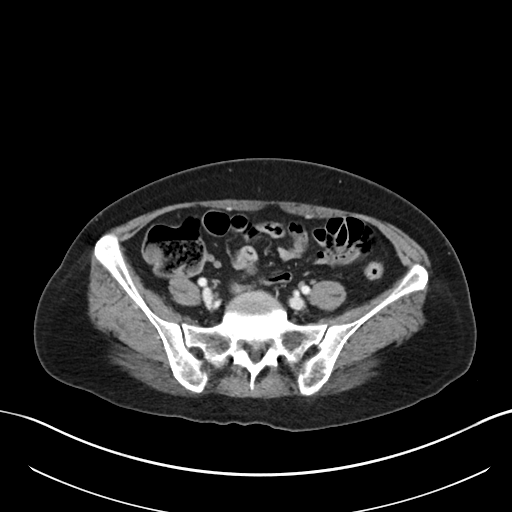
[im 34/85  soft-tissue]
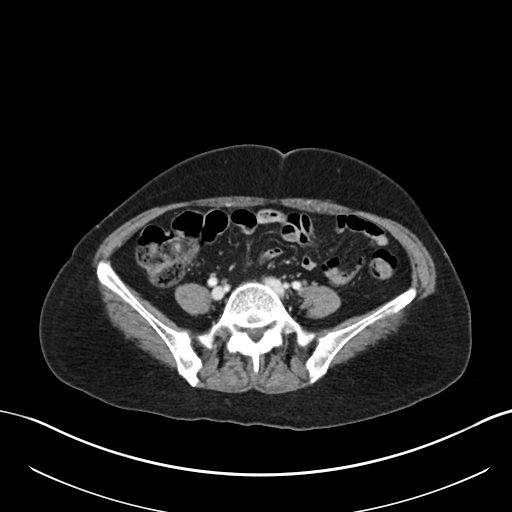
[im 38/85  soft-tissue]
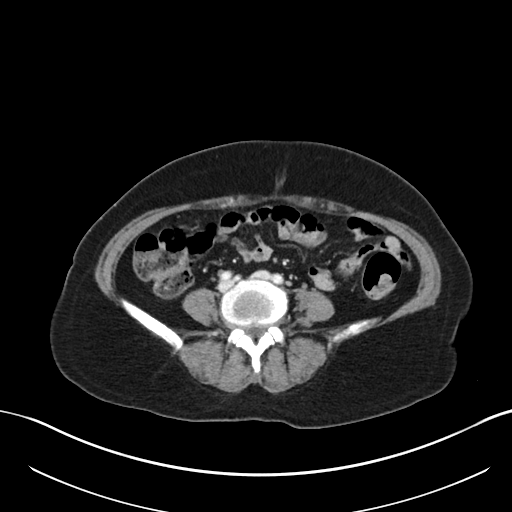
[im 47/85  soft-tissue]
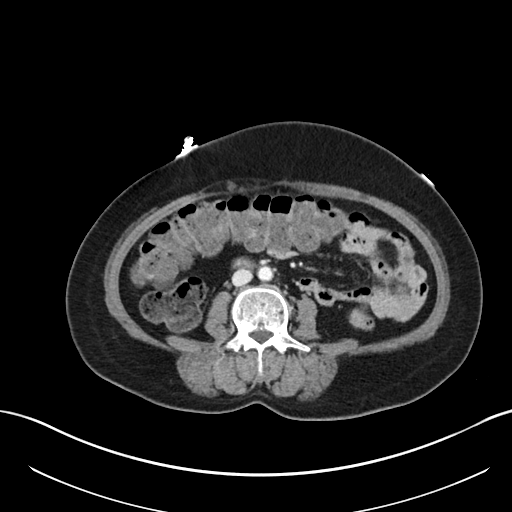
[im 51/85  soft-tissue]
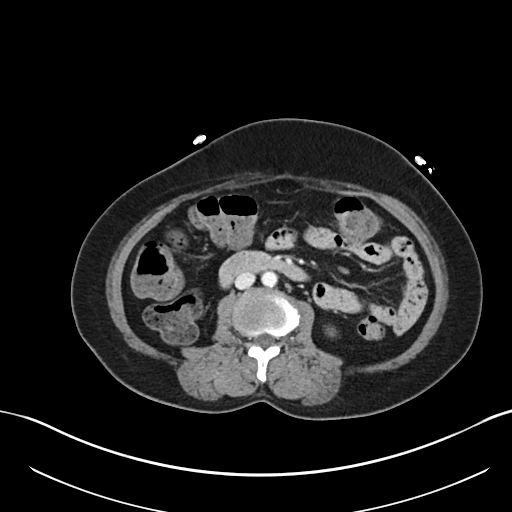
[im 51/85  bone]
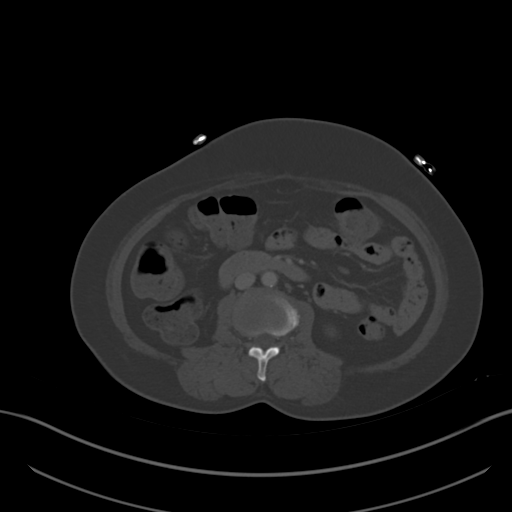
[im 55/85  soft-tissue]
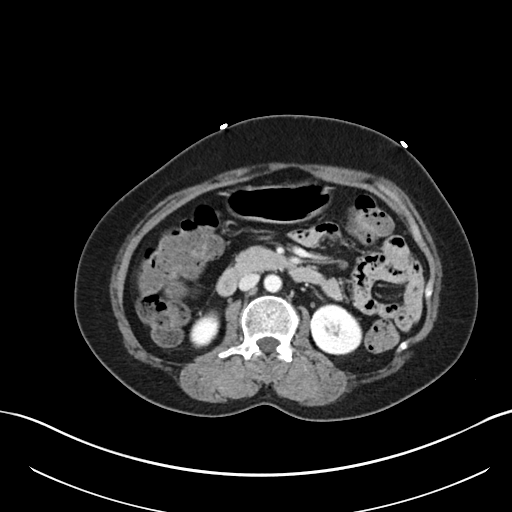
[im 64/85  soft-tissue]
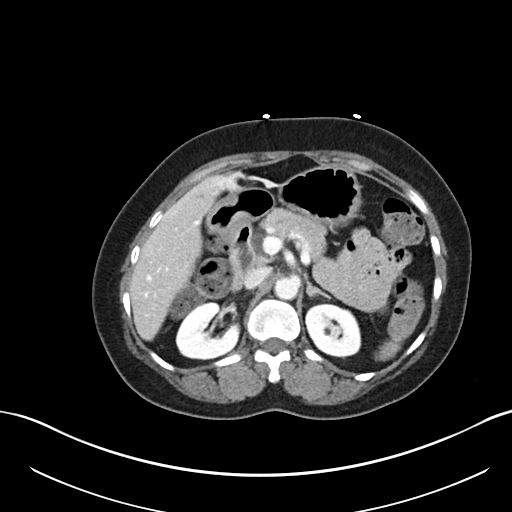
[im 68/85  soft-tissue]
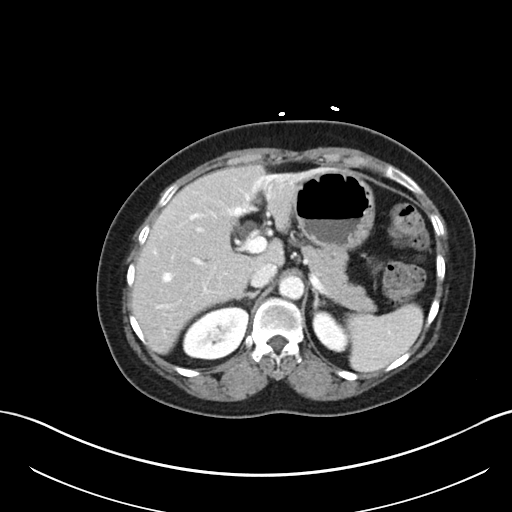
[im 72/85  soft-tissue]
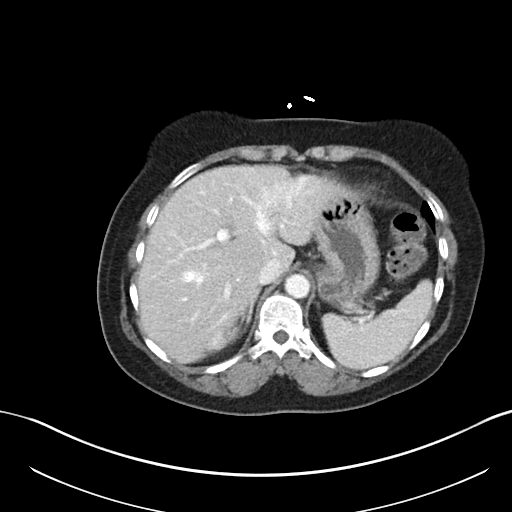
[im 80/85  soft-tissue]
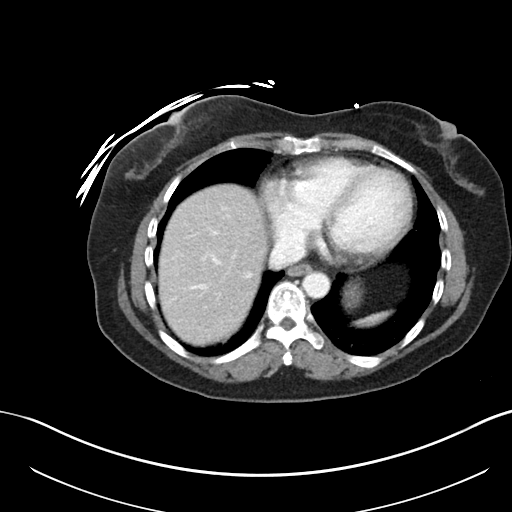

[Series 6: coronal soft tissue · coronal · 0.76mm/px · 3 of 101 slices shown]
[im 34/101  soft-tissue]
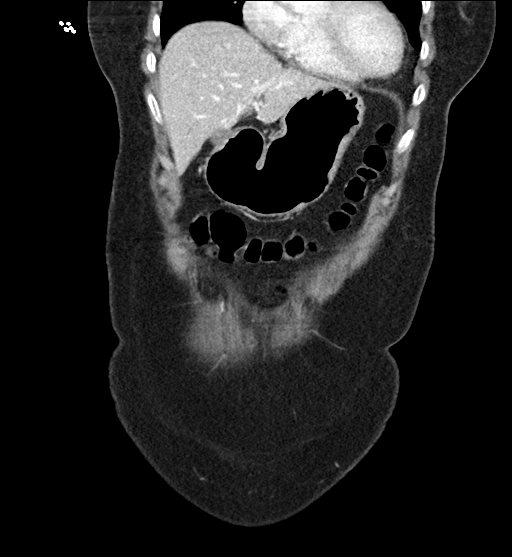
[im 45/101  soft-tissue]
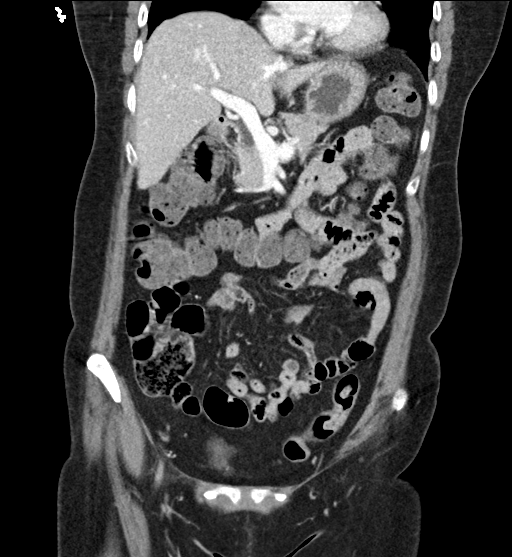
[im 56/101  soft-tissue]
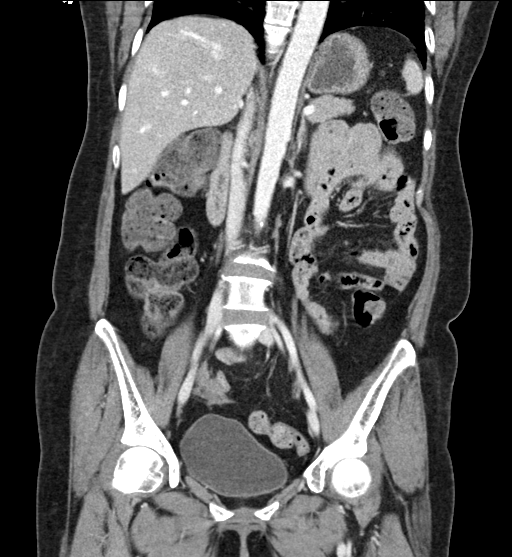

[17 of 46 positions shown; findings below may reference images not displayed]

FINDINGS: Lower chest:  No contributory findings.

Hepatobiliary: Hyperperfusion of the ventral left liver which is
atrophic with volume loss. This is separate from the quadrate lobe.
Cholecystectomy. No common bile duct dilatation.No evidence of
biliary obstruction or stone.

Pancreas: Unremarkable.

Spleen: Unremarkable.

Adrenals/Urinary Tract: Negative adrenals. No hydronephrosis or
stone. Unremarkable bladder.

Stomach/Bowel: No appendicitis. Confluent stool throughout the
proximal colon. No rectal impaction or obstruction.

Vascular/Lymphatic: No acute vascular abnormality. No mass or
adenopathy.

Reproductive:No pathologic findings.

Other: No ascites or pneumoperitoneum.

Musculoskeletal: No acute or aggressive finding.
IMPRESSION: 1. No acute finding.
2. History of abdominal distention and constipation. Stool volume is
moderate. No rectal impaction.
3. Altered perfusion and atrophy in the left lobe liver best
attributed to old vascular insult.

## 2018-05-19 MED FILL — cloNIDine HCL 0.1 MG TABS: 0.1 | 30 days supply | Qty: 30 | Fill #2

## 2018-05-26 MED FILL — POTASSIUM CL 10 MEQ TAB SA: 10 | 30 days supply | Qty: 30 | Fill #2

## 2018-06-09 MED FILL — ?LISINOPRIL-HCTZ 20/12.5 TA: 20-12.5 | 30 days supply | Qty: 30 | Fill #3

## 2018-06-12 ENCOUNTER — Ambulatory Visit: Payer: Self-pay | Attending: Family Medicine | Admitting: Family Medicine

## 2018-06-12 VITALS — BP 173/92 | HR 59 | Temp 97.9°F | Ht <= 58 in | Wt 120.2 lb

## 2018-06-12 DIAGNOSIS — E785 Hyperlipidemia, unspecified: Secondary | ICD-10-CM

## 2018-06-12 DIAGNOSIS — E876 Hypokalemia: Secondary | ICD-10-CM

## 2018-06-12 DIAGNOSIS — Z79899 Other long term (current) drug therapy: Secondary | ICD-10-CM | POA: Insufficient documentation

## 2018-06-12 DIAGNOSIS — R0982 Postnasal drip: Secondary | ICD-10-CM

## 2018-06-12 DIAGNOSIS — Z888 Allergy status to other drugs, medicaments and biological substances status: Secondary | ICD-10-CM | POA: Insufficient documentation

## 2018-06-12 DIAGNOSIS — R059 Cough, unspecified: Secondary | ICD-10-CM

## 2018-06-12 DIAGNOSIS — Z9049 Acquired absence of other specified parts of digestive tract: Secondary | ICD-10-CM | POA: Insufficient documentation

## 2018-06-12 DIAGNOSIS — I10 Essential (primary) hypertension: Secondary | ICD-10-CM

## 2018-06-12 DIAGNOSIS — R05 Cough: Secondary | ICD-10-CM

## 2018-06-12 DIAGNOSIS — J029 Acute pharyngitis, unspecified: Secondary | ICD-10-CM | POA: Insufficient documentation

## 2018-06-12 MED ORDER — ATORVASTATIN CALCIUM 20 MG PO TABS: 20.0000 mg | ORAL_TABLET | Freq: Every day | ORAL | 6 refills | Status: DC

## 2018-06-12 MED ORDER — POTASSIUM CHLORIDE CRYS ER 10 MEQ PO TBCR: 10.0000 meq | EXTENDED_RELEASE_TABLET | Freq: Every day | ORAL | 3 refills | Status: DC

## 2018-06-12 MED ORDER — CETIRIZINE HCL 10 MG PO TABS
10.0000 mg | ORAL_TABLET | Freq: Every day | ORAL | 1 refills | Status: DC
Start: 2018-06-12 — End: 2018-09-18

## 2018-06-12 MED ORDER — BENZONATATE 100 MG PO CAPS: 100.0000 mg | ORAL_CAPSULE | Freq: Two times a day (BID) | ORAL | 0 refills | Status: DC | PRN

## 2018-06-12 MED FILL — BENZONATATE 100 MG CAP: 100 | 10 days supply | Qty: 20 | Fill #0

## 2018-06-12 MED FILL — ?CETIRIZINE HCL 10 MG TABLE: 10 | 30 days supply | Qty: 30 | Fill #0

## 2018-06-12 MED FILL — ATORVASTATIN 20 MG TABLET: 20 | 30 days supply | Qty: 30 | Fill #0

## 2018-06-12 NOTE — Patient Instructions (Signed)

## 2018-06-12 NOTE — Progress Notes (Signed)
Subjective:  Patient ID: Alice Woodard, female    DOB: [birth year]  Age: 47 y.o. MRN: [id number]  CC: Hypertension and Sore Throat   HPI Alice Woodard is a 47 year old female with a history of hypertension, Achilles tendinitis, dyslipidemia,  peptic ulcer secondary to H. pylori status post cauterization of bleeding vessels who presents today for a follow-up visit. She complains of cough, postnasal drip and a sore throat with cough worse at night for the last 1-1/2 months and she describes a sensation of her throat itching denies headaches, fever or sinus tenderness. She has been compliant with her antihypertensive but has not been taking her statin.  Her blood pressure is elevated today and she is yet to take her antihypertensive in anticipation of fasting labs. She has not needed gabapentin which she was previously taking for Achilles tendinitis. Denies epigastric pain, nausea, vomiting or diarrhea.  Past Medical History:  Diagnosis Date  . Anemia   . History of blood transfusion 04/17/2017   "anemic"  . Hyperlipidemia   . Hypertension     Past Surgical History:  Procedure Laterality Date  . APPENDECTOMY    . CHOLECYSTECTOMY OPEN    . ESOPHAGOGASTRODUODENOSCOPY N/A 04/18/2017   Procedure: ESOPHAGOGASTRODUODENOSCOPY (EGD);  Surgeon: Mauri Pole, MD;  Location: [facility] ENDOSCOPY;  Service: Endoscopy;  Laterality: N/A;    Allergies  Allergen Reactions  . Amlodipine Rash     Outpatient Medications Prior to Visit  Medication Sig Dispense Refill  . cloNIDine (CATAPRES) 0.1 MG tablet Take 1 tablet (0.1 mg total) by mouth at bedtime. For hotflashes 30 tablet 6  . lisinopril-hydrochlorothiazide (ZESTORETIC) 20-12.5 MG tablet Take 1 tablet by mouth daily. 30 tablet 6  . pantoprazole (PROTONIX) 40 MG tablet Take 1 tablet (40 mg total) by mouth daily. 30 tablet 6  . potassium chloride (K-DUR,KLOR-CON) 10 MEQ tablet Take 1 tablet (10 mEq total) by mouth daily. 30 tablet 3  .  acetaminophen (TYLENOL) 325 MG tablet Take 2 tablets (650 mg total) by mouth every 6 (six) hours as needed for mild pain (or Fever >/= 101). (Patient not taking: Reported on 10/14/2017) 60 tablet 0  . ferrous sulfate 325 (65 FE) MG tablet Take 1 tablet (325 mg total) by mouth daily. (Patient not taking: Reported on 10/14/2017) 60 tablet 0  . atorvastatin (LIPITOR) 20 MG tablet Take 1 tablet (20 mg total) by mouth daily. (Patient not taking: Reported on 06/12/2018) 30 tablet 6  . cetirizine (ZYRTEC) 10 MG tablet Take 1 tablet (10 mg total) by mouth daily. (Patient not taking: Reported on 06/12/2018) 30 tablet 1  . gabapentin (NEURONTIN) 300 MG capsule Take 1 capsule (300 mg total) by mouth 3 (three) times daily. (Patient not taking: Reported on 06/12/2018) 60 capsule 6  . meloxicam (MOBIC) 7.5 MG tablet Take 1 tablet (7.5 mg total) by mouth daily. (Patient not taking: Reported on 06/12/2018) 30 tablet 2  . polyethylene glycol (MIRALAX) packet Take 17 g by mouth daily. (Patient not taking: Reported on 06/12/2018) 14 each 0   No facility-administered medications prior to visit.     ROS Review of Systems  Constitutional: Negative for activity change, appetite change and fatigue.  HENT: Positive for sore throat. Negative for congestion and sinus pressure.   Eyes: Negative for visual disturbance.  Respiratory: Positive for cough. Negative for chest tightness, shortness of breath and wheezing.   Cardiovascular: Negative for chest pain and palpitations.  Gastrointestinal: Negative for abdominal distention, abdominal pain and constipation.  Endocrine:  Negative for polydipsia.  Genitourinary: Negative for dysuria and frequency.  Musculoskeletal: Negative for arthralgias and back pain.  Skin: Negative for rash.  Neurological: Negative for tremors, light-headedness and numbness.  Hematological: Does not bruise/bleed easily.  Psychiatric/Behavioral: Negative for agitation and behavioral problems.    Objective:    BP (!) 173/92   Pulse (!) 59   Temp 97.9 F (36.6 C) (Oral)   Ht 4\' 10"  (1.473 m)   Wt 120 lb 3.2 oz (54.5 kg)   LMP 11/15/2014   SpO2 100%   BMI 25.12 kg/m   BP/Weight 06/12/2018 03/07/2018 02/08/5187  Systolic BP 416 606 301  Diastolic BP 92 81 84  Wt. (Lbs) 120.2 121.8 -  BMI 25.12 25.46 -      Physical Exam  Constitutional: She is oriented to person, place, and time. She appears well-developed and well-nourished.  HENT:  Right Ear: Tympanic membrane normal.  Left Ear: Tympanic membrane normal.  Mouth/Throat: Oropharynx is clear and moist. No oropharyngeal exudate.  Cardiovascular: Normal rate, normal heart sounds and intact distal pulses.  No murmur heard. Pulmonary/Chest: Effort normal and breath sounds normal. She has no wheezes. She has no rales. She exhibits no tenderness.  Abdominal: Soft. Bowel sounds are normal. She exhibits no distension and no mass. There is no tenderness.  Musculoskeletal: Normal range of motion.  Lymphadenopathy:    She has no cervical adenopathy.  Neurological: She is alert and oriented to person, place, and time.     Assessment & Plan:   1. Post-nasal drip Could explain her cough and sore throat - cetirizine (ZYRTEC) 10 MG tablet; Take 1 tablet (10 mg total) by mouth daily.  Dispense: 30 tablet; Refill: 1  2. Dyslipidemia Has not been taking her statin I have advised her to resume this Low-cholesterol diet - atorvastatin (LIPITOR) 20 MG tablet; Take 1 tablet (20 mg total) by mouth daily.  Dispense: 30 tablet; Refill: 6  3. Essential hypertension, benign Uncontrolled due to not taking antihypertensives this morning Compliance emphasized Counseled on blood pressure goal of less than 130/80, low-sodium, DASH diet, medication compliance, 150 minutes of moderate intensity exercise per week. Discussed medication compliance, adverse effects.  4. Cough Likely from postnasal drip - benzonatate (TESSALON) 100 MG capsule; Take 1 capsule  (100 mg total) by mouth 2 (two) times daily as needed for cough.  Dispense: 20 capsule; Refill: 0  5. Hypokalemia Last potassium was normal - potassium chloride (K-DUR,KLOR-CON) 10 MEQ tablet; Take 1 tablet (10 mEq total) by mouth daily.  Dispense: 30 tablet; Refill: 3   Meds ordered this encounter  Medications  . cetirizine (ZYRTEC) 10 MG tablet    Sig: Take 1 tablet (10 mg total) by mouth daily.    Dispense:  30 tablet    Refill:  1  . benzonatate (TESSALON) 100 MG capsule    Sig: Take 1 capsule (100 mg total) by mouth 2 (two) times daily as needed for cough.    Dispense:  20 capsule    Refill:  0  . atorvastatin (LIPITOR) 20 MG tablet    Sig: Take 1 tablet (20 mg total) by mouth daily.    Dispense:  30 tablet    Refill:  6  . potassium chloride (K-DUR,KLOR-CON) 10 MEQ tablet    Sig: Take 1 tablet (10 mEq total) by mouth daily.    Dispense:  30 tablet    Refill:  3    Follow-up: Return in about 3 months (around 09/12/2018) for Follow-up of  chronic medical conditions.   Charlott Rakes MD

## 2018-06-13 ENCOUNTER — Encounter: Payer: Self-pay | Admitting: Family Medicine

## 2018-06-16 ENCOUNTER — Other Ambulatory Visit: Payer: Self-pay

## 2018-06-16 MED ORDER — TETANUS-DIPHTH-ACELL PERTUSSIS 5-2.5-18.5 LF-MCG/0.5 IM SUSP: 0.5000 mL | INTRAMUSCULAR | 0 refills | Status: DC

## 2018-06-18 MED FILL — cloNIDine HCL 0.1 MG TABS: 0.1 | 30 days supply | Qty: 30 | Fill #3

## 2018-06-25 MED FILL — POTASSIUM CL 10 MEQ TAB SA: 10 | 30 days supply | Qty: 30 | Fill #3

## 2018-07-07 MED FILL — ?PANTOPRAZOLE SO DR 40MG TA: 40 | 30 days supply | Qty: 60 | Fill #5

## 2018-07-15 MED FILL — ATORVASTATIN 20 MG TABLET: 20 | 30 days supply | Qty: 30 | Fill #1

## 2018-07-15 MED FILL — cloNIDine HCL 0.1 MG TABS: 0.1 | 30 days supply | Qty: 30 | Fill #4

## 2018-07-15 MED FILL — LISINOPRIL-HCTZ 20-12.5 MG: 20-12.5 | 30 days supply | Qty: 30 | Fill #4

## 2018-07-17 MED FILL — ?CETIRIZINE HCL 10 MG TABLE: 10 | 30 days supply | Qty: 30 | Fill #1

## 2018-08-15 MED FILL — ?CLONIDINE HCL 0.1 MG TABL: 0.1 | 30 days supply | Qty: 30 | Fill #5

## 2018-08-15 MED FILL — ?ATORVASTATIN 20 MG TABLET: 20 | 30 days supply | Qty: 30 | Fill #2

## 2018-08-15 MED FILL — LISINOPRIL-HCTZ 20-12.5 MG: 20-12.5 | 30 days supply | Qty: 30 | Fill #5

## 2018-09-05 MED FILL — ?PANTOPRAZOLE SO DR 40MG TA: 40 | 30 days supply | Qty: 60 | Fill #6

## 2018-09-15 MED FILL — LISINOPRIL-HCTZ 20-12.5 MG: 20-12.5 | 30 days supply | Qty: 30 | Fill #6

## 2018-09-15 MED FILL — ?ATORVASTATIN 20 MG TABLET: 20 | 30 days supply | Qty: 30 | Fill #3

## 2018-09-15 MED FILL — ?CLONIDINE HCL 0.1 MG TABL: 0.1 | 30 days supply | Qty: 30 | Fill #6

## 2018-09-18 ENCOUNTER — Ambulatory Visit: Payer: Self-pay | Attending: Family Medicine | Admitting: Family Medicine

## 2018-09-18 ENCOUNTER — Encounter: Payer: Self-pay | Admitting: Family Medicine

## 2018-09-18 VITALS — BP 138/90 | HR 60 | Temp 98.3°F | Ht <= 58 in | Wt 125.6 lb

## 2018-09-18 DIAGNOSIS — K2990 Gastroduodenitis, unspecified, without bleeding: Secondary | ICD-10-CM | POA: Insufficient documentation

## 2018-09-18 DIAGNOSIS — N951 Menopausal and female climacteric states: Secondary | ICD-10-CM | POA: Insufficient documentation

## 2018-09-18 DIAGNOSIS — G4700 Insomnia, unspecified: Secondary | ICD-10-CM | POA: Insufficient documentation

## 2018-09-18 DIAGNOSIS — R232 Flushing: Secondary | ICD-10-CM | POA: Insufficient documentation

## 2018-09-18 DIAGNOSIS — E876 Hypokalemia: Secondary | ICD-10-CM | POA: Insufficient documentation

## 2018-09-18 DIAGNOSIS — R140 Abdominal distension (gaseous): Secondary | ICD-10-CM | POA: Insufficient documentation

## 2018-09-18 DIAGNOSIS — K2970 Gastritis, unspecified, without bleeding: Secondary | ICD-10-CM | POA: Insufficient documentation

## 2018-09-18 DIAGNOSIS — E785 Hyperlipidemia, unspecified: Secondary | ICD-10-CM | POA: Insufficient documentation

## 2018-09-18 DIAGNOSIS — G4709 Other insomnia: Secondary | ICD-10-CM

## 2018-09-18 DIAGNOSIS — H6122 Impacted cerumen, left ear: Secondary | ICD-10-CM | POA: Insufficient documentation

## 2018-09-18 DIAGNOSIS — I10 Essential (primary) hypertension: Secondary | ICD-10-CM | POA: Insufficient documentation

## 2018-09-18 DIAGNOSIS — R0982 Postnasal drip: Secondary | ICD-10-CM | POA: Insufficient documentation

## 2018-09-18 MED ORDER — LISINOPRIL-HYDROCHLOROTHIAZIDE 20-12.5 MG PO TABS: 1.0000 | ORAL_TABLET | Freq: Every day | ORAL | 1 refills | Status: DC

## 2018-09-18 MED ORDER — CLONIDINE HCL 0.1 MG PO TABS: 0.1000 mg | ORAL_TABLET | Freq: Every day | ORAL | 1 refills | Status: DC

## 2018-09-18 MED ORDER — POTASSIUM CHLORIDE CRYS ER 10 MEQ PO TBCR: 10.0000 meq | EXTENDED_RELEASE_TABLET | Freq: Every day | ORAL | 1 refills | Status: DC

## 2018-09-18 MED ORDER — CETIRIZINE HCL 10 MG PO TABS: 10.0000 mg | ORAL_TABLET | Freq: Every day | ORAL | 1 refills | Status: DC

## 2018-09-18 MED ORDER — ATORVASTATIN CALCIUM 20 MG PO TABS: 20.0000 mg | ORAL_TABLET | Freq: Every day | ORAL | 1 refills | Status: DC

## 2018-09-18 MED ORDER — PANTOPRAZOLE SODIUM 40 MG PO TBEC: 40.0000 mg | DELAYED_RELEASE_TABLET | Freq: Every day | ORAL | 1 refills | Status: DC

## 2018-09-18 MED ORDER — TRAZODONE HCL 50 MG PO TABS: 50.0000 mg | ORAL_TABLET | Freq: Every evening | ORAL | 1 refills | Status: DC | PRN

## 2018-09-18 MED FILL — traZODone HCL 50 MG TABS: 50 | 90 days supply | Qty: 90 | Fill #0

## 2018-09-18 MED FILL — ?CETIRIZINE HCL 10 MG TABLE: 10 | 90 days supply | Qty: 90 | Fill #0

## 2018-09-18 MED FILL — POTASSIUM CHLORIDE ER 10 ME: 10 | 90 days supply | Qty: 90 | Fill #0

## 2018-09-18 NOTE — Patient Instructions (Signed)
Earwax Buildup, Adult The ears produce a substance called earwax that helps keep bacteria out of the ear and protects the skin in the ear canal. Occasionally, earwax can build up in the ear and cause discomfort or hearing loss. What increases the risk? This condition is more likely to develop in people who:  Are female.  Are elderly.  Naturally produce more earwax.  Clean their ears often with cotton swabs.  Use earplugs often.  Use in-ear headphones often.  Wear hearing aids.  Have narrow ear canals.  Have earwax that is overly thick or sticky.  Have eczema.  Are dehydrated.  Have excess hair in the ear canal.  What are the signs or symptoms? Symptoms of this condition include:  Reduced or muffled hearing.  A feeling of fullness in the ear or feeling that the ear is plugged.  Fluid coming from the ear.  Ear pain.  Ear itch.  Ringing in the ear.  Coughing.  An obvious piece of earwax that can be seen inside the ear canal.  How is this diagnosed? This condition may be diagnosed based on:  Your symptoms.  Your medical history.  An ear exam. During the exam, your health care provider will look into your ear with an instrument called an otoscope.  You may have tests, including a hearing test. How is this treated? This condition may be treated by:  Using ear drops to soften the earwax.  Having the earwax removed by a health care provider. The health care provider may: ? Flush the ear with water. ? Use an instrument that has a loop on the end (curette). ? Use a suction device.  Surgery to remove the wax buildup. This may be done in severe cases.  Follow these instructions at home:  Take over-the-counter and prescription medicines only as told by your health care provider.  Do not put any objects, including cotton swabs, into your ear. You can clean the opening of your ear canal with a washcloth or facial tissue.  Follow instructions from your health  care provider about cleaning your ears. Do not over-clean your ears.  Drink enough fluid to keep your urine clear or pale yellow. This will help to thin the earwax.  Keep all follow-up visits as told by your health care provider. If earwax builds up in your ears often or if you use hearing aids, consider seeing your health care provider for routine, preventive ear cleanings. Ask your health care provider how often you should schedule your cleanings.  If you have hearing aids, clean them according to instructions from the manufacturer and your health care provider. Contact a health care provider if:  You have ear pain.  You develop a fever.  You have blood, pus, or other fluid coming from your ear.  You have hearing loss.  You have ringing in your ears that does not go away.  Your symptoms do not improve with treatment.  You feel like the room is spinning (vertigo). Summary  Earwax can build up in the ear and cause discomfort or hearing loss.  The most common symptoms of this condition include reduced or muffled hearing and a feeling of fullness in the ear or feeling that the ear is plugged.  This condition may be diagnosed based on your symptoms, your medical history, and an ear exam.  This condition may be treated by using ear drops to soften the earwax or by having the earwax removed by a health care provider.  Do   not put any objects, including cotton swabs, into your ear. You can clean the opening of your ear canal with a washcloth or facial tissue. This information is not intended to replace advice given to you by your health care provider. Make sure you discuss any questions you have with your health care provider. Document Released: 12/06/2004 Document Revised: 01/09/2017 Document Reviewed: 01/09/2017 Elsevier Interactive Patient Education  2018 [employer]  

## 2018-09-18 NOTE — Progress Notes (Signed)
Patient still has a cough.  Patient has pain in abdomen after she eats.

## 2018-09-18 NOTE — Progress Notes (Signed)
Subjective:  Patient ID: Alice Woodard, female    DOB: [birth year]  Age: 47 y.o. MRN: [id number]  CC: Hypertension   HPI Alice Woodard is a 47 year old female with a history of hypertension, Achilles tendinitis, dyslipidemia,  peptic ulcer secondary to H. pylori status post cauterization of bleeding vessels who presents today for a follow-up visit. She complains of cough, scratchy throat and postnasal drip which is worse at night but denies sinus pressure, rhinorrhea, fever or headache. She has also noticed abdominal bloating after meals and is on a PPI which she has been compliant with.  Denies nausea vomiting. She has had trouble sleeping and denies caffeine intake.  Placed on clonidine for hot flashes but that does not help her insomnia.  She goes to bed and stays awake in bed until early hours of the morning when she falls asleep only to wake up shortly after. She is compliant with her antihypertensive and statin and denies adverse effects from her medications.  Past Medical History:  Diagnosis Date  . Anemia   . History of blood transfusion 04/17/2017   "anemic"  . Hyperlipidemia   . Hypertension     Past Surgical History:  Procedure Laterality Date  . APPENDECTOMY    . CHOLECYSTECTOMY OPEN    . ESOPHAGOGASTRODUODENOSCOPY N/A 04/18/2017   Procedure: ESOPHAGOGASTRODUODENOSCOPY (EGD);  Surgeon: Mauri Pole, MD;  Location: [facility] ENDOSCOPY;  Service: Endoscopy;  Laterality: N/A;    Allergies  Allergen Reactions  . Amlodipine Rash     Outpatient Medications Prior to Visit  Medication Sig Dispense Refill  . benzonatate (TESSALON) 100 MG capsule Take 1 capsule (100 mg total) by mouth 2 (two) times daily as needed for cough. 20 capsule 0  . atorvastatin (LIPITOR) 20 MG tablet Take 1 tablet (20 mg total) by mouth daily. 30 tablet 6  . cetirizine (ZYRTEC) 10 MG tablet Take 1 tablet (10 mg total) by mouth daily. 30 tablet 1  . cloNIDine (CATAPRES) 0.1 MG tablet Take 1 tablet  (0.1 mg total) by mouth at bedtime. For hotflashes 30 tablet 6  . lisinopril-hydrochlorothiazide (ZESTORETIC) 20-12.5 MG tablet Take 1 tablet by mouth daily. 30 tablet 6  . pantoprazole (PROTONIX) 40 MG tablet Take 1 tablet (40 mg total) by mouth daily. 30 tablet 6  . potassium chloride (K-DUR,KLOR-CON) 10 MEQ tablet Take 1 tablet (10 mEq total) by mouth daily. 30 tablet 3  . acetaminophen (TYLENOL) 325 MG tablet Take 2 tablets (650 mg total) by mouth every 6 (six) hours as needed for mild pain (or Fever >/= 101). (Patient not taking: Reported on 10/14/2017) 60 tablet 0  . ferrous sulfate 325 (65 FE) MG tablet Take 1 tablet (325 mg total) by mouth daily. (Patient not taking: Reported on 10/14/2017) 60 tablet 0  . Tdap (BOOSTRIX) 5-2.5-18.5 LF-MCG/0.5 injection Inject 0.5 mLs into the muscle as directed. 0.5 mL 0   No facility-administered medications prior to visit.     ROS Review of Systems  Constitutional: Negative for activity change, appetite change and fatigue.  HENT: Negative for congestion, sinus pressure and sore throat.   Eyes: Negative for visual disturbance.  Respiratory: Positive for cough. Negative for chest tightness, shortness of breath and wheezing.   Cardiovascular: Negative for chest pain and palpitations.  Gastrointestinal: Positive for abdominal pain. Negative for abdominal distention and constipation.  Endocrine: Negative for polydipsia.  Genitourinary: Negative for dysuria and frequency.  Musculoskeletal: Negative for arthralgias and back pain.  Skin: Negative for rash.  Neurological:  Negative for tremors, light-headedness and numbness.  Hematological: Does not bruise/bleed easily.  Psychiatric/Behavioral: Positive for sleep disturbance. Negative for agitation and behavioral problems.    Objective:  BP 138/90   Pulse 60   Temp 98.3 F (36.8 C) (Oral)   Ht 4\' 10"  (1.473 m)   Wt 125 lb 9.6 oz (57 kg)   LMP 11/15/2014   SpO2 99%   BMI 26.25 kg/m   BP/Weight  09/18/2018 06/12/2018 8/46/6599  Systolic BP 357 017 793  Diastolic BP 90 92 81  Wt. (Lbs) 125.6 120.2 121.8  BMI 26.25 25.12 25.46      Physical Exam  Constitutional: She is oriented to person, place, and time. She appears well-developed and well-nourished.  HENT:  Cerumen obscuring left tympanic membrane  Cardiovascular: Normal rate, normal heart sounds and intact distal pulses.  No murmur heard. Pulmonary/Chest: Effort normal and breath sounds normal. She has no wheezes. She has no rales. She exhibits no tenderness.  Abdominal: Soft. Bowel sounds are normal. She exhibits no distension and no mass. There is no tenderness.  Musculoskeletal: Normal range of motion.  Neurological: She is alert and oriented to person, place, and time.  Skin: Skin is warm and dry.  Psychiatric: She has a normal mood and affect.     CMP Latest Ref Rng & Units 03/07/2018 12/07/2017 10/14/2017  Glucose 65 - 99 mg/dL 80 125(H) 75  BUN 6 - 24 mg/dL 5(L) 6 7  Creatinine 0.57 - 1.00 mg/dL 0.65 0.71 0.78  Sodium 134 - 144 mmol/L 144 140 144  Potassium 3.5 - 5.2 mmol/L 2.9(L) 3.5 4.2  Chloride 96 - 106 mmol/L 104 105 106  CO2 20 - 29 mmol/L 24 25 23   Calcium 8.7 - 10.2 mg/dL 9.4 9.4 9.7  Total Protein 6.0 - 8.5 g/dL 7.2 7.8 7.8  Total Bilirubin 0.0 - 1.2 mg/dL 1.0 1.2 0.7  Alkaline Phos 39 - 117 IU/L 170(H) 152(H) 181(H)  AST 0 - 40 IU/L 23 43(H) 18  ALT 0 - 32 IU/L 11 22 7     Lipid Panel     Component Value Date/Time   CHOL 139 03/07/2018 0919   TRIG 164 (H) 03/07/2018 0919   HDL 39 (L) 03/07/2018 0919   CHOLHDL 3.6 03/07/2018 0919   CHOLHDL 4.8 10/26/2016 0933   VLDL 22 10/26/2016 0933   LDLCALC 67 03/07/2018 0919     Assessment & Plan:   1. Hypokalemia Controlled - Basic Metabolic Panel - potassium chloride (K-DUR,KLOR-CON) 10 MEQ tablet; Take 1 tablet (10 mEq total) by mouth daily.  Dispense: 90 tablet; Refill: 1  2. Post-nasal drip This could explain cough and itchy throat -  cetirizine (ZYRTEC) 10 MG tablet; Take 1 tablet (10 mg total) by mouth daily.  Dispense: 90 tablet; Refill: 1  3. Other insomnia Uncontrolled on clonidine Discussed sleep hygiene - traZODone (DESYREL) 50 MG tablet; Take 1 tablet (50 mg total) by mouth at bedtime as needed for sleep.  Dispense: 90 tablet; Refill: 1  4. Cerumen debris on tympanic membrane of left ear Debrox samples provided  5. Abdominal bloating Advised to hold off on PPI for 2 weeks and to resume after H. pylori breath test in 2 weeks - H. pylori breath test; Future  6. Essential hypertension, benign Controlled Counseled on blood pressure goal of less than 130/80, low-sodium, DASH diet, medication compliance, 150 minutes of moderate intensity exercise per week. Discussed medication compliance, adverse effects. - lisinopril-hydrochlorothiazide (ZESTORETIC) 20-12.5 MG tablet; Take 1 tablet by  mouth daily.  Dispense: 90 tablet; Refill: 1  7. Gastritis and gastroduodenitis Will need to exclude H. pylori antibody - pantoprazole (PROTONIX) 40 MG tablet; Take 1 tablet (40 mg total) by mouth daily.  Dispense: 90 tablet; Refill: 1  8. Hot flashes Controlled - cloNIDine (CATAPRES) 0.1 MG tablet; Take 1 tablet (0.1 mg total) by mouth at bedtime. For hotflashes  Dispense: 90 tablet; Refill: 1  9. Dyslipidemia Controlled Low-cholesterol diet - atorvastatin (LIPITOR) 20 MG tablet; Take 1 tablet (20 mg total) by mouth daily.  Dispense: 90 tablet; Refill: 1   Meds ordered this encounter  Medications  . cetirizine (ZYRTEC) 10 MG tablet    Sig: Take 1 tablet (10 mg total) by mouth daily.    Dispense:  90 tablet    Refill:  1  . potassium chloride (K-DUR,KLOR-CON) 10 MEQ tablet    Sig: Take 1 tablet (10 mEq total) by mouth daily.    Dispense:  90 tablet    Refill:  1  . traZODone (DESYREL) 50 MG tablet    Sig: Take 1 tablet (50 mg total) by mouth at bedtime as needed for sleep.    Dispense:  90 tablet    Refill:  1  .  pantoprazole (PROTONIX) 40 MG tablet    Sig: Take 1 tablet (40 mg total) by mouth daily.    Dispense:  90 tablet    Refill:  1  . lisinopril-hydrochlorothiazide (ZESTORETIC) 20-12.5 MG tablet    Sig: Take 1 tablet by mouth daily.    Dispense:  90 tablet    Refill:  1  . cloNIDine (CATAPRES) 0.1 MG tablet    Sig: Take 1 tablet (0.1 mg total) by mouth at bedtime. For hotflashes    Dispense:  90 tablet    Refill:  1  . atorvastatin (LIPITOR) 20 MG tablet    Sig: Take 1 tablet (20 mg total) by mouth daily.    Dispense:  90 tablet    Refill:  1    Follow-up: Return in about 2 weeks (around 10/02/2018) for nurse visit H.pylori test. # months PCP f/u.   Charlott Rakes MD

## 2018-09-19 LAB — BASIC METABOLIC PANEL
BUN / CREAT RATIO: 8 — AB (ref 9–23)
BUN: 6 mg/dL (ref 6–24)
CALCIUM: 9.7 mg/dL (ref 8.7–10.2)
CO2: 25 mmol/L (ref 20–29)
CREATININE: 0.78 mg/dL (ref 0.57–1.00)
Chloride: 97 mmol/L (ref 96–106)
GFR calc Af Amer: 103 mL/min/{1.73_m2} (ref >59)
GFR calc non Af Amer: 90 mL/min/{1.73_m2} (ref >59)
GLUCOSE: 83 mg/dL (ref 65–99)
Potassium: 3.5 mmol/L (ref 3.5–5.2)
Sodium: 139 mmol/L (ref 134–144)

## 2018-10-03 ENCOUNTER — Ambulatory Visit: Payer: Self-pay | Attending: Family Medicine

## 2018-10-03 DIAGNOSIS — R140 Abdominal distension (gaseous): Secondary | ICD-10-CM

## 2018-10-04 LAB — H. PYLORI BREATH TEST: H PYLORI BREATH TEST: NEGATIVE

## 2018-10-08 ENCOUNTER — Telehealth: Payer: Self-pay

## 2018-10-08 NOTE — Telephone Encounter (Signed)
Patient was called and voicemail is not set up to leave a message.   It patient call for lab results please inform patient of negatve lab results.

## 2018-10-08 NOTE — Telephone Encounter (Signed)
-----   Message from Charlott Rakes, MD sent at 09/19/2018  9:45 AM EST ----- Labs are normal

## 2018-10-14 MED FILL — ?ATORVASTATIN 20 MG TABLET: 20 | 30 days supply | Qty: 30 | Fill #4

## 2018-10-31 ENCOUNTER — Ambulatory Visit: Payer: Self-pay | Attending: Family Medicine

## 2018-11-17 MED FILL — ?ATORVASTATIN 20 MG TABLET: 20 | 30 days supply | Qty: 30 | Fill #5

## 2018-11-21 ENCOUNTER — Telehealth: Payer: Self-pay | Admitting: Family Medicine

## 2018-11-21 NOTE — Telephone Encounter (Signed)
Spoke with Daughter in-law an was inform that the [place] letter will be mail to them today

## 2018-12-17 MED FILL — traZODone HCL 50 MG TABS: 50 | 90 days supply | Qty: 90 | Fill #1

## 2018-12-17 MED FILL — POTASSIUM CHLORIDE ER 10 ME: 10 | 90 days supply | Qty: 90 | Fill #1

## 2018-12-17 MED FILL — ?ATORVASTATIN 20 MG TABLET: 20 | 30 days supply | Qty: 30 | Fill #6

## 2018-12-17 MED FILL — ?CETIRIZINE HCL 10 MG TABLE: 10 | 30 days supply | Qty: 30 | Fill #1

## 2018-12-22 ENCOUNTER — Ambulatory Visit: Payer: Self-pay | Admitting: Family Medicine

## 2019-01-13 MED FILL — ?CETIRIZINE HCL 10 MG TABLE: 10 | 30 days supply | Qty: 30 | Fill #2

## 2019-01-14 ENCOUNTER — Encounter: Payer: Self-pay | Admitting: Family Medicine

## 2019-01-14 ENCOUNTER — Other Ambulatory Visit: Payer: Self-pay

## 2019-01-14 ENCOUNTER — Ambulatory Visit: Payer: Self-pay | Attending: Family Medicine | Admitting: Family Medicine

## 2019-01-14 DIAGNOSIS — I10 Essential (primary) hypertension: Secondary | ICD-10-CM

## 2019-01-14 DIAGNOSIS — K2970 Gastritis, unspecified, without bleeding: Secondary | ICD-10-CM

## 2019-01-14 DIAGNOSIS — E785 Hyperlipidemia, unspecified: Secondary | ICD-10-CM

## 2019-01-14 DIAGNOSIS — R232 Flushing: Secondary | ICD-10-CM

## 2019-01-14 DIAGNOSIS — E876 Hypokalemia: Secondary | ICD-10-CM

## 2019-01-14 DIAGNOSIS — K2990 Gastroduodenitis, unspecified, without bleeding: Secondary | ICD-10-CM

## 2019-01-14 DIAGNOSIS — G4709 Other insomnia: Secondary | ICD-10-CM

## 2019-01-14 DIAGNOSIS — R0982 Postnasal drip: Secondary | ICD-10-CM

## 2019-01-14 MED ORDER — CLONIDINE HCL 0.1 MG PO TABS: 0.1000 mg | ORAL_TABLET | Freq: Every day | ORAL | 1 refills | Status: DC

## 2019-01-14 MED ORDER — CETIRIZINE HCL 10 MG PO TABS: 10.0000 mg | ORAL_TABLET | Freq: Every day | ORAL | 1 refills | Status: DC

## 2019-01-14 MED ORDER — PANTOPRAZOLE SODIUM 40 MG PO TBEC: 40.0000 mg | DELAYED_RELEASE_TABLET | Freq: Every day | ORAL | 1 refills | Status: DC

## 2019-01-14 MED ORDER — TRAZODONE HCL 50 MG PO TABS: 50.0000 mg | ORAL_TABLET | Freq: Every evening | ORAL | 1 refills | Status: DC | PRN

## 2019-01-14 MED ORDER — POTASSIUM CHLORIDE CRYS ER 10 MEQ PO TBCR: 10.0000 meq | EXTENDED_RELEASE_TABLET | Freq: Every day | ORAL | 1 refills | Status: DC

## 2019-01-14 MED ORDER — LISINOPRIL-HYDROCHLOROTHIAZIDE 20-12.5 MG PO TABS: 1.0000 | ORAL_TABLET | Freq: Every day | ORAL | 1 refills | Status: DC

## 2019-01-14 MED ORDER — ATORVASTATIN CALCIUM 20 MG PO TABS: 20.0000 mg | ORAL_TABLET | Freq: Every day | ORAL | 1 refills | Status: DC

## 2019-01-14 MED FILL — ?ATORVASTATIN 20 MG TABLET: 20 | 30 days supply | Qty: 30 | Fill #0

## 2019-01-14 MED FILL — ?CLONIDINE HCL 0.1 MG TABL: 0.1 | 30 days supply | Qty: 30 | Fill #0

## 2019-01-14 MED FILL — LISINOPRIL-HCTZ 20-12.5 MG: 20-12.5 | 30 days supply | Qty: 30 | Fill #0

## 2019-01-14 MED FILL — ?PANTOPRAZOLE SOD DR 40MG T: 40 | 30 days supply | Qty: 30 | Fill #0

## 2019-01-14 NOTE — Progress Notes (Signed)
Subjective:  Patient ID: Alice Woodard, female    DOB: 05-23-1971  Age: 49 y.o. MRN: [id number]  CC: Hypertension and Abdominal Pain   HPI Alice Woodard is a 48 year old female with a history of hypertension, Achilles tendinitis, dyslipidemia,  peptic ulcer secondary to H. pylori status post cauterization of bleeding vessels who presents today for a follow-up visit. She complains of epigastric pain and further describes this as " feeling hot inside" with associated "tickiling of her body", sweating all over, insomnia. She denies nausea, vomiting, diarrhea or constipation. Tested negative for H. pylori in 09/2018 and is supposed to be on Protonix however review of her medications indicates she has not been taking this. Her blood pressure is elevated and she does not have her antihypertensive in the morning medications with her today and compliance cannot be ascertained. I had also placed her on clonidine for hot flashes which she does not have with her either.  Past Medical History:  Diagnosis Date  . Anemia   . History of blood transfusion 04/17/2017   "anemic"  . Hyperlipidemia   . Hypertension     Past Surgical History:  Procedure Laterality Date  . APPENDECTOMY    . CHOLECYSTECTOMY OPEN    . ESOPHAGOGASTRODUODENOSCOPY N/A 04/18/2017   Procedure: ESOPHAGOGASTRODUODENOSCOPY (EGD);  Surgeon: Mauri Pole, MD;  Location: [facility] ENDOSCOPY;  Service: Endoscopy;  Laterality: N/A;    History reviewed. No pertinent family history.  Allergies  Allergen Reactions  . Amlodipine Rash    Outpatient Medications Prior to Visit  Medication Sig Dispense Refill  . atorvastatin (LIPITOR) 20 MG tablet Take 1 tablet (20 mg total) by mouth daily. 90 tablet 1  . cetirizine (ZYRTEC) 10 MG tablet Take 1 tablet (10 mg total) by mouth daily. 90 tablet 1  . potassium chloride (K-DUR,KLOR-CON) 10 MEQ tablet Take 1 tablet (10 mEq total) by mouth daily. 90 tablet 1  . traZODone (DESYREL) 50 MG  tablet Take 1 tablet (50 mg total) by mouth at bedtime as needed for sleep. 90 tablet 1  . acetaminophen (TYLENOL) 325 MG tablet Take 2 tablets (650 mg total) by mouth every 6 (six) hours as needed for mild pain (or Fever >/= 101). (Patient not taking: Reported on 10/14/2017) 60 tablet 0  . benzonatate (TESSALON) 100 MG capsule Take 1 capsule (100 mg total) by mouth 2 (two) times daily as needed for cough. (Patient not taking: Reported on 01/14/2019) 20 capsule 0  . cloNIDine (CATAPRES) 0.1 MG tablet Take 1 tablet (0.1 mg total) by mouth at bedtime. For hotflashes (Patient not taking: Reported on 01/14/2019) 90 tablet 1  . lisinopril-hydrochlorothiazide (ZESTORETIC) 20-12.5 MG tablet Take 1 tablet by mouth daily. (Patient not taking: Reported on 01/14/2019) 90 tablet 1  . pantoprazole (PROTONIX) 40 MG tablet Take 1 tablet (40 mg total) by mouth daily. (Patient not taking: Reported on 01/14/2019) 90 tablet 1   No facility-administered medications prior to visit.      ROS Review of Systems General: negative for fever, weight loss, appetite change Eyes: no visual symptoms. ENT: no ear symptoms, no sinus tenderness, no nasal congestion or sore throat. Neck: no pain  Respiratory: no wheezing, shortness of breath, cough Cardiovascular: no chest pain, no dyspnea on exertion, no pedal edema, no orthopnea. Gastrointestinal: +abdominal pain, no diarrhea, no constipation Genito-Urinary: no urinary frequency, no dysuria, no polyuria. Hematologic: no bruising Endocrine: no cold or heat intolerance Neurological: no headaches, no seizures, no tremors Musculoskeletal: no joint pains, no joint  swelling Skin: no pruritus, no rash. Psychological: no depression, no anxiety,    Objective:  BP (!) 164/99   Pulse 63   Temp 98.1 F (36.7 C) (Oral)   Ht '4\' 10"'  (1.473 m)   Wt 122 lb 9.6 oz (55.6 kg)   LMP 11/15/2014   SpO2 99%   BMI 25.62 kg/m   BP/Weight 01/14/2019 04/15/159 1/0/9323  Systolic BP 557 322 025    Diastolic BP 99 90 92  Wt. (Lbs) 122.6 125.6 120.2  BMI 25.62 26.25 25.12      Physical Exam Constitutional: normal appearing,  Eyes: PERRLA HEENT: Head is atraumatic, normal sinuses, normal oropharynx, normal appearing tonsils and palate, tympanic membrane is normal bilaterally. Neck: normal range of motion, no thyromegaly, no JVD Cardiovascular: normal rate and rhythm, normal heart sounds, no murmurs, rub or gallop, no pedal edema Respiratory: Normal breath sounds, clear to auscultation bilaterally, no wheezes, no rales, no rhonchi Abdomen: soft, epigastric region slightly tender to palpation, normal bowel sounds, no enlarged organs Musculoskeletal: Full ROM, no tenderness in joints Skin: warm and dry, no lesions. Neurological: alert, oriented x3, cranial nerves I-XII grossly intact , normal motor strength, normal sensation. Psychological: normal mood.   CMP Latest Ref Rng & Units 09/18/2018 03/07/2018 12/07/2017  Glucose 65 - 99 mg/dL 83 80 125(H)  BUN 6 - 24 mg/dL 6 5(L) 6  Creatinine 0.57 - 1.00 mg/dL 0.78 0.65 0.71  Sodium 134 - 144 mmol/L 139 144 140  Potassium 3.5 - 5.2 mmol/L 3.5 2.9(L) 3.5  Chloride 96 - 106 mmol/L 97 104 105  CO2 20 - 29 mmol/L '25 24 25  ' Calcium 8.7 - 10.2 mg/dL 9.7 9.4 9.4  Total Protein 6.0 - 8.5 g/dL - 7.2 7.8  Total Bilirubin 0.0 - 1.2 mg/dL - 1.0 1.2  Alkaline Phos 39 - 117 IU/L - 170(H) 152(H)  AST 0 - 40 IU/L - 23 43(H)  ALT 0 - 32 IU/L - 11 22    Lipid Panel     Component Value Date/Time   CHOL 139 03/07/2018 0919   TRIG 164 (H) 03/07/2018 0919   HDL 39 (L) 03/07/2018 0919   CHOLHDL 3.6 03/07/2018 0919   CHOLHDL 4.8 10/26/2016 0933   VLDL 22 10/26/2016 0933   LDLCALC 67 03/07/2018 0919    CBC    Component Value Date/Time   WBC 8.0 12/07/2017 1154   RBC 4.65 12/07/2017 1154   HGB 13.9 12/07/2017 1154   HGB 11.2 05/30/2017 0903   HCT 39.9 12/07/2017 1154   HCT 34.6 05/30/2017 0903   PLT 289 12/07/2017 1154   PLT 329  05/30/2017 0903   MCV 85.8 12/07/2017 1154   MCV 91 05/30/2017 0903   MCH 29.9 12/07/2017 1154   MCHC 34.8 12/07/2017 1154   RDW 13.5 12/07/2017 1154   RDW 14.0 05/30/2017 0903   LYMPHSABS 1.9 05/30/2017 0903   MONOABS 0.6 12/29/2015 1126   EOSABS 0.2 05/30/2017 0903   BASOSABS 0.0 05/30/2017 4270    Lab Results  Component Value Date   HGBA1C 5.0 12/29/2015    Assessment & Plan:   1. Dyslipidemia Controlled Low-cholesterol diet - atorvastatin (LIPITOR) 20 MG tablet; Take 1 tablet (20 mg total) by mouth daily.  Dispense: 90 tablet; Refill: 1  2. Hot flashes Uncontrolled she has not been taking clonidine - cloNIDine (CATAPRES) 0.1 MG tablet; Take 1 tablet (0.1 mg total) by mouth at bedtime. For hotflashes  Dispense: 90 tablet; Refill: 1  3. Gastritis and gastroduodenitis  Uncontrolled It appears she has not been taking Protonix either H. pylori breath test and advised to resume Protonix - pantoprazole (PROTONIX) 40 MG tablet; Take 1 tablet (40 mg total) by mouth daily.  Dispense: 90 tablet; Refill: 1 - H. pylori breath test  4. Other insomnia Could be secondary to her hot flashes Hopefully resumption of clonidine will be beneficial - traZODone (DESYREL) 50 MG tablet; Take 1 tablet (50 mg total) by mouth at bedtime as needed for sleep.  Dispense: 90 tablet; Refill: 1  5. Essential hypertension, benign Uncontrolled Not compliant with lisinopril/hydrochlorothiazide Resume medication Counseled on blood pressure goal of less than 130/80, low-sodium, DASH diet, medication compliance, 150 minutes of moderate intensity exercise per week. Discussed medication compliance, adverse effects.  lisinopril-hydrochlorothiazide (ZESTORETIC) 20-12.5 MG tablet; Take 1 tablet by mouth daily.  Dispense: 90 tablet; Refill: 1 - CMP14+EGFR; Future - Lipid panel; Future  6. Post-nasal drip - cetirizine (ZYRTEC) 10 MG tablet; Take 1 tablet (10 mg total) by mouth daily.  Dispense: 90 tablet;  Refill: 1  7. Hypokalemia - potassium chloride (K-DUR,KLOR-CON) 10 MEQ tablet; Take 1 tablet (10 mEq total) by mouth daily.  Dispense: 90 tablet; Refill: 1   Meds ordered this encounter  Medications  . atorvastatin (LIPITOR) 20 MG tablet    Sig: Take 1 tablet (20 mg total) by mouth daily.    Dispense:  90 tablet    Refill:  1  . cloNIDine (CATAPRES) 0.1 MG tablet    Sig: Take 1 tablet (0.1 mg total) by mouth at bedtime. For hotflashes    Dispense:  90 tablet    Refill:  1  . pantoprazole (PROTONIX) 40 MG tablet    Sig: Take 1 tablet (40 mg total) by mouth daily.    Dispense:  90 tablet    Refill:  1  . traZODone (DESYREL) 50 MG tablet    Sig: Take 1 tablet (50 mg total) by mouth at bedtime as needed for sleep.    Dispense:  90 tablet    Refill:  1  . lisinopril-hydrochlorothiazide (ZESTORETIC) 20-12.5 MG tablet    Sig: Take 1 tablet by mouth daily.    Dispense:  90 tablet    Refill:  1  . cetirizine (ZYRTEC) 10 MG tablet    Sig: Take 1 tablet (10 mg total) by mouth daily.    Dispense:  90 tablet    Refill:  1  . potassium chloride (K-DUR,KLOR-CON) 10 MEQ tablet    Sig: Take 1 tablet (10 mEq total) by mouth daily.    Dispense:  90 tablet    Refill:  1    Follow-up: Return in about 3 months (around 04/16/2019) for follow up of chronic medical conditions.       Charlott Rakes, MD, FAAFP. [facility] and [place], [place]   01/14/2019, 3:29 PM

## 2019-01-14 NOTE — Progress Notes (Signed)
C/C: burning sensation in abdomen.

## 2019-01-16 ENCOUNTER — Telehealth: Payer: Self-pay

## 2019-01-16 ENCOUNTER — Other Ambulatory Visit: Payer: Self-pay | Admitting: Family Medicine

## 2019-01-16 DIAGNOSIS — K2970 Gastritis, unspecified, without bleeding: Secondary | ICD-10-CM

## 2019-01-16 DIAGNOSIS — K2990 Gastroduodenitis, unspecified, without bleeding: Principal | ICD-10-CM

## 2019-01-16 LAB — H. PYLORI BREATH TEST: H PYLORI BREATH TEST: NEGATIVE

## 2019-01-16 NOTE — Telephone Encounter (Signed)
-----   Message from Charlott Rakes, MD sent at 01/16/2019 10:17 AM EST ----- H. pylori is negative.  I have sent a referral to GI to evaluate her abdominal pain

## 2019-01-16 NOTE — Telephone Encounter (Signed)
Patient was called and informed of lab results via interpreter. 

## 2019-01-19 ENCOUNTER — Ambulatory Visit: Payer: Self-pay | Attending: Family Medicine

## 2019-01-19 DIAGNOSIS — I10 Essential (primary) hypertension: Secondary | ICD-10-CM

## 2019-01-20 LAB — CMP14+EGFR
ALT: 14 IU/L (ref 0–32)
AST: 20 IU/L (ref 0–40)
Albumin/Globulin Ratio: 1.5 (ref 1.2–2.2)
Albumin: 4.4 g/dL (ref 3.8–4.8)
Alkaline Phosphatase: 182 IU/L — ABNORMAL HIGH (ref 39–117)
BILIRUBIN TOTAL: 0.8 mg/dL (ref 0.0–1.2)
BUN/Creatinine Ratio: 12 (ref 9–23)
BUN: 9 mg/dL (ref 6–24)
CHLORIDE: 101 mmol/L (ref 96–106)
CO2: 27 mmol/L (ref 20–29)
Calcium: 9.5 mg/dL (ref 8.7–10.2)
Creatinine, Ser: 0.78 mg/dL (ref 0.57–1.00)
GFR calc non Af Amer: 90 mL/min/{1.73_m2} (ref >59)
GFR, EST AFRICAN AMERICAN: 103 mL/min/{1.73_m2} (ref >59)
GLUCOSE: 73 mg/dL (ref 65–99)
Globulin, Total: 2.9 g/dL (ref 1.5–4.5)
Potassium: 4 mmol/L (ref 3.5–5.2)
Sodium: 144 mmol/L (ref 134–144)
Total Protein: 7.3 g/dL (ref 6.0–8.5)

## 2019-01-20 LAB — LIPID PANEL
Chol/HDL Ratio: 4.3 ratio (ref 0.0–4.4)
Cholesterol, Total: 152 mg/dL (ref 100–199)
HDL: 35 mg/dL — AB (ref >39)
LDL Calculated: 72 mg/dL (ref 0–99)
TRIGLYCERIDES: 226 mg/dL — AB (ref 0–149)
VLDL Cholesterol Cal: 45 mg/dL — ABNORMAL HIGH (ref 5–40)

## 2019-01-30 ENCOUNTER — Telehealth: Payer: Self-pay

## 2019-01-30 NOTE — Telephone Encounter (Signed)
Patient was called and a voicemail was left informing patient to return phone call for lab results. 

## 2019-01-30 NOTE — Telephone Encounter (Signed)
-----   Message from Charlott Rakes, MD sent at 01/20/2019  8:30 AM EDT ----- Labs reveal normal total cholesterol however triglycerides which is a type of cholesterol is elevated.  Please advised to commence OTC fish oil capsules.  One of her liver enzymes is also elevated.  I had referred her to GI and she should be receiving an appointment soon.

## 2019-02-12 MED FILL — LISINOPRIL-HCTZ 20-12.5 MG: 20-12.5 | 30 days supply | Qty: 30 | Fill #1

## 2019-02-12 MED FILL — ?CETIRIZINE HCL 10 MG TABLE: 10 | 30 days supply | Qty: 30 | Fill #3

## 2019-02-12 MED FILL — ?PANTOPRAZOLE SOD DR 40MGTA: 40 | 30 days supply | Qty: 30 | Fill #1

## 2019-02-12 MED FILL — ?ATORVASTATIN 20 MG TABLET: 20 | 30 days supply | Qty: 30 | Fill #1

## 2019-02-12 MED FILL — ?CLONIDINE HCL 0.1 MG TABL: 0.1 | 30 days supply | Qty: 30 | Fill #1

## 2019-02-25 ENCOUNTER — Other Ambulatory Visit: Payer: Self-pay

## 2019-02-25 ENCOUNTER — Ambulatory Visit (INDEPENDENT_AMBULATORY_CARE_PROVIDER_SITE_OTHER): Payer: Self-pay | Admitting: Gastroenterology

## 2019-02-25 ENCOUNTER — Encounter: Payer: Self-pay | Admitting: Gastroenterology

## 2019-02-25 VITALS — Ht <= 58 in | Wt 122.0 lb

## 2019-02-25 DIAGNOSIS — Z1212 Encounter for screening for malignant neoplasm of rectum: Secondary | ICD-10-CM

## 2019-02-25 DIAGNOSIS — K2970 Gastritis, unspecified, without bleeding: Secondary | ICD-10-CM

## 2019-02-25 DIAGNOSIS — Z8619 Personal history of other infectious and parasitic diseases: Secondary | ICD-10-CM

## 2019-02-25 DIAGNOSIS — Z1211 Encounter for screening for malignant neoplasm of colon: Secondary | ICD-10-CM

## 2019-02-25 DIAGNOSIS — K2990 Gastroduodenitis, unspecified, without bleeding: Secondary | ICD-10-CM

## 2019-02-25 DIAGNOSIS — K581 Irritable bowel syndrome with constipation: Secondary | ICD-10-CM

## 2019-02-25 MED ORDER — PANTOPRAZOLE SODIUM 40 MG PO TBEC: 40.0000 mg | DELAYED_RELEASE_TABLET | Freq: Every day | ORAL | 3 refills | Status: DC

## 2019-02-25 MED ORDER — DICYCLOMINE HCL 10 MG PO CAPS: 10.0000 mg | ORAL_CAPSULE | Freq: Three times a day (TID) | ORAL | 3 refills | Status: DC | PRN

## 2019-02-25 MED FILL — DICYCLOMINE 10 MG CAPSULE: 10 | 30 days supply | Qty: 90 | Fill #0

## 2019-02-25 NOTE — Progress Notes (Signed)
Alice Woodard    [id number]    Dec 25, 1970  Primary Care Physician:Newlin, Charlane Ferretti, MD  Referring Physician: Charlott Rakes, MD [address] [place], [place] [address]  This service was provided via audio and video telemedicine (webex) due to [place] pandemic.  Patient location: Home Provider location: Office Used 2 patient identifiers to confirm the correct person. Explained the limitations in evaluation and management via telemedicine. Patient is aware of potential medical charges for this visit.  Patient consented to this virtual visit.  The persons participating in this telemedicine service were myself and the patient    Chief complaint: Constipation, epigastric discomfort  HPI: 48 year old female with history of hospitalization with GI hemorrhage due to duodenal ulcers in June 2018 secondary to H. pylori gastroduodenitis and NSAID use.  EGD 04/18/2017: Showed multiple duodenal ulcers, H. pylori positive.  She was treated with antibiotics for H. pylori  H.pylori stool Antigen negative 06/26/2017, confirmed eradication.  She continues to have intermittent epigastric and left upper quadrant discomfort, not as severe as she had prior to treatment of H. pylori.  Denies any relationship with diet or activity.  No dysphagia, odynophagia, nausea, vomiting, melena or blood per rectum. She has irregular bowel habits, somewhat better since she started taking oral potassium chloride tablet.  She is having bowel movement almost daily. No loss of appetite or weight loss.     Outpatient Encounter Medications as of 02/25/2019  Medication Sig  . acetaminophen (TYLENOL) 325 MG tablet Take 2 tablets (650 mg total) by mouth every 6 (six) hours as needed for mild pain (or Fever >/= 101).  Marland Kitchen atorvastatin (LIPITOR) 20 MG tablet Take 1 tablet (20 mg total) by mouth daily.  . cetirizine (ZYRTEC) 10 MG tablet Take 1 tablet (10 mg total) by mouth daily.  . cloNIDine (CATAPRES)  0.1 MG tablet Take 1 tablet (0.1 mg total) by mouth at bedtime. For hotflashes  . lisinopril-hydrochlorothiazide (ZESTORETIC) 20-12.5 MG tablet Take 1 tablet by mouth daily.  . pantoprazole (PROTONIX) 40 MG tablet Take 1 tablet (40 mg total) by mouth daily.  . potassium chloride (K-DUR,KLOR-CON) 10 MEQ tablet Take 1 tablet (10 mEq total) by mouth daily.  . traZODone (DESYREL) 50 MG tablet Take 1 tablet (50 mg total) by mouth at bedtime as needed for sleep.   No facility-administered encounter medications on file as of 02/25/2019.     Allergies as of 02/25/2019 - Review Complete 02/25/2019  Allergen Reaction Noted  . Amlodipine Rash 04/17/2017    Past Medical History:  Diagnosis Date  . Anemia   . History of blood transfusion 04/17/2017   "anemic"  . Hyperlipidemia   . Hypertension     Past Surgical History:  Procedure Laterality Date  . APPENDECTOMY    . CHOLECYSTECTOMY OPEN    . ESOPHAGOGASTRODUODENOSCOPY N/A 04/18/2017   Procedure: ESOPHAGOGASTRODUODENOSCOPY (EGD);  Surgeon: Mauri Pole, MD;  Location[facility] ENDOSCOPY;  Service: Endoscopy;  Laterality: N/A;    History reviewed. No pertinent family history.  Social History   Socioeconomic History  . Marital status: Single    Spouse name: Not on file  . Number of children: 2  . Years of education: Not on file  . Highest education level: Not on file  Occupational History  . Not on file  Social Needs  . Financial resource strain: Not on file  . Food insecurity:    Worry: Not on file    Inability: Not on  file  . Transportation needs:    Medical: Not on file    Non-medical: Not on file  Tobacco Use  . Smoking status: Never Smoker  . Smokeless tobacco: Never Used  Substance and Sexual Activity  . Alcohol use: No  . Drug use: No  . Sexual activity: Not on file  Lifestyle  . Physical activity:    Days per week: Not on file    Minutes per session: Not on file  . Stress: Not on file  Relationships  . Social  connections:    Talks on phone: Not on file    Gets together: Not on file    Attends religious service: Not on file    Active member of club or organization: Not on file    Attends meetings of clubs or organizations: Not on file    Relationship status: Not on file  . Intimate partner violence:    Fear of current or ex partner: Not on file    Emotionally abused: Not on file    Physically abused: Not on file    Forced sexual activity: Not on file  Other Topics Concern  . Not on file  Social History Narrative  . Not on file      Review of systems: Review of Systems as per HPI All other systems reviewed and are negative.   Physical Exam: Vitals were not taken and physical exam was not performed during this virtual visit.  Data Reviewed:  Reviewed labs, radiology imaging, old records and pertinent past GI work up   Assessment and Plan/Recommendations: 19 yr F with h/o gastroduodenal ulcers secondary to H.pylori and NSAID use, s/p H.pylori eradication with intermittent epigastric and LUQ abd pain, chronic constipation and IBS Protonix 40mg  daily Antireflux measures  Start dicyclomine 10 mg 3 times daily as needed for abdominal discomfort and cramping/IBS symptoms  Increase dietary fiber and fluid intake to improve constipation Benefiber 1 teaspoon 2-3 times daily with meals  Due for colorectal cancer screening, will schedule colonoscopy once we start doing elective procedures, currently on hold due to coronavirus pandemic.  Follow-up in office visit in 1 year or sooner if needed      K. Denzil Magnuson , MD   CC: Charlott Rakes, MD

## 2019-02-25 NOTE — Patient Instructions (Addendum)
Protonix 40 mg daily, 30 minutes before breakfast.  Please send 90-day supply with refill for a year.  Antireflux measures  Dicyclomine 10 mg up to 3 times daily as needed for abdominal discomfort and cramping.  Please send 90-day supply with refill for a year  Start Benefiber 1 teaspoon 2-3 times daily, mix in 8 ounces liquid  Constipation handout   Due for colorectal cancer screening, will schedule colonoscopy once we start doing elective procedures at [facility].  Currently elective procedures on hold due to coronavirus pandemic.  Return in 1 year for office follow-up visit  I appreciate the opportunity to care for you.

## 2019-03-25 MED FILL — LISINOPRIL-HCTZ 20-12.5 MG: 20-12.5 | 30 days supply | Qty: 30 | Fill #2

## 2019-03-25 MED FILL — ?PANTOPRAZOLE SOD DR 40MGTA: 40 | 90 days supply | Qty: 90 | Fill #2

## 2019-03-25 MED FILL — ?ATORVASTATIN 20 MG TABLET: 20 | 90 days supply | Qty: 90 | Fill #2

## 2019-03-25 MED FILL — ?CLONIDINE HCL 0.1 MG TABL: 0.1 | 90 days supply | Qty: 90 | Fill #2

## 2019-04-20 ENCOUNTER — Ambulatory Visit: Payer: Self-pay | Attending: Family Medicine | Admitting: Family Medicine

## 2019-04-20 ENCOUNTER — Other Ambulatory Visit: Payer: Self-pay

## 2019-04-20 ENCOUNTER — Encounter: Payer: Self-pay | Admitting: Family Medicine

## 2019-04-20 DIAGNOSIS — Z1211 Encounter for screening for malignant neoplasm of colon: Secondary | ICD-10-CM

## 2019-04-20 DIAGNOSIS — K2970 Gastritis, unspecified, without bleeding: Secondary | ICD-10-CM

## 2019-04-20 DIAGNOSIS — Z1239 Encounter for other screening for malignant neoplasm of breast: Secondary | ICD-10-CM

## 2019-04-20 DIAGNOSIS — K2990 Gastroduodenitis, unspecified, without bleeding: Secondary | ICD-10-CM

## 2019-04-20 DIAGNOSIS — R059 Cough, unspecified: Secondary | ICD-10-CM

## 2019-04-20 DIAGNOSIS — R05 Cough: Secondary | ICD-10-CM

## 2019-04-20 MED ORDER — PANTOPRAZOLE SODIUM 40 MG PO TBEC: 40.0000 mg | DELAYED_RELEASE_TABLET | Freq: Every day | ORAL | 3 refills | Status: DC

## 2019-04-20 MED ORDER — BENZONATATE 100 MG PO CAPS: 100.0000 mg | ORAL_CAPSULE | Freq: Three times a day (TID) | ORAL | 0 refills | Status: DC

## 2019-04-20 MED ORDER — PANTOPRAZOLE SODIUM 40 MG PO TBEC: 40.0000 mg | DELAYED_RELEASE_TABLET | Freq: Two times a day (BID) | ORAL | 3 refills | Status: DC

## 2019-04-20 MED FILL — ?PANTOPRAZOLE SO DR 40MG TA: 40 | 30 days supply | Qty: 60 | Fill #0

## 2019-04-20 MED FILL — BENZONATATE 100 MG CAPS: 100 | 10 days supply | Qty: 30 | Fill #0

## 2019-04-20 NOTE — Progress Notes (Signed)
Virtual Visit via Telephone Note  I connected with Alice Woodard, on 04/20/2019 at 4:01 PM by telephone due to the COVID-19 pandemic and verified that I am speaking with the correct person using two identifiers.   Consent: I discussed the limitations, risks, security and privacy concerns of performing an evaluation and management service by telephone and the availability of in person appointments. I also discussed with the patient that there may be a patient responsible charge related to this service. The patient expressed understanding and agreed to proceed.   Location of Patient: Patient's home  Location of Provider: Clinic   Persons participating in Telemedicine visit: Megha Olive Zmuda Dr. Merwyn Katos ID [phone]     History of Present Illness: Alice Woodard is a 48 year old female with a history of hypertension, Achilles tendinitis, dyslipidemia,  peptic ulcer secondary to H. pylori status post cauterization of bleeding vessels who presents today for a follow-up visit.  She complains of cough which is worse at night when she goes to sleep and this interrupts her sleep.  This is associated with residue in her mouth of a salty taste.  She is currently on Protonix which was prescribed by GI and she endorses compliance with it.  Her last meal of the day is about 2 to 3 hours prior to bedtime.  She denies odynophagia, abdominal pain, dyspepsia.  She also thinks she has a swelling under her chin which has been present for the last couple of months she states. During the previous visit I had placed her on Zyrtec which she has been compliant with.  This was initially beneficial but was for short while. She had a visit with GI on 02/25/2019.   Past Medical History:  Diagnosis Date  . Anemia   . History of blood transfusion 04/17/2017   "anemic"  . Hyperlipidemia   . Hypertension    Allergies  Allergen Reactions  . Amlodipine Rash    Current Outpatient  Medications on File Prior to Visit  Medication Sig Dispense Refill  . acetaminophen (TYLENOL) 325 MG tablet Take 2 tablets (650 mg total) by mouth every 6 (six) hours as needed for mild pain (or Fever >/= 101). 60 tablet 0  . atorvastatin (LIPITOR) 20 MG tablet Take 1 tablet (20 mg total) by mouth daily. 90 tablet 1  . cetirizine (ZYRTEC) 10 MG tablet Take 1 tablet (10 mg total) by mouth daily. 90 tablet 1  . cloNIDine (CATAPRES) 0.1 MG tablet Take 1 tablet (0.1 mg total) by mouth at bedtime. For hotflashes 90 tablet 1  . dicyclomine (BENTYL) 10 MG capsule Take 1 capsule (10 mg total) by mouth 3 (three) times daily as needed (abdominal discomfort and cramping). 270 capsule 3  . lisinopril-hydrochlorothiazide (ZESTORETIC) 20-12.5 MG tablet Take 1 tablet by mouth daily. 90 tablet 1  . pantoprazole (PROTONIX) 40 MG tablet Take 1 tablet (40 mg total) by mouth daily before breakfast. Please take 30 minutes before breakfast 90 tablet 3  . potassium chloride (K-DUR,KLOR-CON) 10 MEQ tablet Take 1 tablet (10 mEq total) by mouth daily. 90 tablet 1  . traZODone (DESYREL) 50 MG tablet Take 1 tablet (50 mg total) by mouth at bedtime as needed for sleep. (Patient not taking: Reported on 04/20/2019) 90 tablet 1   No current facility-administered medications on file prior to visit.     Observations/Objective: Awake, alert, oriented x3 Not in acute distress  Assessment and Plan: 1. Gastritis and gastroduodenitis Increase Protonix to twice daily dosing given  possible reflux symptoms Advised against late night meals and to avoid recumbency up to 2 hours post meals - pantoprazole (PROTONIX) 40 MG tablet; Take 1 tablet (40 mg total) by mouth twice daily.  Dispense: 60 tablet; Refill: 3  2. Screening for breast cancer - MM Digital Screening; Future  3. Screening for colon cancer - Ambulatory referral to Gastroenterology  4. Cough Continue Zyrtec, will add on Tessalon Perles - benzonatate (TESSALON) 100 MG  capsule; Take 1 capsule (100 mg total) by mouth 3 (three) times daily.  Dispense: 30 capsule; Refill: 0   Follow Up Instructions: 2 weeks and person-to evaluate chin concerns   I discussed the assessment and treatment plan with the patient. The patient was provided an opportunity to ask questions and all were answered. The patient agreed with the plan and demonstrated an understanding of the instructions.   The patient was advised to call back or seek an in-person evaluation if the symptoms worsen or if the condition fails to improve as anticipated.     I provided 26 minutes total of non-face-to-face time during this encounter including median intraservice time, reviewing previous notes, labs, imaging, medications, management and patient verbalized understanding.     Charlott Rakes, MD, FAAFP. [facility] and [place], [place]   04/20/2019, 4:01 PM

## 2019-04-20 NOTE — Progress Notes (Signed)
Patient has been called and DOB has been verified. Patient has been screened and transferred to PCP to start phone visit.   Patient states that she has coughing and sore throat at night and its hard for her to sleep.

## 2019-04-21 ENCOUNTER — Encounter: Payer: Self-pay | Admitting: Gastroenterology

## 2019-05-06 ENCOUNTER — Ambulatory Visit: Payer: Self-pay | Admitting: *Deleted

## 2019-05-06 ENCOUNTER — Other Ambulatory Visit: Payer: Self-pay

## 2019-05-06 VITALS — Ht <= 58 in | Wt 122.0 lb

## 2019-05-06 DIAGNOSIS — Z1211 Encounter for screening for malignant neoplasm of colon: Secondary | ICD-10-CM

## 2019-05-06 MED ORDER — PLENVU 140 G PO SOLR: 1.0000 | Freq: Once | ORAL | 0 refills | Status: AC

## 2019-05-06 NOTE — Progress Notes (Signed)
Patient's pre-visit was done today over the phone with the patient and her daughter (interpreter) due to COVID-19 pandemic. Name,DOB and address verified. Insurance verified. Patient states no changes in medical, surgical history or medications since GI OV with Dr.Nandigam. Packet of Prep instructions mailed to patient including copy of a consent form-pt is aware. Plenvu sample at friont desk 3rd floor for daughter to pick up. Patient understands to call us back with any questions or concerns.  Patient denies any allergies to eggs or soy. Patient denies any problems with anesthesia/sedation. Patient denies any oxygen use at home. Patient denies taking any diet/weight loss medications or blood thinners. EMMI education assisgned to patient on colonoscopy, this was explained and instructions given to patient. Pt is aware that care partner will wait in the car during parking lot; if they feel like they will be too hot to wait in the car; they may wait in the lobby.  We want them to wear a mask (we do not have any that we can provide them), practice social distancing, and we will check their temperatures when they get here.  I did remind patient that their care partner needs to stay in the parking lot the entire time. Pt will wear mask into building

## 2019-05-12 ENCOUNTER — Other Ambulatory Visit: Payer: Self-pay

## 2019-05-12 ENCOUNTER — Encounter: Payer: Self-pay | Admitting: Family Medicine

## 2019-05-12 ENCOUNTER — Ambulatory Visit: Payer: Self-pay | Attending: Family Medicine | Admitting: Family Medicine

## 2019-05-12 VITALS — BP 158/85 | HR 60 | Temp 98.4°F | Ht <= 58 in | Wt 123.6 lb

## 2019-05-12 DIAGNOSIS — B9681 Helicobacter pylori [H. pylori] as the cause of diseases classified elsewhere: Secondary | ICD-10-CM

## 2019-05-12 DIAGNOSIS — K269 Duodenal ulcer, unspecified as acute or chronic, without hemorrhage or perforation: Secondary | ICD-10-CM

## 2019-05-12 DIAGNOSIS — R221 Localized swelling, mass and lump, neck: Secondary | ICD-10-CM

## 2019-05-12 DIAGNOSIS — K120 Recurrent oral aphthae: Secondary | ICD-10-CM

## 2019-05-12 MED ORDER — LIDOCAINE VISCOUS HCL 2 % MT SOLN: 15.0000 mL | Freq: Four times a day (QID) | OROMUCOSAL | 0 refills | Status: DC | PRN

## 2019-05-12 MED FILL — LIDOCAINE 2% VISCOUS SOLN: 2 | 2 days supply | Qty: 100 | Fill #0

## 2019-05-12 NOTE — Patient Instructions (Signed)
Peptic Ulcer ° °A peptic ulcer is a painful sore in the lining of your stomach or the first part of your small intestine. °What are the causes? °Common causes of this condition include: °· An infection. °· Using certain pain medicines too often or too much. °What increases the risk? °You are more likely to get this condition if you: °· Smoke. °· Have a family history of ulcer disease. °· Drink alcohol. °· Have been hospitalized in an intensive care unit (ICU). °What are the signs or symptoms? °Symptoms include: °· Burning pain in the area between the chest and the belly button. The pain may: °? Not go away (be persistent). °? Be worse when your stomach is empty. °? Be worse at night. °· Heartburn. °· Feeling sick to your stomach (nauseous) and throwing up (vomiting). °· Bloating. °If the ulcer results in bleeding, it can cause you to: °· Have poop (stool) that is black and looks like tar. °· Throw up bright red blood. °· Throw up material that looks like coffee grounds. °How is this treated? °Treatment for this condition may include: °· Stopping things that can cause the ulcer, such as: °? Smoking. °? Using pain medicines. °· Medicines to reduce stomach acid. °· Antibiotic medicines if the ulcer is caused by an infection. °· A procedure that is done using a small, flexible tube that has a camera at the end (upper endoscopy). This may be done if you have a bleeding ulcer. °· Surgery. This may be needed if: °? You have a lot of bleeding. °? The ulcer caused a hole somewhere in the digestive system. °Follow these instructions at home: °· Do not drink alcohol if your doctor tells you not to drink. °· Limit how much caffeine you take in. °· Do not use any products that contain nicotine or tobacco, such as cigarettes, e-cigarettes, and chewing tobacco. If you need help quitting, ask your doctor. °· Take over-the-counter and prescription medicines only as told by your doctor. °? Do not stop or change your medicines unless  you talk with your doctor about it first. °? Do not take aspirin, ibuprofen, or other NSAIDs unless your doctor told you to do so. °· Keep all follow-up visits as told by your doctor. This is important. °Contact a doctor if: °· You do not get better in 7 days after you start treatment. °· You keep having an upset stomach (indigestion) or heartburn. °Get help right away if: °· You have sudden, sharp pain in your belly (abdomen). °· You have belly pain that does not go away. °· You have bloody poop (stool) or black, tarry poop. °· You throw up blood. It may look like coffee grounds. °· You feel light-headed or feel like you may pass out (faint). °· You get weak. °· You get sweaty or feel sticky and cold to the touch (clammy). °Summary °· Symptoms of a peptic ulcer include burning pain in the area between the chest and the belly button. °· Take medicines only as told by your doctor. °· Limit how much alcohol and caffeine you have. °· Keep all follow-up visits as told by your doctor. °This information is not intended to replace advice given to you by your health care provider. Make sure you discuss any questions you have with your health care provider. °Document Released: 01/23/2010 Document Revised: 05/06/2018 Document Reviewed: 05/06/2018 °Elsevier Patient Education © 2020 [employer] ° °

## 2019-05-12 NOTE — Progress Notes (Signed)
Patient is having pain in heels and abdomen.  Patient has sore on tongue.

## 2019-05-12 NOTE — Progress Notes (Signed)
Subjective:  Patient ID: Alice Woodard, female    DOB: 1971-01-27  Age: 48 y.o. MRN: [id number]  CC: Abdominal Pain   HPI Alice Woodard is a 48 year old female with a history of hypertension, Achilles tendinitis, dyslipidemia,  peptic ulcer secondary to H. pylori status post cauterization of bleeding vessels who presents today for a follow-up visit.  Complains of sores on the side of her tongue which have been present for several weeks but is improving.  She denies soreness of her teeth or gums.  Also complains of anterior neck swelling which she has noticed over the last couple of months but denies dysphagia, voice change.  She complains swelling extends anteriorly to her chin and on both sides of her neck towards her ear.  Denies fever, myalgias, sore throat. Her reflux symptoms have been uncontrolled with associated epigastric pain despite using Protonix.  She was previously seen by [facility] whom she has not followed up with lately.  Past Medical History:  Diagnosis Date  . Anemia   . History of blood transfusion 04/17/2017   "anemic"  . Hyperlipidemia   . Hypertension     Past Surgical History:  Procedure Laterality Date  . APPENDECTOMY    . CHOLECYSTECTOMY OPEN    . ESOPHAGOGASTRODUODENOSCOPY N/A 04/18/2017   Procedure: ESOPHAGOGASTRODUODENOSCOPY (EGD);  Surgeon: Mauri Pole, MD;  Location: [facility] ENDOSCOPY;  Service: Endoscopy;  Laterality: N/A;    Family History  Problem Relation Age of Onset  . Cancer - Other Mother        blood  . Colon cancer Neg Hx   . Esophageal cancer Neg Hx   . Rectal cancer Neg Hx   . Stomach cancer Neg Hx     Allergies  Allergen Reactions  . Amlodipine Rash    Outpatient Medications Prior to Visit  Medication Sig Dispense Refill  . acetaminophen (TYLENOL) 325 MG tablet Take 2 tablets (650 mg total) by mouth every 6 (six) hours as needed for mild pain (or Fever >/= 101). 60 tablet 0  . atorvastatin (LIPITOR) 20 MG tablet Take 1  tablet (20 mg total) by mouth daily. 90 tablet 1  . benzonatate (TESSALON) 100 MG capsule Take 1 capsule (100 mg total) by mouth 3 (three) times daily. 30 capsule 0  . cetirizine (ZYRTEC) 10 MG tablet Take 1 tablet (10 mg total) by mouth daily. 90 tablet 1  . cloNIDine (CATAPRES) 0.1 MG tablet Take 1 tablet (0.1 mg total) by mouth at bedtime. For hotflashes 90 tablet 1  . dicyclomine (BENTYL) 10 MG capsule Take 1 capsule (10 mg total) by mouth 3 (three) times daily as needed (abdominal discomfort and cramping). 270 capsule 3  . lisinopril-hydrochlorothiazide (ZESTORETIC) 20-12.5 MG tablet Take 1 tablet by mouth daily. 90 tablet 1  . pantoprazole (PROTONIX) 40 MG tablet Take 1 tablet (40 mg total) by mouth 2 (two) times daily. 60 tablet 3  . potassium chloride (K-DUR,KLOR-CON) 10 MEQ tablet Take 1 tablet (10 mEq total) by mouth daily. 90 tablet 1  . traZODone (DESYREL) 50 MG tablet Take 1 tablet (50 mg total) by mouth at bedtime as needed for sleep. 90 tablet 1   No facility-administered medications prior to visit.      ROS Review of Systems  Constitutional: Negative for activity change, appetite change and fatigue.  HENT: Negative for congestion, sinus pressure and sore throat.   Eyes: Negative for visual disturbance.  Respiratory: Negative for cough, chest tightness, shortness of breath and  wheezing.   Cardiovascular: Negative for chest pain and palpitations.  Gastrointestinal: Positive for abdominal pain. Negative for abdominal distention and constipation.  Endocrine: Negative for polydipsia.  Genitourinary: Negative for dysuria and frequency.  Musculoskeletal: Negative for arthralgias and back pain.  Skin: Negative for rash.  Neurological: Negative for tremors, light-headedness and numbness.  Hematological: Does not bruise/bleed easily.  Psychiatric/Behavioral: Negative for agitation and behavioral problems.    Objective:  BP (!) 158/85   Pulse 60   Temp 98.4 F (36.9 C) (Oral)    Ht 4\' 10"  (1.473 m)   Wt 123 lb 9.6 oz (56.1 kg)   LMP 11/15/2014   SpO2 99%   BMI 25.83 kg/m   BP/Weight 05/12/2019 05/06/2019 08/05/2682  Systolic BP 419 - -  Diastolic BP 85 - -  Wt. (Lbs) 123.6 122 122  BMI 25.83 25.5 25.5      Physical Exam Constitutional:      Appearance: She is well-developed.  HENT:     Mouth/Throat:     Comments: Aphthous ulcer on lateral aspect of tongue bilaterally Neck:     Comments: Anterior bilateral neck fullness; no thyromegaly Cardiovascular:     Rate and Rhythm: Normal rate.     Heart sounds: Normal heart sounds. No murmur.  Pulmonary:     Effort: Pulmonary effort is normal.     Breath sounds: Normal breath sounds. No wheezing or rales.  Chest:     Chest wall: No tenderness.  Abdominal:     General: Bowel sounds are normal. There is no distension.     Palpations: Abdomen is soft. There is no mass.     Tenderness: There is no abdominal tenderness.     Comments: Right upper quadrant diagonal scar  Musculoskeletal: Normal range of motion.  Lymphadenopathy:     Cervical: No cervical adenopathy.  Neurological:     Mental Status: She is alert and oriented to person, place, and time.     CMP Latest Ref Rng & Units 01/19/2019 09/18/2018 03/07/2018  Glucose 65 - 99 mg/dL 73 83 80  BUN 6 - 24 mg/dL 9 6 5(L)  Creatinine 0.57 - 1.00 mg/dL 0.78 0.78 0.65  Sodium 134 - 144 mmol/L 144 139 144  Potassium 3.5 - 5.2 mmol/L 4.0 3.5 2.9(L)  Chloride 96 - 106 mmol/L 101 97 104  CO2 20 - 29 mmol/L 27 25 24   Calcium 8.7 - 10.2 mg/dL 9.5 9.7 9.4  Total Protein 6.0 - 8.5 g/dL 7.3 - 7.2  Total Bilirubin 0.0 - 1.2 mg/dL 0.8 - 1.0  Alkaline Phos 39 - 117 IU/L 182(H) - 170(H)  AST 0 - 40 IU/L 20 - 23  ALT 0 - 32 IU/L 14 - 11    Lipid Panel     Component Value Date/Time   CHOL 152 01/19/2019 1435   TRIG 226 (H) 01/19/2019 1435   HDL 35 (L) 01/19/2019 1435   CHOLHDL 4.3 01/19/2019 1435   CHOLHDL 4.8 10/26/2016 0933   VLDL 22 10/26/2016 0933    LDLCALC 72 01/19/2019 1435    CBC    Component Value Date/Time   WBC 8.0 12/07/2017 1154   RBC 4.65 12/07/2017 1154   HGB 13.9 12/07/2017 1154   HGB 11.2 05/30/2017 0903   HCT 39.9 12/07/2017 1154   HCT 34.6 05/30/2017 0903   PLT 289 12/07/2017 1154   PLT 329 05/30/2017 0903   MCV 85.8 12/07/2017 1154   MCV 91 05/30/2017 0903   MCH 29.9 12/07/2017 1154  MCHC 34.8 12/07/2017 1154   RDW 13.5 12/07/2017 1154   RDW 14.0 05/30/2017 0903   LYMPHSABS 1.9 05/30/2017 0903   MONOABS 0.6 12/29/2015 1126   EOSABS 0.2 05/30/2017 0903   BASOSABS 0.0 05/30/2017 5520    Lab Results  Component Value Date   HGBA1C 5.0 12/29/2015    Assessment & Plan:   1. Neck swelling No lymphadenopathy noted Could be secondary to weight gain We will need to exclude underlying pathology - US Soft Tissue Head/Neck; Future  2. Ulcer of the duodenum caused by bacteria (H. pylori) Uncontrolled on PPI - Ambulatory referral to Gastroenterology  3. Aphthous ulcer - lidocaine (XYLOCAINE) 2 % solution; Use as directed 15 mLs in the mouth or throat every 6 (six) hours as needed for mouth pain.  Dispense: 100 mL; Refill: 0   Meds ordered this encounter  Medications  . lidocaine (XYLOCAINE) 2 % solution    Sig: Use as directed 15 mLs in the mouth or throat every 6 (six) hours as needed for mouth pain.    Dispense:  100 mL    Refill:  0    Follow-up: Return in about 3 months (around 08/12/2019) for medical conditions.       Charlott Rakes, MD, FAAFP. [facility] [place] [place], [place]   05/12/2019, 2:12 PM

## 2019-05-19 ENCOUNTER — Other Ambulatory Visit: Payer: Self-pay

## 2019-05-19 ENCOUNTER — Ambulatory Visit (HOSPITAL_COMMUNITY)
Admission: RE | Admit: 2019-05-19 | Discharge: 2019-05-19 | Disposition: A | Discharge status: Home / Self Care | Payer: Self-pay | Source: Ambulatory Visit | Attending: Family Medicine | Admitting: Family Medicine

## 2019-05-19 DIAGNOSIS — R221 Localized swelling, mass and lump, neck: Secondary | ICD-10-CM | POA: Insufficient documentation

## 2019-05-20 ENCOUNTER — Ambulatory Visit (AMBULATORY_SURGERY_CENTER): Payer: Self-pay | Admitting: Gastroenterology

## 2019-05-20 ENCOUNTER — Encounter: Payer: Self-pay | Admitting: Gastroenterology

## 2019-05-20 VITALS — BP 163/85 | HR 53 | Temp 98.4°F | Resp 16 | Ht <= 58 in | Wt 122.0 lb

## 2019-05-20 DIAGNOSIS — D125 Benign neoplasm of sigmoid colon: Secondary | ICD-10-CM

## 2019-05-20 DIAGNOSIS — K621 Rectal polyp: Secondary | ICD-10-CM

## 2019-05-20 DIAGNOSIS — Z1211 Encounter for screening for malignant neoplasm of colon: Secondary | ICD-10-CM

## 2019-05-20 DIAGNOSIS — D128 Benign neoplasm of rectum: Secondary | ICD-10-CM

## 2019-05-20 DIAGNOSIS — D120 Benign neoplasm of cecum: Secondary | ICD-10-CM

## 2019-05-20 MED ORDER — SODIUM CHLORIDE 0.9 % IV SOLN: 500.0000 mL | Freq: Once | INTRAVENOUS | Status: DC

## 2019-05-20 NOTE — Op Note (Signed)
[facility] Patient Name: Alice Woodard Procedure Date: 05/20/2019 9:54 AM MRN: [id number] Endoscopist: Mauri Pole , MD Age: 48 Referring MD:  Date of Birth: 11-23-1970 Gender: Female Account #: [id number] Procedure:                Colonoscopy Indications:              Screening for colorectal malignant neoplasm Medicines:                Monitored Anesthesia Care Procedure:                Pre-Anesthesia Assessment:                           - Prior to the procedure, a History and Physical                            was performed, and patient medications and                            allergies were reviewed. The patient's tolerance of                            previous anesthesia was also reviewed. The risks                            and benefits of the procedure and the sedation                            options and risks were discussed with the patient.                            All questions were answered, and informed consent                            was obtained. Prior Anticoagulants: The patient has                            taken no previous anticoagulant or antiplatelet                            agents. ASA Grade Assessment: II - A patient with                            mild systemic disease. After reviewing the risks                            and benefits, the patient was deemed in                            satisfactory condition to undergo the procedure.                           After obtaining informed consent, the colonoscope  was passed under direct vision. Throughout the                            procedure, the patient's blood pressure, pulse, and                            oxygen saturations were monitored continuously. The                            Model PCF-[id number]) scope was introduced                            through the anus and advanced to the the terminal                            ileum, with  identification of the appendiceal                            orifice and IC valve. The colonoscopy was performed                            without difficulty. The patient tolerated the                            procedure well. The quality of the bowel                            preparation was excellent. The terminal ileum,                            ileocecal valve, appendiceal orifice, and rectum                            were photographed. Scope In: 9:59:24 AM Scope Out: 10:10:39 AM Scope Withdrawal Time: 0 hours 8 minutes 50 seconds  Total Procedure Duration: 0 hours 11 minutes 15 seconds  Findings:                 The perianal and digital rectal examinations were                            normal.                           Two sessile polyps were found in the sigmoid colon                            and cecum. The polyps were 4 to 6 mm in size. These                            polyps were removed with a cold snare. Resection                            and retrieval were complete.  Three sessile polyps were found in the rectum. The                            polyps were 1 to 2 mm in size. These polyps were                            removed with a cold biopsy forceps. Resection and                            retrieval were complete.                           Non-bleeding internal hemorrhoids were found during                            retroflexion. The hemorrhoids were small. Complications:            No immediate complications. Estimated Blood Loss:     Estimated blood loss was minimal. Impression:               - Two 4 to 6 mm polyps in the sigmoid colon and in                            the cecum, removed with a cold snare. Resected and                            retrieved.                           - Three 1 to 2 mm polyps in the rectum, removed                            with a cold biopsy forceps. Resected and retrieved.                           -  Non-bleeding internal hemorrhoids. Recommendation:           - Patient has a contact number available for                            emergencies. The signs and symptoms of potential                            delayed complications were discussed with the                            patient. Return to normal activities tomorrow.                            Written discharge instructions were provided to the                            patient.                           -  Resume previous diet.                           - Continue present medications.                           - Await pathology results.                           - Repeat colonoscopy in 3 - 5 years for                            surveillance based on pathology results. Mauri Pole, MD 05/20/2019 10:17:17 AM This report has been signed electronically.

## 2019-05-20 NOTE — Progress Notes (Signed)
Pt's states no medical or surgical changes since previsit or office visit.  Temp CW Vitals JB 

## 2019-05-20 NOTE — Patient Instructions (Signed)
YOU HAD AN ENDOSCOPIC PROCEDURE TODAY AT [facility]:   Refer to the procedure report that was given to you for any specific questions about what was found during the examination.  If the procedure report does not answer your questions, please call your gastroenterologist to clarify.  If you requested that your care partner not be given the details of your procedure findings, then the procedure report has been included in a sealed envelope for you to review at your convenience later.  YOU SHOULD EXPECT: Some feelings of bloating in the abdomen. Passage of more gas than usual.  Walking can help get rid of the air that was put into your GI tract during the procedure and reduce the bloating. If you had a lower endoscopy (such as a colonoscopy or flexible sigmoidoscopy) you may notice spotting of blood in your stool or on the toilet paper. If you underwent a bowel prep for your procedure, you may not have a normal bowel movement for a few days.  Please Note:  You might notice some irritation and congestion in your nose or some drainage.  This is from the oxygen used during your procedure.  There is no need for concern and it should clear up in a day or so.  SYMPTOMS TO REPORT IMMEDIATELY:   Following lower endoscopy (colonoscopy or flexible sigmoidoscopy):  Excessive amounts of blood in the stool  Significant tenderness or worsening of abdominal pains  Swelling of the abdomen that is new, acute  Fever of 100F or higher  For urgent or emergent issues, a gastroenterologist can be reached at any hour by calling [phone].   DIET:  We do recommend a small meal at first, but then you may proceed to your regular diet.  Drink plenty of fluids but you should avoid alcoholic beverages for 24 hours.  ACTIVITY:  You should plan to take it easy for the rest of today and you should NOT DRIVE or use heavy machinery until tomorrow (because of the sedation medicines used during the test).     FOLLOW UP: Our staff will call the number listed on your records 48-72 hours following your procedure to check on you and address any questions or concerns that you may have regarding the information given to you following your procedure. If we do not reach you, we will leave a message.  We will attempt to reach you two times.  During this call, we will ask if you have developed any symptoms of COVID 19. If you develop any symptoms (ie: fever, flu-like symptoms, shortness of breath, cough etc.) before then, please call [phone].  If you test positive for Covid 19 in the 2 weeks post procedure, please call and report this information to [place].    If any biopsies were taken you will be contacted by phone or by letter within the next 1-3 weeks.  Please call us at [phone] if you have not heard about the biopsies in 3 weeks.    SIGNATURES/CONFIDENTIALITY: You and/or your care partner have signed paperwork which will be entered into your electronic medical record.  These signatures attest to the fact that that the information above on your After Visit Summary has been reviewed and is understood.  Full responsibility of the confidentiality of this discharge information lies with you and/or your care-partner.  Await pathology  Please read over handouts about hemorrhoids and polyps  Please continue your normal medications

## 2019-05-20 NOTE — Progress Notes (Signed)
A/ox3, pleased with MAC, report to RN 

## 2019-05-20 NOTE — Progress Notes (Signed)
1012- pt c/o upper abdominal discomfort on arrival to [facility].  She states this is not new to her- she usually has upper abdominal pain.  1020- pt has passed gas now and denies any further discomfort  Interpreter used today at the [facility] for this pt.  Interpreter's name is- Arti

## 2019-05-20 NOTE — Progress Notes (Signed)
Called to room to assist during endoscopic procedure.  Patient ID and intended procedure confirmed with present staff. Received instructions for my participation in the procedure from the performing physician.  

## 2019-05-22 ENCOUNTER — Telehealth: Payer: Self-pay | Admitting: *Deleted

## 2019-05-22 ENCOUNTER — Telehealth: Payer: Self-pay

## 2019-05-22 MED FILL — LISINOPRIL-HCTZ 20-12.5 MG: 20-12.5 | 30 days supply | Qty: 30 | Fill #3

## 2019-05-22 NOTE — Telephone Encounter (Signed)
Daughter returning call. Tells me her mother is doing very well. Thanks [place] for the call.

## 2019-05-22 NOTE — Telephone Encounter (Signed)
Follow up call first attempt, left message for pt to call if there are any questions or problems or covid 19 symptoms or exposure, otherwise no reason to call back and we will check on her later.

## 2019-05-22 NOTE — Telephone Encounter (Signed)
Patient's daughter was called and informed of [place] results.

## 2019-05-22 NOTE — Telephone Encounter (Signed)
  Follow up Call-  Call back number 05/20/2019  Post procedure Call Back phone  # [phone] Hema-daughter speaks [place] , pt speaks [place]  Permission to leave phone message Yes  Some recent data might be hidden    Was not able to reach patient or daughter.  Spoke with son who did not speak english well.  He seemed unaware that his mom had a procedure.  Attempted to reach pt at other number listed but no answer.

## 2019-05-22 NOTE — Telephone Encounter (Signed)
-----   Message from Charlott Rakes, MD sent at 05/19/2019  5:05 PM EDT ----- Ultrasound reveals lymph nodes in the area of swelling she complained about.  The lymph nodes appear normal and no change in management is needed at this time.

## 2019-05-27 ENCOUNTER — Other Ambulatory Visit: Payer: Self-pay

## 2019-05-27 ENCOUNTER — Ambulatory Visit: Payer: Self-pay | Attending: Family Medicine

## 2019-06-02 ENCOUNTER — Encounter: Payer: Self-pay | Admitting: Gastroenterology

## 2019-06-26 MED FILL — ?PANTOPRAZOLE SODI DR 40MGT: 40 | 30 days supply | Qty: 30 | Fill #3

## 2019-06-26 MED FILL — ?ATORVASTATIN 20 MG TABLET: 20 | 30 days supply | Qty: 30 | Fill #3

## 2019-06-26 MED FILL — cloNIDine HCL 0.1 MG TABS: 0.1 | 30 days supply | Qty: 30 | Fill #3

## 2019-07-03 ENCOUNTER — Other Ambulatory Visit: Payer: Self-pay

## 2019-07-03 ENCOUNTER — Encounter (HOSPITAL_COMMUNITY): Payer: Self-pay | Admitting: Emergency Medicine

## 2019-07-03 ENCOUNTER — Emergency Department (HOSPITAL_COMMUNITY)
Admission: EM | Admit: 2019-07-03 | Discharge: 2019-07-04 | Disposition: A | Discharge status: Home / Self Care | Payer: Self-pay | Attending: Emergency Medicine | Admitting: Emergency Medicine

## 2019-07-03 DIAGNOSIS — Z79899 Other long term (current) drug therapy: Secondary | ICD-10-CM | POA: Insufficient documentation

## 2019-07-03 DIAGNOSIS — K1370 Unspecified lesions of oral mucosa: Secondary | ICD-10-CM | POA: Insufficient documentation

## 2019-07-03 DIAGNOSIS — I10 Essential (primary) hypertension: Secondary | ICD-10-CM | POA: Insufficient documentation

## 2019-07-03 DIAGNOSIS — L259 Unspecified contact dermatitis, unspecified cause: Secondary | ICD-10-CM | POA: Insufficient documentation

## 2019-07-03 DIAGNOSIS — G4700 Insomnia, unspecified: Secondary | ICD-10-CM | POA: Insufficient documentation

## 2019-07-03 LAB — CBC
HCT: 37.6 % (ref 36.0–46.0)
Hemoglobin: 13 g/dL (ref 12.0–15.0)
MCH: 30.4 pg (ref 26.0–34.0)
MCHC: 34.6 g/dL (ref 30.0–36.0)
MCV: 87.9 fL (ref 80.0–100.0)
Platelets: 320 10*3/uL (ref 150–400)
RBC: 4.28 MIL/uL (ref 3.87–5.11)
RDW: 13 % (ref 11.5–15.5)
WBC: 10.3 10*3/uL (ref 4.0–10.5)
nRBC: 0 % (ref 0.0–0.2)

## 2019-07-03 LAB — BASIC METABOLIC PANEL
Anion gap: 12 (ref 5–15)
BUN: 6 mg/dL (ref 6–20)
CO2: 24 mmol/L (ref 22–32)
Calcium: 9.1 mg/dL (ref 8.9–10.3)
Chloride: 105 mmol/L (ref 98–111)
Creatinine, Ser: 0.79 mg/dL (ref 0.44–1.00)
GFR calc Af Amer: >60 mL/min (ref >60)
GFR calc non Af Amer: >60 mL/min (ref >60)
Glucose, Bld: 176 mg/dL — ABNORMAL HIGH (ref 70–99)
Potassium: 3 mmol/L — ABNORMAL LOW (ref 3.5–5.1)
Sodium: 141 mmol/L (ref 135–145)

## 2019-07-03 MED ORDER — ZOLPIDEM TARTRATE 5 MG PO TABS: 5.0000 mg | ORAL_TABLET | Freq: Every evening | ORAL | 0 refills | Status: DC | PRN

## 2019-07-03 MED ORDER — HYDROXYZINE HCL 25 MG PO TABS: 25.0000 mg | ORAL_TABLET | Freq: Four times a day (QID) | ORAL | 0 refills | Status: DC

## 2019-07-03 NOTE — ED Provider Notes (Signed)
[facility] EMERGENCY DEPARTMENT Provider Note   CSN: [id number] Arrival date & time: 07/03/19  1625     History   Chief Complaint Chief Complaint  Patient presents with  . Rash    HPI Alice Woodard is a 48 y.o. female.     Patient is a 48 year old female with past medical history of hypertension, hyperlipidemia, and gastritis.  She presents today for evaluation of difficulty sleeping.  Patient states that for the past 3 weeks she has been unable to fall asleep.  She denies any chest pain, difficulty breathing, fevers, new stressors, or depression.  She denies excessive caffeine use.  She was given a sleeping medication by her primary doctor, however this has not helped.  Patient also describes a rash to both lower legs that is itchy and has been present for the past 2 weeks.  She also describes small bumps on her tongue that have been there for many months.  She has seen her doctor, but no explanation given.  Patient is non-English speaking.  History taken with the assistance of the daughter who was present at bedside and acts as Optometrist.  The history is provided by the patient.    Past Medical History:  Diagnosis Date  . Anemia   . History of blood transfusion 04/17/2017   "anemic"  . Hyperlipidemia   . Hypertension     Patient Active Problem List   Diagnosis Date Noted  . Ulcer of the duodenum caused by bacteria (H. pylori) 04/19/2017  . Gastrointestinal hemorrhage associated with duodenal ulcer   . Upper GI bleed   . Symptomatic anemia 04/17/2017  . Carpal tunnel syndrome 01/25/2017  . Gastritis and gastroduodenitis 05/04/2016  . Achilles tendinitis of right lower extremity 05/04/2016  . Healthcare maintenance 12/29/2015  . SOB (shortness of breath) 12/29/2015  . Blurry vision, bilateral 11/18/2014  . Dyslipidemia 05/24/2014  . Essential hypertension, benign 05/24/2014    Past Surgical History:  Procedure Laterality Date  . APPENDECTOMY     . CHOLECYSTECTOMY OPEN    . ESOPHAGOGASTRODUODENOSCOPY N/A 04/18/2017   Procedure: ESOPHAGOGASTRODUODENOSCOPY (EGD);  Surgeon: Mauri Pole, MD;  Location[facility] ENDOSCOPY;  Service: Endoscopy;  Laterality: N/A;  . UPPER GASTROINTESTINAL ENDOSCOPY  04/2017     OB History   No obstetric history on file.      Home Medications    Prior to Admission medications   Medication Sig Start Date End Date Taking? Authorizing Provider  acetaminophen (TYLENOL) 325 MG tablet Take 2 tablets (650 mg total) by mouth every 6 (six) hours as needed for mild pain (or Fever >/= 101). 04/18/17   Jule Ser, DO  atorvastatin (LIPITOR) 20 MG tablet Take 1 tablet (20 mg total) by mouth daily. 01/14/19   Charlott Rakes, MD  benzonatate (TESSALON) 100 MG capsule Take 1 capsule (100 mg total) by mouth 3 (three) times daily. 04/20/19   Charlott Rakes, MD  cetirizine (ZYRTEC) 10 MG tablet Take 1 tablet (10 mg total) by mouth daily. Patient not taking: Reported on 05/20/2019 01/14/19   Charlott Rakes, MD  cloNIDine (CATAPRES) 0.1 MG tablet Take 1 tablet (0.1 mg total) by mouth at bedtime. For hotflashes 01/14/19   Charlott Rakes, MD  dicyclomine (BENTYL) 10 MG capsule Take 1 capsule (10 mg total) by mouth 3 (three) times daily as needed (abdominal discomfort and cramping). Patient not taking: Reported on 05/20/2019 02/25/19   Mauri Pole, MD  lidocaine (XYLOCAINE) 2 % solution Use as directed 15 mLs in  the mouth or throat every 6 (six) hours as needed for mouth pain. 05/12/19   Charlott Rakes, MD  lisinopril-hydrochlorothiazide (ZESTORETIC) 20-12.5 MG tablet Take 1 tablet by mouth daily. 01/14/19   Charlott Rakes, MD  pantoprazole (PROTONIX) 40 MG tablet Take 1 tablet (40 mg total) by mouth 2 (two) times daily. 04/20/19   Charlott Rakes, MD  potassium chloride (K-DUR,KLOR-CON) 10 MEQ tablet Take 1 tablet (10 mEq total) by mouth daily. Patient not taking: Reported on 05/20/2019 01/14/19   Charlott Rakes, MD  traZODone  (DESYREL) 50 MG tablet Take 1 tablet (50 mg total) by mouth at bedtime as needed for sleep. 01/14/19   Charlott Rakes, MD    Family History Family History  Problem Relation Age of Onset  . Cancer - Other Mother        blood  . Colon cancer Neg Hx   . Esophageal cancer Neg Hx   . Rectal cancer Neg Hx   . Stomach cancer Neg Hx     Social History Social History   Tobacco Use  . Smoking status: Never Smoker  . Smokeless tobacco: Never Used  Substance Use Topics  . Alcohol use: No  . Drug use: No     Allergies   Amlodipine   Review of Systems Review of Systems  All other systems reviewed and are negative.    Physical Exam Updated Vital Signs BP (!) 187/113 Comment: pt reports she did not take BP meds today  Pulse 85   Temp 98.9 F (37.2 C) (Oral)   Resp 16   LMP 11/15/2014   SpO2 96%   Physical Exam Vitals signs and nursing note reviewed.  Constitutional:      General: She is not in acute distress.    Appearance: She is well-developed. She is not diaphoretic.  HENT:     Head: Normocephalic and atraumatic.  Neck:     Musculoskeletal: Normal range of motion and neck supple.  Cardiovascular:     Rate and Rhythm: Normal rate and regular rhythm.     Heart sounds: No murmur. No friction rub. No gallop.   Pulmonary:     Effort: Pulmonary effort is normal. No respiratory distress.     Breath sounds: Normal breath sounds. No wheezing.  Abdominal:     General: Bowel sounds are normal. There is no distension.     Palpations: Abdomen is soft.     Tenderness: There is no abdominal tenderness.  Musculoskeletal: Normal range of motion.  Skin:    General: Skin is warm and dry.     Comments: There is a macular, pruritic rash to the medial and lateral aspect of both knees.  Neurological:     Mental Status: She is alert and oriented to person, place, and time.      ED Treatments / Results  Labs (all labs ordered are listed, but only abnormal results are displayed)  Labs Reviewed  BASIC METABOLIC PANEL - Abnormal; Notable for the following components:      Result Value   Potassium 3.0 (*)    Glucose, Bld 176 (*)    All other components within normal limits  CBC    EKG EKG Interpretation  Date/Time:  Friday July 03 2019 16:29:44 EDT Ventricular Rate:  70 PR Interval:  138 QRS Duration: 70 QT Interval:  424 QTC Calculation: 457 R Axis:   29 Text Interpretation:  Normal sinus rhythm Cannot rule out Anterior infarct , age undetermined Abnormal ECG No significant change since  last tracing Confirmed by Virgel Manifold [phone]) on 07/03/2019 9:17:26 PM   Radiology No results found.  Procedures Procedures (including critical care time)  Medications Ordered in ED Medications - No data to display   Initial Impression / Assessment and Plan / ED Course  I have reviewed the triage vital signs and the nursing notes.  Pertinent labs & imaging results that were available during my care of the patient were reviewed by me and considered in my medical decision making (see chart for details).  Patient's laboratory studies are unremarkable and physical examination shows no concerning findings.  Vitals are stable.  I am uncertain as to the patient's reason for her insomnia.  I have advised and I will prescribe a small quantity of Ambien and see if this helps.  Patient needs to follow-up with primary doctor for further evaluation.  The rash on her legs appears macular.  This will be treated with steroids and antihistamines.  Patient to follow-up with primary doctor as nothing tonight appears emergent.  Final Clinical Impressions(s) / ED Diagnoses   Final diagnoses:  None    ED Discharge Orders    None       Veryl Speak, MD 07/03/19 [phone]

## 2019-07-03 NOTE — Discharge Instructions (Addendum)
Begin taking Ambien as needed for sleep.  If this medication helps you, you can follow-up with your primary doctor for additional prescriptions.  Also begin taking hydroxyzine as prescribed as needed for rash.

## 2019-07-03 NOTE — ED Triage Notes (Signed)
Pt BIB family, c/o rash to BLE x 2 weeks, bumps on the back of her tongue, dizziness and insomnia x 2 weeks. Pt reports itchy, watery eyes as well. Denies chest pain/shortness of breath.

## 2019-07-04 NOTE — ED Notes (Signed)
Discharge instructions discussed with pt and daughter at bedside pt verbalizedunderstanding no questions at this time

## 2019-07-06 MED FILL — hydrOXYzine HCL 25 MG TABS: 25 | 3 days supply | Qty: 12 | Fill #0

## 2019-07-29 MED FILL — LISINOPRIL-HCTZ 20-12.5 MG: 20-12.5 | 30 days supply | Qty: 30 | Fill #4

## 2019-08-17 ENCOUNTER — Ambulatory Visit: Payer: Self-pay | Attending: Family Medicine | Admitting: Family Medicine

## 2019-08-17 ENCOUNTER — Other Ambulatory Visit: Payer: Self-pay

## 2019-08-17 ENCOUNTER — Encounter: Payer: Self-pay | Admitting: Family Medicine

## 2019-08-17 VITALS — BP 143/88 | HR 69 | Temp 98.3°F | Ht <= 58 in | Wt 126.0 lb

## 2019-08-17 DIAGNOSIS — R0982 Postnasal drip: Secondary | ICD-10-CM

## 2019-08-17 DIAGNOSIS — R05 Cough: Secondary | ICD-10-CM

## 2019-08-17 DIAGNOSIS — M79605 Pain in left leg: Secondary | ICD-10-CM

## 2019-08-17 DIAGNOSIS — M79604 Pain in right leg: Secondary | ICD-10-CM

## 2019-08-17 DIAGNOSIS — K2990 Gastroduodenitis, unspecified, without bleeding: Secondary | ICD-10-CM

## 2019-08-17 DIAGNOSIS — K2970 Gastritis, unspecified, without bleeding: Secondary | ICD-10-CM

## 2019-08-17 DIAGNOSIS — G4709 Other insomnia: Secondary | ICD-10-CM

## 2019-08-17 DIAGNOSIS — R059 Cough, unspecified: Secondary | ICD-10-CM

## 2019-08-17 DIAGNOSIS — E876 Hypokalemia: Secondary | ICD-10-CM

## 2019-08-17 DIAGNOSIS — I10 Essential (primary) hypertension: Secondary | ICD-10-CM

## 2019-08-17 MED ORDER — PANTOPRAZOLE SODIUM 40 MG PO TBEC: 40.0000 mg | DELAYED_RELEASE_TABLET | Freq: Two times a day (BID) | ORAL | 3 refills | Status: DC

## 2019-08-17 MED ORDER — POTASSIUM CHLORIDE CRYS ER 10 MEQ PO TBCR: 10.0000 meq | EXTENDED_RELEASE_TABLET | Freq: Every day | ORAL | 1 refills | Status: DC

## 2019-08-17 MED ORDER — CYCLOBENZAPRINE HCL 10 MG PO TABS: 10.0000 mg | ORAL_TABLET | Freq: Every day | ORAL | 1 refills | Status: DC

## 2019-08-17 MED ORDER — TRAZODONE HCL 100 MG PO TABS: 100.0000 mg | ORAL_TABLET | Freq: Every evening | ORAL | 3 refills | Status: DC | PRN

## 2019-08-17 MED ORDER — HYDROCHLOROTHIAZIDE 25 MG PO TABS: 25.0000 mg | ORAL_TABLET | Freq: Every day | ORAL | 6 refills | Status: DC

## 2019-08-17 MED ORDER — BENZONATATE 100 MG PO CAPS: 100.0000 mg | ORAL_CAPSULE | Freq: Three times a day (TID) | ORAL | 0 refills | Status: DC

## 2019-08-17 MED ORDER — CETIRIZINE HCL 10 MG PO TABS: 10.0000 mg | ORAL_TABLET | Freq: Every day | ORAL | 1 refills | Status: DC

## 2019-08-17 MED ORDER — CARVEDILOL 6.25 MG PO TABS: 6.2500 mg | ORAL_TABLET | Freq: Two times a day (BID) | ORAL | 3 refills | Status: DC

## 2019-08-17 MED FILL — ?CARVEDILOL 6.25 MG TABLET: 6.25 | 30 days supply | Qty: 60 | Fill #0

## 2019-08-17 MED FILL — traZODone HCL 100 MG TABS: 100 | 30 days supply | Qty: 30 | Fill #0

## 2019-08-17 MED FILL — CYCLOBENZAPRINE 10 MG TAB: 10 | 30 days supply | Qty: 30 | Fill #0

## 2019-08-17 MED FILL — ?HYDROCHLOROTHIAZIDE 25MG T: 25 | 30 days supply | Qty: 30 | Fill #0

## 2019-08-17 MED FILL — PANTOPRAZOLE SOD DR 40 MG T: 40 | 30 days supply | Qty: 60 | Fill #0

## 2019-08-17 MED FILL — BENZONATATE 100 MG CAPS: 100 | 10 days supply | Qty: 30 | Fill #0

## 2019-08-17 MED FILL — ?CETIRIZINE HCL 10 MG TABLE: 10 | 30 days supply | Qty: 30 | Fill #0

## 2019-08-17 MED FILL — POTASSIUM CHLORIDE ER 10 ME: 10 | 30 days supply | Qty: 30 | Fill #0

## 2019-08-17 NOTE — Progress Notes (Signed)
Subjective:  Patient ID: Alice Woodard, female    DOB: Jan 24, 1971  Age: 48 y.o. MRN: [id number]  CC: Hypertension   HPI Alice Woodard  is a 48 year old female with a history of hypertension, Achilles tendinitis, dyslipidemia,  peptic ulcer secondary to H. pylori status post cauterization of bleeding vessels who presents today for a follow-up visit. She is accompanied by her daughter and complains of pain in both legs described as cramping which occur mostly at night.  Pain is absent at this time She also complains of insomnia which is uncontrolled on 50 mg of trazodone.  She denies caffeine intake. She has also been coughing and this prevents her from sleeping.  Previously prescribed Zyrtec and Tessalon Perles which she is currently not taking. She noticed neck swelling which is intermittent and this is corroborated by her daughter but she denies dyspnea, tongue swelling but does note that she has had some sores on the left side of her tongue which have not been relieved by the use of viscous lidocaine. Her gastritis have been stable on omeprazole and she denies hematochezia or hematemesis.  Past Medical History:  Diagnosis Date  . Anemia   . History of blood transfusion 04/17/2017   "anemic"  . Hyperlipidemia   . Hypertension     Past Surgical History:  Procedure Laterality Date  . APPENDECTOMY    . CHOLECYSTECTOMY OPEN    . ESOPHAGOGASTRODUODENOSCOPY N/A 04/18/2017   Procedure: ESOPHAGOGASTRODUODENOSCOPY (EGD);  Surgeon: Mauri Pole, MD;  Location: [facility] ENDOSCOPY;  Service: Endoscopy;  Laterality: N/A;  . UPPER GASTROINTESTINAL ENDOSCOPY  04/2017    Family History  Problem Relation Age of Onset  . Cancer - Other Mother        blood  . Colon cancer Neg Hx   . Esophageal cancer Neg Hx   . Rectal cancer Neg Hx   . Stomach cancer Neg Hx     Allergies  Allergen Reactions  . Amlodipine Rash    Outpatient Medications Prior to Visit  Medication Sig Dispense Refill  .  acetaminophen (TYLENOL) 325 MG tablet Take 2 tablets (650 mg total) by mouth every 6 (six) hours as needed for mild pain (or Fever >/= 101). 60 tablet 0  . cloNIDine (CATAPRES) 0.1 MG tablet Take 1 tablet (0.1 mg total) by mouth at bedtime. For hotflashes 90 tablet 1  . hydrOXYzine (ATARAX/VISTARIL) 25 MG tablet Take 1 tablet (25 mg total) by mouth every 6 (six) hours. 12 tablet 0  . lidocaine (XYLOCAINE) 2 % solution Use as directed 15 mLs in the mouth or throat every 6 (six) hours as needed for mouth pain. 100 mL 0  . lisinopril-hydrochlorothiazide (ZESTORETIC) 20-12.5 MG tablet Take 1 tablet by mouth daily. 90 tablet 1  . pantoprazole (PROTONIX) 40 MG tablet Take 1 tablet (40 mg total) by mouth 2 (two) times daily. 60 tablet 3  . traZODone (DESYREL) 50 MG tablet Take 1 tablet (50 mg total) by mouth at bedtime as needed for sleep. 90 tablet 1  . atorvastatin (LIPITOR) 20 MG tablet Take 1 tablet (20 mg total) by mouth daily. (Patient not taking: Reported on 08/17/2019) 90 tablet 1  . dicyclomine (BENTYL) 10 MG capsule Take 1 capsule (10 mg total) by mouth 3 (three) times daily as needed (abdominal discomfort and cramping). (Patient not taking: Reported on 05/20/2019) 270 capsule 3  . zolpidem (AMBIEN) 5 MG tablet Take 1 tablet (5 mg total) by mouth at bedtime as needed for sleep. (Patient  not taking: Reported on 08/17/2019) 6 tablet 0  . benzonatate (TESSALON) 100 MG capsule Take 1 capsule (100 mg total) by mouth 3 (three) times daily. (Patient not taking: Reported on 08/17/2019) 30 capsule 0  . cetirizine (ZYRTEC) 10 MG tablet Take 1 tablet (10 mg total) by mouth daily. (Patient not taking: Reported on 05/20/2019) 90 tablet 1  . potassium chloride (K-DUR,KLOR-CON) 10 MEQ tablet Take 1 tablet (10 mEq total) by mouth daily. (Patient not taking: Reported on 05/20/2019) 90 tablet 1   Facility-Administered Medications Prior to Visit  Medication Dose Route Frequency Provider Last Rate Last Dose  . 0.9 %  sodium  chloride infusion  500 mL Intravenous Once Nandigam, Kavitha V, MD         ROS Review of Systems  Constitutional: Negative for activity change, appetite change and fatigue.  HENT: Negative for congestion, sinus pressure and sore throat.   Eyes: Negative for visual disturbance.  Respiratory: Negative for cough, chest tightness, shortness of breath and wheezing.   Cardiovascular: Negative for chest pain and palpitations.  Gastrointestinal: Negative for abdominal distention, abdominal pain and constipation.  Endocrine: Negative for polydipsia.  Genitourinary: Negative for dysuria and frequency.  Musculoskeletal:       See HPI  Skin: Negative for rash.  Neurological: Negative for tremors, light-headedness and numbness.  Hematological: Does not bruise/bleed easily.  Psychiatric/Behavioral: Negative for agitation and behavioral problems.    Objective:  BP (!) 143/88   Pulse 69   Temp 98.3 F (36.8 C) (Oral)   Ht 4\' 10"  (1.473 m)   Wt 126 lb (57.2 kg)   LMP 11/15/2014   SpO2 99%   BMI 26.33 kg/m   BP/Weight 08/17/2019 [id number]  Systolic BP A999333 [id number]  Diastolic BP 88 [id number] 85  Wt. (Lbs) 126 - 122  BMI 26.33 - 25.5      Physical Exam Constitutional:      Appearance: She is well-developed.  Neck:     Vascular: No JVD.     Comments: Fullness of submandibular area Cardiovascular:     Rate and Rhythm: Normal rate.     Heart sounds: Normal heart sounds. No murmur.  Pulmonary:     Effort: Pulmonary effort is normal.     Breath sounds: Normal breath sounds. No wheezing or rales.  Chest:     Chest wall: No tenderness.  Abdominal:     General: Bowel sounds are normal. There is no distension.     Palpations: Abdomen is soft. There is no mass.     Tenderness: There is no abdominal tenderness.  Musculoskeletal: Normal range of motion.     Right lower leg: No edema.     Left lower leg: No edema.  Neurological:     Mental Status: She is alert and oriented to person,  place, and time.  Psychiatric:        Mood and Affect: Mood normal.     CMP Latest Ref Rng & Units 07/03/2019 01/19/2019 09/18/2018  Glucose 70 - 99 mg/dL 176(H) 73 83  BUN 6 - 20 mg/dL 6 9 6   Creatinine 0.44 - 1.00 mg/dL 0.79 0.78 0.78  Sodium 135 - 145 mmol/L 141 144 139  Potassium 3.5 - 5.1 mmol/L 3.0(L) 4.0 3.5  Chloride 98 - 111 mmol/L 105 101 97  CO2 22 - 32 mmol/L 24 27 25   Calcium 8.9 - 10.3 mg/dL 9.1 9.5 9.7  Total Protein 6.0 - 8.5 g/dL - 7.3 -  Total Bilirubin 0.0 -  1.2 mg/dL - 0.8 -  Alkaline Phos 39 - 117 IU/L - 182(H) -  AST 0 - 40 IU/L - 20 -  ALT 0 - 32 IU/L - 14 -    Lipid Panel     Component Value Date/Time   CHOL 152 01/19/2019 1435   TRIG 226 (H) 01/19/2019 1435   HDL 35 (L) 01/19/2019 1435   CHOLHDL 4.3 01/19/2019 1435   CHOLHDL 4.8 10/26/2016 0933   VLDL 22 10/26/2016 0933   LDLCALC 72 01/19/2019 1435    CBC    Component Value Date/Time   WBC 10.3 07/03/2019 1633   RBC 4.28 07/03/2019 1633   HGB 13.0 07/03/2019 1633   HGB 11.2 05/30/2017 0903   HCT 37.6 07/03/2019 1633   HCT 34.6 05/30/2017 0903   PLT 320 07/03/2019 1633   PLT 329 05/30/2017 0903   MCV 87.9 07/03/2019 1633   MCV 91 05/30/2017 0903   MCH 30.4 07/03/2019 1633   MCHC 34.6 07/03/2019 1633   RDW 13.0 07/03/2019 1633   RDW 14.0 05/30/2017 0903   LYMPHSABS 1.9 05/30/2017 0903   MONOABS 0.6 12/29/2015 1126   EOSABS 0.2 05/30/2017 0903   BASOSABS 0.0 05/30/2017 0903    Lab Results  Component Value Date   HGBA1C 5.0 12/29/2015    Assessment & Plan:   1. Hypokalemia Secondary to hydrochlorothiazide - potassium chloride (KLOR-CON) 10 MEQ tablet; Take 1 tablet (10 mEq total) by mouth daily.  Dispense: 90 tablet; Refill: 1 - Basic Metabolic Panel  2. Other insomnia Uncontrolled Increase trazodone from 50 mg 100 mg - traZODone (DESYREL) 100 MG tablet; Take 1 tablet (100 mg total) by mouth at bedtime as needed for sleep.  Dispense: 30 tablet; Refill: 3  3. Cough We will  discontinue lisinopril especially since she also has this with the week complaint of neck swelling as this could be a possible ACE inhibitor allergy. We will also treat for sinus etiology with Zyrtec - benzonatate (TESSALON) 100 MG capsule; Take 1 capsule (100 mg total) by mouth 3 (three) times daily.  Dispense: 30 capsule; Refill: 0  4. Post-nasal drip Could be causing her cough Not compliant with Zyrtec which I have refilled - cetirizine (ZYRTEC) 10 MG tablet; Take 1 tablet (10 mg total) by mouth daily.  Dispense: 90 tablet; Refill: 1  5. Gastritis and gastroduodenitis Stable - pantoprazole (PROTONIX) 40 MG tablet; Take 1 tablet (40 mg total) by mouth 2 (two) times daily.  Dispense: 60 tablet; Refill: 3  6. Pain in both lower extremities Unable to place on NSAIDs due to gastritis Commence Flexeril as she describes muscle spasms which occur at night - cyclobenzaprine (FLEXERIL) 10 MG tablet; Take 1 tablet (10 mg total) by mouth at bedtime.  Dispense: 30 tablet; Refill: 1  7. Essential hypertension, benign Slightly elevated above goal Due to cough will discontinue lisinopril/HCTZ and commence Coreg and HCTZ Counseled on blood pressure goal of less than 130/80, low-sodium, DASH diet, medication compliance, 150 minutes of moderate intensity exercise per week. Discussed medication compliance, adverse effects. - carvedilol (COREG) 6.25 MG tablet; Take 1 tablet (6.25 mg total) by mouth 2 (two) times daily with a meal.  Dispense: 60 tablet; Refill: 3 - hydrochlorothiazide (HYDRODIURIL) 25 MG tablet; Take 1 tablet (25 mg total) by mouth daily.  Dispense: 30 tablet; Refill: 6   Meds ordered this encounter  Medications  . potassium chloride (KLOR-CON) 10 MEQ tablet    Sig: Take 1 tablet (10 mEq total) by mouth  daily.    Dispense:  90 tablet    Refill:  1  . traZODone (DESYREL) 100 MG tablet    Sig: Take 1 tablet (100 mg total) by mouth at bedtime as needed for sleep.    Dispense:  30 tablet     Refill:  3    Dose change  . benzonatate (TESSALON) 100 MG capsule    Sig: Take 1 capsule (100 mg total) by mouth 3 (three) times daily.    Dispense:  30 capsule    Refill:  0  . cetirizine (ZYRTEC) 10 MG tablet    Sig: Take 1 tablet (10 mg total) by mouth daily.    Dispense:  90 tablet    Refill:  1  . pantoprazole (PROTONIX) 40 MG tablet    Sig: Take 1 tablet (40 mg total) by mouth 2 (two) times daily.    Dispense:  60 tablet    Refill:  3    Discontinue previous dose  . carvedilol (COREG) 6.25 MG tablet    Sig: Take 1 tablet (6.25 mg total) by mouth 2 (two) times daily with a meal.    Dispense:  60 tablet    Refill:  3  . hydrochlorothiazide (HYDRODIURIL) 25 MG tablet    Sig: Take 1 tablet (25 mg total) by mouth daily.    Dispense:  30 tablet    Refill:  6    Discontinue Lisinopril/HCTZ  . cyclobenzaprine (FLEXERIL) 10 MG tablet    Sig: Take 1 tablet (10 mg total) by mouth at bedtime.    Dispense:  30 tablet    Refill:  1    Follow-up: Return in about 3 months (around 11/17/2019) for medical conditions - in person.       Charlott Rakes, MD, FAAFP. [facility], [place]   08/17/2019, 3:41 PM

## 2019-08-17 NOTE — Progress Notes (Signed)
Patient is having pain in heels.  Patient sleeping medication not working.

## 2019-08-18 LAB — BASIC METABOLIC PANEL
BUN/Creatinine Ratio: 12 (ref 9–23)
BUN: 9 mg/dL (ref 6–24)
CO2: 25 mmol/L (ref 20–29)
Calcium: 9.5 mg/dL (ref 8.7–10.2)
Chloride: 102 mmol/L (ref 96–106)
Creatinine, Ser: 0.75 mg/dL (ref 0.57–1.00)
GFR calc Af Amer: 107 mL/min/{1.73_m2} (ref >59)
GFR calc non Af Amer: 93 mL/min/{1.73_m2} (ref >59)
Glucose: 85 mg/dL (ref 65–99)
Potassium: 3.5 mmol/L (ref 3.5–5.2)
Sodium: 141 mmol/L (ref 134–144)

## 2019-08-20 ENCOUNTER — Telehealth: Payer: Self-pay

## 2019-08-20 NOTE — Telephone Encounter (Signed)
Patient name and DOB has been verified Patient was informed of lab results. Patient had no questions.  

## 2019-08-20 NOTE — Telephone Encounter (Signed)
-----   Message from Charlott Rakes, MD sent at 08/18/2019 11:49 AM EDT ----- Labs are normal

## 2019-09-14 MED FILL — ?HYDROCHLOROTHIAZIDE 25MG T: 25 | 30 days supply | Qty: 30 | Fill #1

## 2019-09-14 MED FILL — POTASSIUM CHLORIDE ER 10 ME: 10 | 30 days supply | Qty: 30 | Fill #1

## 2019-09-14 MED FILL — ?CETIRIZINE HCL 10 MG TABLE: 10 | 30 days supply | Qty: 30 | Fill #1

## 2019-10-12 MED FILL — PANTOPRAZOLE SOD DR 40 MG T: 40 | 30 days supply | Qty: 60 | Fill #1

## 2019-10-12 MED FILL — ?HYDROCHLOROTHIAZIDE 25MG T: 25 | 30 days supply | Qty: 30 | Fill #2

## 2019-10-12 MED FILL — CYCLOBENZAPRINE 10 MG TAB: 10 | 30 days supply | Qty: 30 | Fill #1

## 2019-10-12 MED FILL — ?CETIRIZINE HCL 10 MG TABLE: 10 | 30 days supply | Qty: 30 | Fill #2

## 2019-10-12 MED FILL — POTASSIUM CHLORIDE ER 10 ME: 10 | 30 days supply | Qty: 30 | Fill #2

## 2019-10-12 MED FILL — ?CARVEDILOL 6.25 MG TABLET: 6.25 | 30 days supply | Qty: 60 | Fill #1

## 2019-10-12 MED FILL — traZODone HCL 100 MG TABS: 100 | 30 days supply | Qty: 30 | Fill #1

## 2019-10-23 IMAGING — US SOFT TISSUE ULTRASOUND HEAD/NECK
1 series · 14 of 19 positions shown · non-contrast
Comparison: None.

CLINICAL DATA: 48-year-old female with a history of neck swelling

EXAM:
ULTRASOUND OF HEAD/NECK SOFT TISSUES
TECHNIQUE: Ultrasound examination of the head and neck soft tissues was
performed in the area of clinical concern.

[Series 1: soft tissue ultrasound head/neck · 19 acquisitions, 14 frames shown]
[im 1/19]
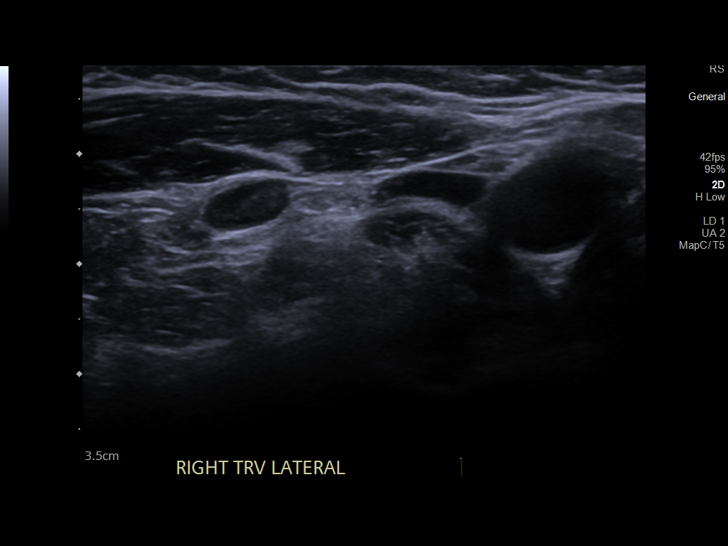
[im 3/19]
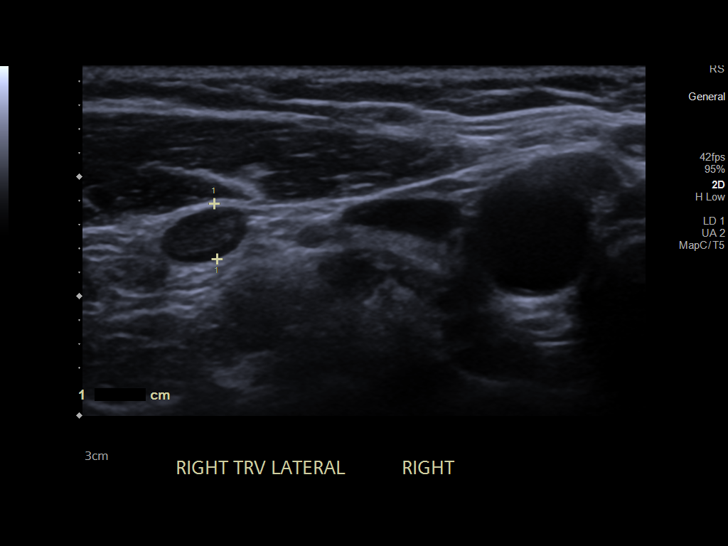
[im 4/19]
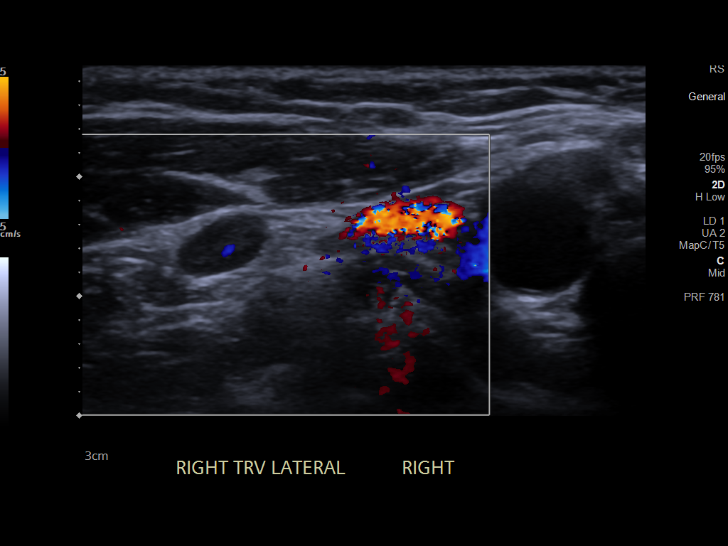
[im 5/19]
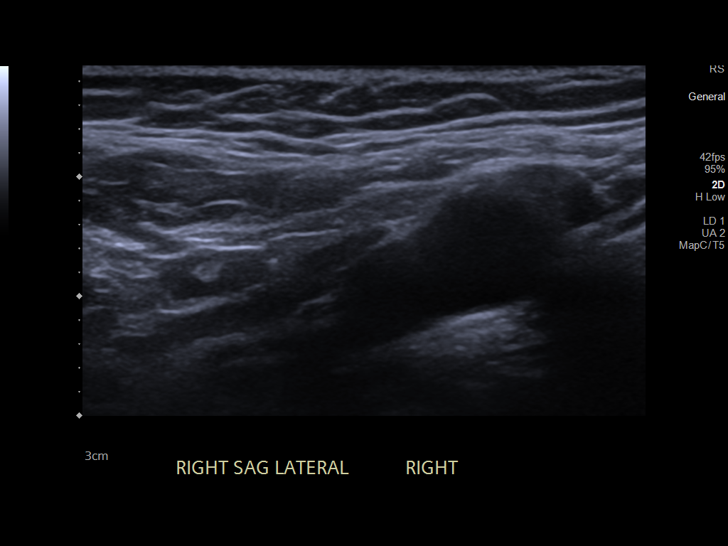
[im 7/19]
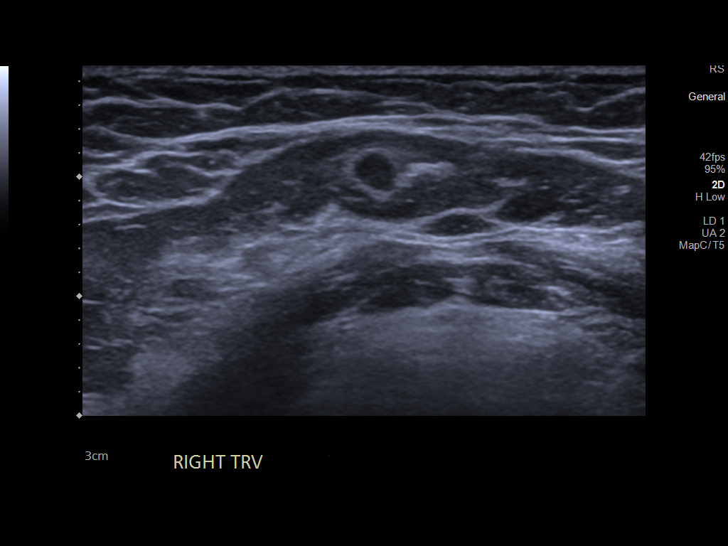
[im 8/19]
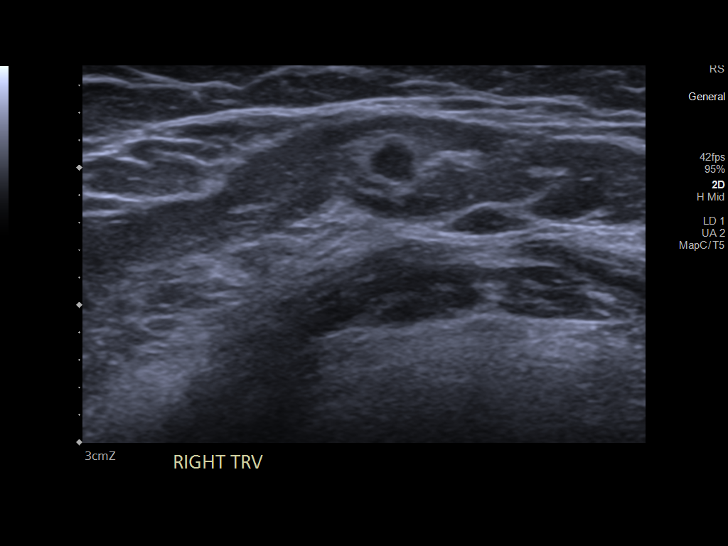
[im 9/19]
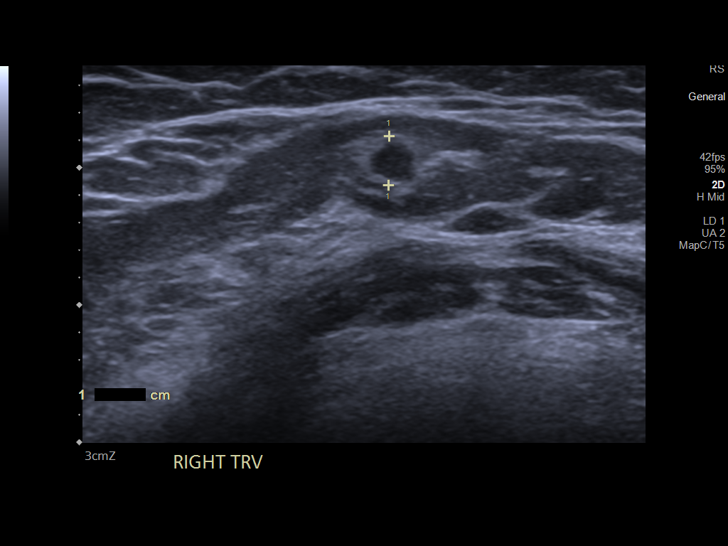
[im 11/19]
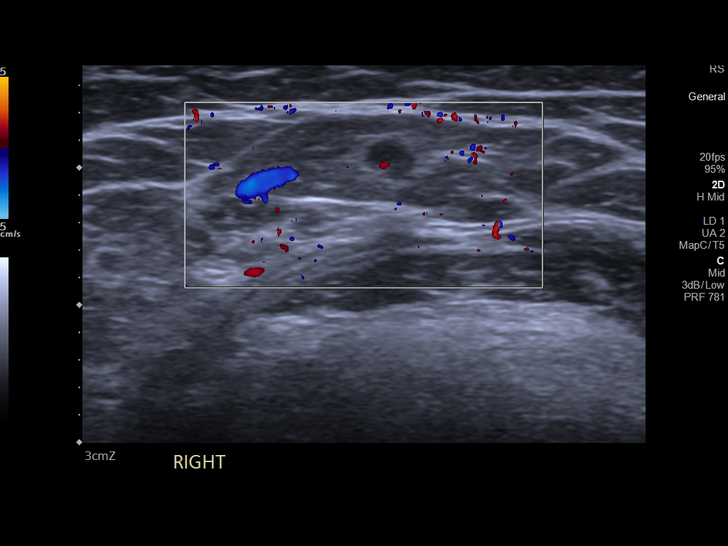
[im 12/19]
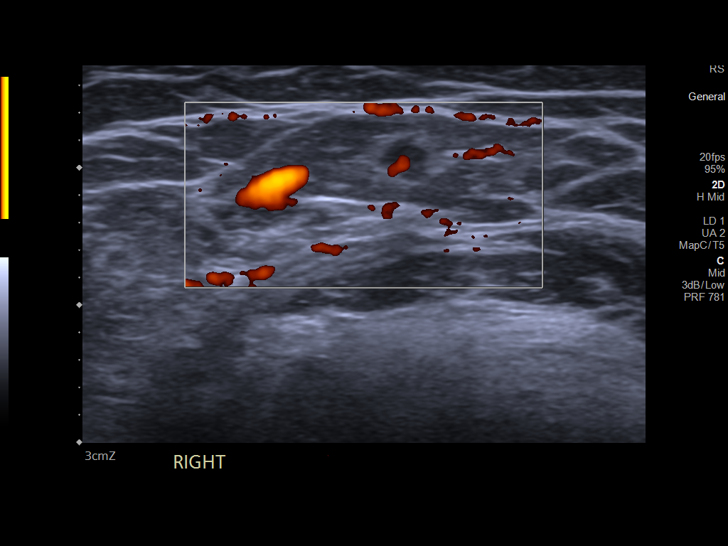
[im 13/19]
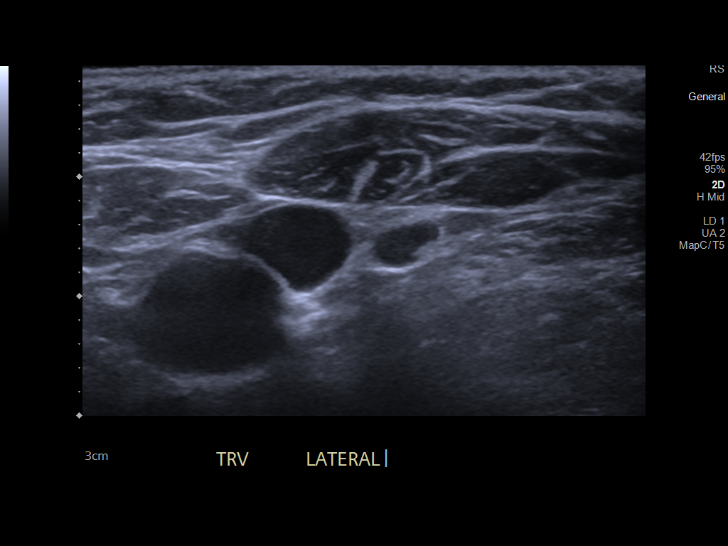
[im 15/19]
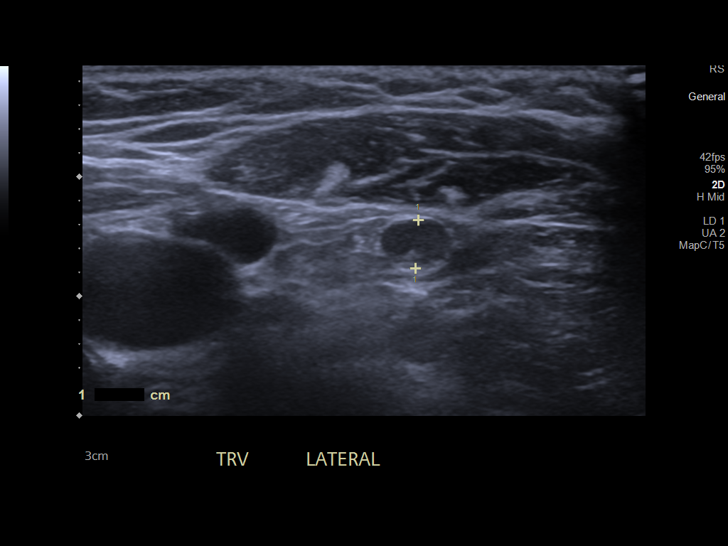
[im 16/19]
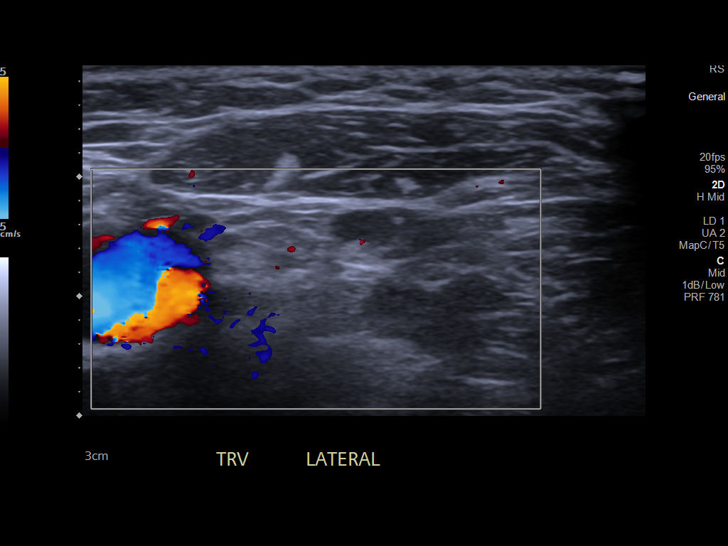
[im 17/19]
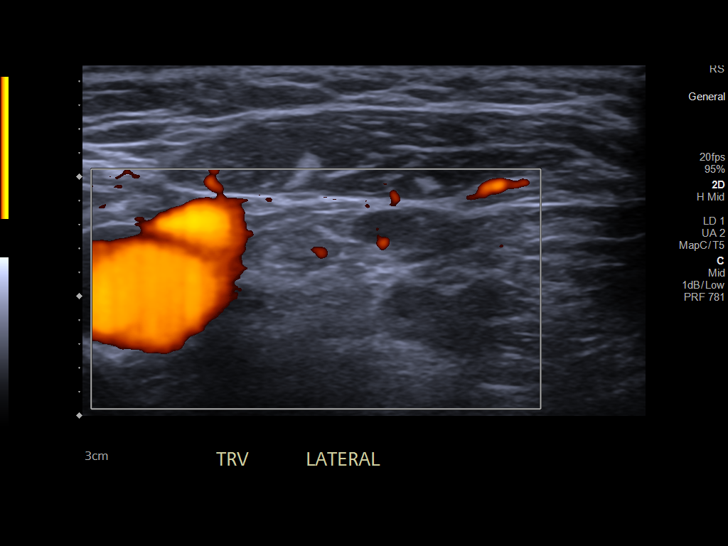
[im 19/19]
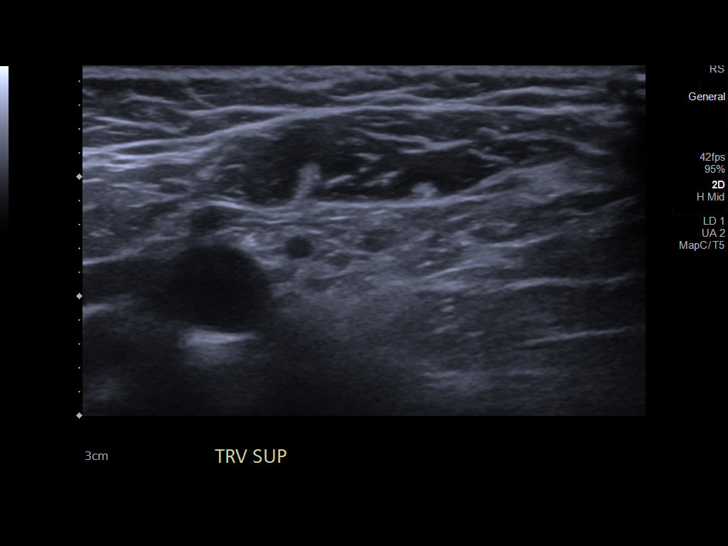

[14 of 19 positions shown; findings below may reference images not displayed]

FINDINGS: Grayscale and color duplex imaging performed in the region of
clinical concern.

No focal fluid identified.

Small hypoechoic nodules most compatible with lymph nodes. No soft
tissue lesion.
IMPRESSION: Typical appearing lymph nodes present in the region of clinical
concern.

## 2019-11-17 ENCOUNTER — Encounter: Payer: Self-pay | Admitting: Family Medicine

## 2019-11-17 ENCOUNTER — Other Ambulatory Visit: Payer: Self-pay

## 2019-11-17 ENCOUNTER — Ambulatory Visit: Payer: Self-pay | Attending: Family Medicine | Admitting: Family Medicine

## 2019-11-17 DIAGNOSIS — K2970 Gastritis, unspecified, without bleeding: Secondary | ICD-10-CM

## 2019-11-17 DIAGNOSIS — I10 Essential (primary) hypertension: Secondary | ICD-10-CM

## 2019-11-17 DIAGNOSIS — R0982 Postnasal drip: Secondary | ICD-10-CM

## 2019-11-17 DIAGNOSIS — K120 Recurrent oral aphthae: Secondary | ICD-10-CM

## 2019-11-17 DIAGNOSIS — G4709 Other insomnia: Secondary | ICD-10-CM

## 2019-11-17 DIAGNOSIS — E876 Hypokalemia: Secondary | ICD-10-CM

## 2019-11-17 DIAGNOSIS — K2990 Gastroduodenitis, unspecified, without bleeding: Secondary | ICD-10-CM

## 2019-11-17 DIAGNOSIS — E785 Hyperlipidemia, unspecified: Secondary | ICD-10-CM

## 2019-11-17 DIAGNOSIS — R232 Flushing: Secondary | ICD-10-CM

## 2019-11-17 MED ORDER — TRAZODONE HCL 150 MG PO TABS: 150.0000 mg | ORAL_TABLET | Freq: Every evening | ORAL | 6 refills | Status: DC | PRN

## 2019-11-17 MED ORDER — LIDOCAINE VISCOUS HCL 2 % MT SOLN: 15.0000 mL | Freq: Four times a day (QID) | OROMUCOSAL | 0 refills | Status: DC | PRN

## 2019-11-17 MED ORDER — HYDROCHLOROTHIAZIDE 25 MG PO TABS: 25.0000 mg | ORAL_TABLET | Freq: Every day | ORAL | 6 refills | Status: DC

## 2019-11-17 MED ORDER — ATORVASTATIN CALCIUM 20 MG PO TABS: 20.0000 mg | ORAL_TABLET | Freq: Every day | ORAL | 1 refills | Status: DC

## 2019-11-17 MED ORDER — CETIRIZINE HCL 10 MG PO TABS: 10.0000 mg | ORAL_TABLET | Freq: Every day | ORAL | 1 refills | Status: DC

## 2019-11-17 MED ORDER — CARVEDILOL 6.25 MG PO TABS: 6.2500 mg | ORAL_TABLET | Freq: Two times a day (BID) | ORAL | 6 refills | Status: DC

## 2019-11-17 MED ORDER — CLONIDINE HCL 0.1 MG PO TABS: 0.1000 mg | ORAL_TABLET | Freq: Every day | ORAL | 1 refills | Status: DC

## 2019-11-17 MED ORDER — PANTOPRAZOLE SODIUM 40 MG PO TBEC: 40.0000 mg | DELAYED_RELEASE_TABLET | Freq: Two times a day (BID) | ORAL | 6 refills | Status: DC

## 2019-11-17 MED ORDER — POTASSIUM CHLORIDE CRYS ER 10 MEQ PO TBCR: 10.0000 meq | EXTENDED_RELEASE_TABLET | Freq: Every day | ORAL | 1 refills | Status: DC

## 2019-11-17 MED FILL — TRAZODONE HCL 150 MG TABS: 150 | 30 days supply | Qty: 30 | Fill #0

## 2019-11-17 MED FILL — HYDROCHLOROTHIAZIDE 25 MG T: 25 | 30 days supply | Qty: 30 | Fill #0

## 2019-11-17 MED FILL — ?ATORVASTATIN 20 MG TABLET: 20 | 30 days supply | Qty: 30 | Fill #0

## 2019-11-17 MED FILL — PANTOPRAZOLE SOD DR 40 MG T: 40 | 30 days supply | Qty: 60 | Fill #0

## 2019-11-17 MED FILL — LIDOCAINE 2% VISCOUS SOLN: 2 | 1 days supply | Qty: 100 | Fill #0

## 2019-11-17 MED FILL — CETIRIZINE HCL 10 MG TABLET: 10 | 30 days supply | Qty: 30 | Fill #0

## 2019-11-17 MED FILL — ?CARVEDILOL 6.25 MG TABLET: 6.25 | 30 days supply | Qty: 60 | Fill #0

## 2019-11-17 MED FILL — POTASSIUM CHLORIDE ER 10 ME: 10 | 30 days supply | Qty: 30 | Fill #0

## 2019-11-17 MED FILL — cloNIDine HCL 0.1 MG TABS: 0.1 | 30 days supply | Qty: 30 | Fill #0

## 2019-11-17 NOTE — Progress Notes (Signed)
Subjective:  Patient ID: Alice Woodard, female    DOB: 1971/10/07  Age: 49 y.o. MRN: [id number]  CC: Hypertension   HPI Alice Woodard  isa 49 year old female with a history of hypertension, Achilles tendinitis, dyslipidemia, peptic ulcer secondary to H. pylori status post cauterization of bleeding vessels who presents today for a follow-up visit.  BP is elevated as she is yet to take her antihypertensives as she is fasting in anticipation of blood work. She complains of swelling in her eyelids, itching in her throat with associated sores in her mouth. Eyes sometimes itch. She sometimes has mucus in her throat and post nasal drip. Lisinopril has been discontinued at the last visit due to complaints of facial swelling. She also complains of persisting sores in her mouth; Viscous Lidocaine prescribed which she never picked up from the pharmacy. She complains of persisting epigastric pain despite using her PPI.  Denies hematochezia or hematemesis, nausea or vomiting. Followed by [facility] and is status post colonoscopy in 05/2019 which revealed sigmoid and rectal polyps, pathology revealed hyperplastic polyp and tubular adenoma. Past Medical History:  Diagnosis Date  . Anemia   . History of blood transfusion 04/17/2017   "anemic"  . Hyperlipidemia   . Hypertension     Past Surgical History:  Procedure Laterality Date  . APPENDECTOMY    . CHOLECYSTECTOMY OPEN    . ESOPHAGOGASTRODUODENOSCOPY N/A 04/18/2017   Procedure: ESOPHAGOGASTRODUODENOSCOPY (EGD);  Surgeon: Mauri Pole, MD;  Location: [facility] ENDOSCOPY;  Service: Endoscopy;  Laterality: N/A;  . UPPER GASTROINTESTINAL ENDOSCOPY  04/2017    Family History  Problem Relation Age of Onset  . Cancer - Other Mother        blood  . Colon cancer Neg Hx   . Esophageal cancer Neg Hx   . Rectal cancer Neg Hx   . Stomach cancer Neg Hx     Allergies  Allergen Reactions  . Amlodipine Rash    Outpatient Medications Prior to  Visit  Medication Sig Dispense Refill  . acetaminophen (TYLENOL) 325 MG tablet Take 2 tablets (650 mg total) by mouth every 6 (six) hours as needed for mild pain (or Fever >/= 101). 60 tablet 0  . carvedilol (COREG) 6.25 MG tablet Take 1 tablet (6.25 mg total) by mouth 2 (two) times daily with a meal. 60 tablet 3  . cetirizine (ZYRTEC) 10 MG tablet Take 1 tablet (10 mg total) by mouth daily. 90 tablet 1  . cloNIDine (CATAPRES) 0.1 MG tablet Take 1 tablet (0.1 mg total) by mouth at bedtime. For hotflashes 90 tablet 1  . cyclobenzaprine (FLEXERIL) 10 MG tablet Take 1 tablet (10 mg total) by mouth at bedtime. 30 tablet 1  . hydrochlorothiazide (HYDRODIURIL) 25 MG tablet Take 1 tablet (25 mg total) by mouth daily. 30 tablet 6  . hydrOXYzine (ATARAX/VISTARIL) 25 MG tablet Take 1 tablet (25 mg total) by mouth every 6 (six) hours. 12 tablet 0  . lidocaine (XYLOCAINE) 2 % solution Use as directed 15 mLs in the mouth or throat every 6 (six) hours as needed for mouth pain. 100 mL 0  . lisinopril-hydrochlorothiazide (ZESTORETIC) 20-12.5 MG tablet Take 1 tablet by mouth daily. 90 tablet 1  . pantoprazole (PROTONIX) 40 MG tablet Take 1 tablet (40 mg total) by mouth 2 (two) times daily. 60 tablet 3  . potassium chloride (KLOR-CON) 10 MEQ tablet Take 1 tablet (10 mEq total) by mouth daily. 90 tablet 1  . traZODone (DESYREL) 100 MG tablet  Take 1 tablet (100 mg total) by mouth at bedtime as needed for sleep. 30 tablet 3  . atorvastatin (LIPITOR) 20 MG tablet Take 1 tablet (20 mg total) by mouth daily. (Patient not taking: Reported on 08/17/2019) 90 tablet 1  . benzonatate (TESSALON) 100 MG capsule Take 1 capsule (100 mg total) by mouth 3 (three) times daily. (Patient not taking: Reported on 11/17/2019) 30 capsule 0  . dicyclomine (BENTYL) 10 MG capsule Take 1 capsule (10 mg total) by mouth 3 (three) times daily as needed (abdominal discomfort and cramping). (Patient not taking: Reported on 05/20/2019) 270 capsule 3  .  zolpidem (AMBIEN) 5 MG tablet Take 1 tablet (5 mg total) by mouth at bedtime as needed for sleep. (Patient not taking: Reported on 08/17/2019) 6 tablet 0   Facility-Administered Medications Prior to Visit  Medication Dose Route Frequency Provider Last Rate Last Admin  . 0.9 %  sodium chloride infusion  500 mL Intravenous Once Nandigam, Kavitha V, MD         ROS Review of Systems  Constitutional: Negative for activity change, appetite change and fatigue.  HENT: Negative for congestion, sinus pressure and sore throat.   Eyes: Positive for itching. Negative for visual disturbance.  Respiratory: Negative for cough, chest tightness, shortness of breath and wheezing.   Cardiovascular: Negative for chest pain and palpitations.  Gastrointestinal: Positive for abdominal pain. Negative for abdominal distention and constipation.  Endocrine: Negative for polydipsia.  Genitourinary: Negative for dysuria and frequency.  Musculoskeletal: Negative for arthralgias and back pain.  Skin: Negative for rash.  Neurological: Negative for tremors, light-headedness and numbness.  Hematological: Does not bruise/bleed easily.  Psychiatric/Behavioral: Negative for agitation and behavioral problems.    Objective:  BP (!) 168/91   Pulse 94   Temp 98 F (36.7 C) (Oral)   Ht 4\' 10"  (1.473 m)   Wt 129 lb (58.5 kg)   LMP 11/15/2014   SpO2 99%   BMI 26.96 kg/m   BP/Weight 11/17/2019 08/17/2019 [id number]  Systolic BP [id number]  Diastolic BP 91 88 [id number]  Wt. (Lbs) 129 126 -  BMI 26.96 26.33 -      Physical Exam Constitutional:      Appearance: She is well-developed.  HENT:     Mouth/Throat:     Comments: Aphthous ulcers on lateral aspect of tongue Enlarged papillae and posterior aspect of tongue Eyes:     Extraocular Movements: Extraocular movements intact.     Comments: Normal appearance of eyelids  Neck:     Vascular: No JVD.  Cardiovascular:     Rate and Rhythm: Normal rate.     Heart sounds:  Normal heart sounds. No murmur.  Pulmonary:     Effort: Pulmonary effort is normal.     Breath sounds: Normal breath sounds. No wheezing or rales.  Chest:     Chest wall: No tenderness.  Abdominal:     General: Bowel sounds are normal. There is no distension.     Palpations: Abdomen is soft. There is no mass.     Tenderness: There is no abdominal tenderness.  Musculoskeletal:        General: Normal range of motion.     Right lower leg: No edema.     Left lower leg: No edema.  Neurological:     Mental Status: She is alert and oriented to person, place, and time.  Psychiatric:        Mood and Affect: Mood normal.  CMP Latest Ref Rng & Units 08/17/2019 07/03/2019 01/19/2019  Glucose 65 - 99 mg/dL 85 176(H) 73  BUN 6 - 24 mg/dL 9 6 9   Creatinine 0.57 - 1.00 mg/dL 0.75 0.79 0.78  Sodium 134 - 144 mmol/L 141 141 144  Potassium 3.5 - 5.2 mmol/L 3.5 3.0(L) 4.0  Chloride 96 - 106 mmol/L 102 105 101  CO2 20 - 29 mmol/L 25 24 27   Calcium 8.7 - 10.2 mg/dL 9.5 9.1 9.5  Total Protein 6.0 - 8.5 g/dL - - 7.3  Total Bilirubin 0.0 - 1.2 mg/dL - - 0.8  Alkaline Phos 39 - 117 IU/L - - 182(H)  AST 0 - 40 IU/L - - 20  ALT 0 - 32 IU/L - - 14    Lipid Panel     Component Value Date/Time   CHOL 152 01/19/2019 1435   TRIG 226 (H) 01/19/2019 1435   HDL 35 (L) 01/19/2019 1435   CHOLHDL 4.3 01/19/2019 1435   CHOLHDL 4.8 10/26/2016 0933   VLDL 22 10/26/2016 0933   LDLCALC 72 01/19/2019 1435    CBC    Component Value Date/Time   WBC 10.3 07/03/2019 1633   RBC 4.28 07/03/2019 1633   HGB 13.0 07/03/2019 1633   HGB 11.2 05/30/2017 0903   HCT 37.6 07/03/2019 1633   HCT 34.6 05/30/2017 0903   PLT 320 07/03/2019 1633   PLT 329 05/30/2017 0903   MCV 87.9 07/03/2019 1633   MCV 91 05/30/2017 0903   MCH 30.4 07/03/2019 1633   MCHC 34.6 07/03/2019 1633   RDW 13.0 07/03/2019 1633   RDW 14.0 05/30/2017 0903   LYMPHSABS 1.9 05/30/2017 0903   MONOABS 0.6 12/29/2015 1126   EOSABS 0.2 05/30/2017  0903   BASOSABS 0.0 05/30/2017 0903    Lab Results  Component Value Date   HGBA1C 5.0 12/29/2015    Assessment & Plan:   1. Dyslipidemia Controlled Lipid panel sent off and will adjust regimen accordingly Continue low-cholesterol diet - atorvastatin (LIPITOR) 20 MG tablet; Take 1 tablet (20 mg total) by mouth daily.  Dispense: 90 tablet; Refill: 1  2. Essential hypertension, benign Elevated She is yet to take antihypertensive as she is fasting in anticipation of labs Counseled on blood pressure goal of less than 130/80, low-sodium, DASH diet, medication compliance, 150 minutes of moderate intensity exercise per week. Discussed medication compliance, adverse effects. - carvedilol (COREG) 6.25 MG tablet; Take 1 tablet (6.25 mg total) by mouth 2 (two) times daily with a meal.  Dispense: 60 tablet; Refill: 6 - hydrochlorothiazide (HYDRODIURIL) 25 MG tablet; Take 1 tablet (25 mg total) by mouth daily.  Dispense: 30 tablet; Refill: 6 - Lipid panel - Basic Metabolic Panel  3. Post-nasal drip - cetirizine (ZYRTEC) 10 MG tablet; Take 1 tablet (10 mg total) by mouth daily.  Dispense: 90 tablet; Refill: 1  4. Hot flashes Stable - cloNIDine (CATAPRES) 0.1 MG tablet; Take 1 tablet (0.1 mg total) by mouth at bedtime. For hotflashes  Dispense: 90 tablet; Refill: 1  5. Gastritis and gastroduodenitis Uncontrolled Advised to reach out to her gastroenterologist for sooner appointment - pantoprazole (PROTONIX) 40 MG tablet; Take 1 tablet (40 mg total) by mouth 2 (two) times daily.  Dispense: 60 tablet; Refill: 6  6. Other insomnia Uncontrolled Trazodone dose increased - traZODone (DESYREL) 150 MG tablet; Take 1 tablet (150 mg total) by mouth at bedtime as needed for sleep.  Dispense: 30 tablet; Refill: 6  7. Hypokalemia - potassium chloride (KLOR-CON) 10  MEQ tablet; Take 1 tablet (10 mEq total) by mouth daily.  Dispense: 90 tablet; Refill: 1  8. Aphthous ulcer Uncontrolled as she never  picked up Viscous Lidocaine Advised to obtain OTC vitamin C Refilled viscous lidocaine We will refer to dentist to evaluate hypertrophic papillae - lidocaine (XYLOCAINE) 2 % solution; Use as directed 15 mLs in the mouth or throat every 6 (six) hours as needed for mouth pain.  Dispense: 100 mL; Refill: 0 - Ambulatory referral to Dentistry  Return in about 6 months (around 05/16/2020) for Medical conditions.      Charlott Rakes, MD, FAAFP. [facility] and [facility] [place], [place]   11/17/2019, 10:10 AM

## 2019-11-17 NOTE — Progress Notes (Signed)
Swelling in legs and eyelids.  States she has an ulcer in her mouth.

## 2019-11-18 LAB — LIPID PANEL
Chol/HDL Ratio: 6.4 ratio — ABNORMAL HIGH (ref 0.0–4.4)
Cholesterol, Total: 223 mg/dL — ABNORMAL HIGH (ref 100–199)
HDL: 35 mg/dL — ABNORMAL LOW (ref >39)
LDL Chol Calc (NIH): 140 mg/dL — ABNORMAL HIGH (ref 0–99)
Triglycerides: 264 mg/dL — ABNORMAL HIGH (ref 0–149)
VLDL Cholesterol Cal: 48 mg/dL — ABNORMAL HIGH (ref 5–40)

## 2019-11-18 LAB — BASIC METABOLIC PANEL
BUN/Creatinine Ratio: 10 (ref 9–23)
BUN: 8 mg/dL (ref 6–24)
CO2: 27 mmol/L (ref 20–29)
Calcium: 9.4 mg/dL (ref 8.7–10.2)
Chloride: 99 mmol/L (ref 96–106)
Creatinine, Ser: 0.77 mg/dL (ref 0.57–1.00)
GFR calc Af Amer: 104 mL/min/{1.73_m2} (ref >59)
GFR calc non Af Amer: 90 mL/min/{1.73_m2} (ref >59)
Glucose: 87 mg/dL (ref 65–99)
Potassium: 3.5 mmol/L (ref 3.5–5.2)
Sodium: 140 mmol/L (ref 134–144)

## 2019-11-20 ENCOUNTER — Telehealth: Payer: Self-pay

## 2019-11-20 NOTE — Telephone Encounter (Signed)
-----   Message from Charlott Rakes, MD sent at 11/18/2019 12:30 PM EST ----- Cholesterol is elevated.  Please encourage adherence to Lipitor and low-cholesterol diet.

## 2019-11-20 NOTE — Telephone Encounter (Signed)
Patient name and DOB has been verified Patient was informed of lab results. Patient had no questions.  

## 2019-12-11 MED FILL — ?ATORVASTATIN 20 MG TABLET: 20 | 30 days supply | Qty: 30 | Fill #1

## 2019-12-11 MED FILL — cloNIDine HCL 0.1 MG TABS: 0.1 | 30 days supply | Qty: 30 | Fill #1

## 2019-12-11 MED FILL — ?CARVEDILOL 6.25 MG TABLET: 6.25 | 30 days supply | Qty: 60 | Fill #1

## 2019-12-11 MED FILL — ?HYDROCHLOROTHIAZIDE 25MG T: 25 | 30 days supply | Qty: 30 | Fill #1

## 2019-12-15 ENCOUNTER — Ambulatory Visit: Payer: Self-pay | Attending: Family Medicine

## 2019-12-15 ENCOUNTER — Other Ambulatory Visit: Payer: Self-pay

## 2019-12-24 ENCOUNTER — Telehealth: Payer: Self-pay | Admitting: Family Medicine

## 2019-12-24 NOTE — Telephone Encounter (Signed)
Pt was sent a letter from financial dept. Inform them, that the application they submitted was incomplete, since they were missing some documentation at the time of the appointment, Pt need to reschedule and resubmit all new papers and application for [employer] and [employer], P.S. old documents has been sent back by mail to the Pt and Pt. need to make a new appt. 

## 2020-01-14 MED FILL — ?CARVEDILOL 6.25 MG TABLET: 6.25 | 30 days supply | Qty: 60 | Fill #2

## 2020-01-14 MED FILL — ?ATORVASTATIN 20 MG TABLET: 20 | 30 days supply | Qty: 30 | Fill #2

## 2020-01-14 MED FILL — ?HYDROCHLOROTHIAZIDE 25MG T: 25 | 30 days supply | Qty: 30 | Fill #2

## 2020-01-14 MED FILL — ?CETIRIZINE HCL 10 MG TABLE: 10 | 30 days supply | Qty: 30 | Fill #1

## 2020-01-14 MED FILL — cloNIDine HCL 0.1 MG TABS: 0.1 | 30 days supply | Qty: 30 | Fill #2

## 2020-02-15 MED FILL — ?cloNIDine HCL 0.1 MG TABS: 0.1 | 30 days supply | Qty: 30 | Fill #3

## 2020-02-15 MED FILL — ?CARVEDILOL 6.25 MG TABLET: 6.25 | 30 days supply | Qty: 60 | Fill #3

## 2020-02-15 MED FILL — ?HYDROCHLOROTHIAZIDE 25MG T: 25 | 30 days supply | Qty: 30 | Fill #3

## 2020-02-15 MED FILL — ?ATORVASTATIN 20 MG TABLET: 20 | 30 days supply | Qty: 30 | Fill #3

## 2020-02-15 MED FILL — ?PANTOPRAZOLE SO DR 40MG TA: 40 | 30 days supply | Qty: 60 | Fill #1

## 2020-02-26 ENCOUNTER — Ambulatory Visit: Payer: Self-pay

## 2020-02-26 ENCOUNTER — Other Ambulatory Visit: Payer: Self-pay

## 2020-03-09 ENCOUNTER — Encounter: Payer: Self-pay | Admitting: Family Medicine

## 2020-03-09 ENCOUNTER — Ambulatory Visit: Payer: Medicaid Other | Attending: Family Medicine | Admitting: Family Medicine

## 2020-03-09 ENCOUNTER — Other Ambulatory Visit: Payer: Self-pay

## 2020-03-09 DIAGNOSIS — K2990 Gastroduodenitis, unspecified, without bleeding: Secondary | ICD-10-CM

## 2020-03-09 DIAGNOSIS — K2970 Gastritis, unspecified, without bleeding: Secondary | ICD-10-CM

## 2020-03-09 NOTE — Progress Notes (Signed)
Virtual Visit via Telephone Note  I connected with Alice Woodard, on 03/09/2020 at 11:24 AM by telephone due to the COVID-19 pandemic and verified that I am speaking with the correct person using two identifiers.   Consent: I discussed the limitations, risks, security and privacy concerns of performing an evaluation and management service by telephone and the availability of in person appointments. I also discussed with the patient that there may be a patient responsible charge related to this service. The patient expressed understanding and agreed to proceed.   Location of Patient: Home  Location of Provider: Clinic   Persons participating in Telemedicine visit: Alice Woodard Interpreter [id number] - Yog Dr. Margarita Rana     History of Present Illness: Alice Woodard isa 49 year old female with a history of hypertension, Achilles tendinitis, dyslipidemia, peptic ulcer secondary to H. pylori status post cauterization of bleeding vessels who presents today for a follow-up visit.  She complains about abdominal burning 'which feels like gastritis pain' and has been on for a week.  Review of her chart indicates she has complained of this pain chronically and she remains on Protonix which she states she has been adherent to it. She denies nausea, vomiting, hematochezia or hematemesis.   She would like to have her immigration paperwork completed to exempt her from the citizenship test for naturalization due to her inability to speak [place]. Past Medical History:  Diagnosis Date  . Anemia   . History of blood transfusion 04/17/2017   "anemic"  . Hyperlipidemia   . Hypertension    Allergies  Allergen Reactions  . Amlodipine Rash    Current Outpatient Medications on File Prior to Visit  Medication Sig Dispense Refill  . acetaminophen (TYLENOL) 325 MG tablet Take 2 tablets (650 mg total) by mouth every 6 (six) hours as needed for mild pain (or Fever >/= 101). 60  tablet 0  . atorvastatin (LIPITOR) 20 MG tablet Take 1 tablet (20 mg total) by mouth daily. 90 tablet 1  . carvedilol (COREG) 6.25 MG tablet Take 1 tablet (6.25 mg total) by mouth 2 (two) times daily with a meal. 60 tablet 6  . cetirizine (ZYRTEC) 10 MG tablet Take 1 tablet (10 mg total) by mouth daily. 90 tablet 1  . cloNIDine (CATAPRES) 0.1 MG tablet Take 1 tablet (0.1 mg total) by mouth at bedtime. For hotflashes 90 tablet 1  . cyclobenzaprine (FLEXERIL) 10 MG tablet Take 1 tablet (10 mg total) by mouth at bedtime. 30 tablet 1  . hydrochlorothiazide (HYDRODIURIL) 25 MG tablet Take 1 tablet (25 mg total) by mouth daily. 30 tablet 6  . hydrOXYzine (ATARAX/VISTARIL) 25 MG tablet Take 1 tablet (25 mg total) by mouth every 6 (six) hours. 12 tablet 0  . lidocaine (XYLOCAINE) 2 % solution Use as directed 15 mLs in the mouth or throat every 6 (six) hours as needed for mouth pain. 100 mL 0  . pantoprazole (PROTONIX) 40 MG tablet Take 1 tablet (40 mg total) by mouth 2 (two) times daily. 60 tablet 6  . potassium chloride (KLOR-CON) 10 MEQ tablet Take 1 tablet (10 mEq total) by mouth daily. 90 tablet 1  . traZODone (DESYREL) 150 MG tablet Take 1 tablet (150 mg total) by mouth at bedtime as needed for sleep. 30 tablet 6  . benzonatate (TESSALON) 100 MG capsule Take 1 capsule (100 mg total) by mouth 3 (three) times daily. (Patient not taking: Reported on 11/17/2019) 30 capsule 0  . dicyclomine (BENTYL) 10 MG  capsule Take 1 capsule (10 mg total) by mouth 3 (three) times daily as needed (abdominal discomfort and cramping). (Patient not taking: Reported on 05/20/2019) 270 capsule 3  . zolpidem (AMBIEN) 5 MG tablet Take 1 tablet (5 mg total) by mouth at bedtime as needed for sleep. (Patient not taking: Reported on 08/17/2019) 6 tablet 0   Current Facility-Administered Medications on File Prior to Visit  Medication Dose Route Frequency Provider Last Rate Last Admin  . 0.9 %  sodium chloride infusion  500 mL Intravenous  Once Nandigam, Venia Minks, MD        Observations/Objective: Awake, alert, oriented x3 Not in acute distress  Assessment and Plan: 1. Gastritis and gastroduodenitis Uncontrolled Previous history of H. pylori gastritis Continue Protonix Will refer back to GI - Ambulatory referral to Gastroenterology   Follow Up Instructions: Schedule appointment in person for completion of immigration paperwork   I discussed the assessment and treatment plan with the patient. The patient was provided an opportunity to ask questions and all were answered. The patient agreed with the plan and demonstrated an understanding of the instructions.   The patient was advised to call back or seek an in-person evaluation if the symptoms worsen or if the condition fails to improve as anticipated.     I provided 12 minutes total of non-face-to-face time during this encounter including median intraservice time, reviewing previous notes, investigations, ordering medications, medical decision making, coordinating care and patient verbalized understanding at the end of the visit.     Charlott Rakes, MD, FAAFP. [facility] and [facility] [place], [place]   03/09/2020, 11:24 AM

## 2020-03-09 NOTE — Progress Notes (Signed)
Having abdominal symptoms.

## 2020-03-18 MED FILL — HYDROCHLOROTHIAZIDE 25 MG T: 25 | 30 days supply | Qty: 30 | Fill #4

## 2020-03-18 MED FILL — ?cloNIDine HCL 0.1 MG TABS: 0.1 | 30 days supply | Qty: 30 | Fill #4

## 2020-03-18 MED FILL — ?ATORVASTATIN 20 MG TABLET: 20 | 30 days supply | Qty: 30 | Fill #4

## 2020-03-29 MED FILL — ?TRAZODONE HCL 150MG TAB: 150 | 30 days supply | Qty: 30 | Fill #1

## 2020-03-29 MED FILL — ?PANTOPRAZOLE SO DR 40MG TA: 40 | 30 days supply | Qty: 60 | Fill #2

## 2020-04-15 MED FILL — ?cloNIDine HCL 0.1 MG TABS: 0.1 | 30 days supply | Qty: 30 | Fill #5

## 2020-04-15 MED FILL — ?ATORVASTATIN 20 MG TABLET: 20 | 30 days supply | Qty: 30 | Fill #5

## 2020-04-21 ENCOUNTER — Ambulatory Visit (INDEPENDENT_AMBULATORY_CARE_PROVIDER_SITE_OTHER): Payer: Self-pay | Admitting: Gastroenterology

## 2020-04-21 ENCOUNTER — Encounter: Payer: Self-pay | Admitting: Gastroenterology

## 2020-04-21 ENCOUNTER — Other Ambulatory Visit: Payer: Self-pay | Admitting: Gastroenterology

## 2020-04-21 VITALS — BP 122/74 | HR 64 | Ht 58.25 in | Wt 131.4 lb

## 2020-04-21 DIAGNOSIS — K5904 Chronic idiopathic constipation: Secondary | ICD-10-CM

## 2020-04-21 DIAGNOSIS — R1011 Right upper quadrant pain: Secondary | ICD-10-CM

## 2020-04-21 DIAGNOSIS — K219 Gastro-esophageal reflux disease without esophagitis: Secondary | ICD-10-CM

## 2020-04-21 DIAGNOSIS — K2990 Gastroduodenitis, unspecified, without bleeding: Secondary | ICD-10-CM

## 2020-04-21 DIAGNOSIS — K2970 Gastritis, unspecified, without bleeding: Secondary | ICD-10-CM

## 2020-04-21 MED ORDER — HYOSCYAMINE SULFATE SL 0.125 MG SL SUBL: 0.1250 mg | SUBLINGUAL_TABLET | Freq: Two times a day (BID) | SUBLINGUAL | 11 refills | Status: DC | PRN

## 2020-04-21 MED ORDER — POLYETHYLENE GLYCOL 3350 17 GM/SCOOP PO POWD: ORAL | 0 refills | Status: DC

## 2020-04-21 MED ORDER — PANTOPRAZOLE SODIUM 40 MG PO TBEC: 40.0000 mg | DELAYED_RELEASE_TABLET | Freq: Two times a day (BID) | ORAL | 11 refills | Status: DC

## 2020-04-21 NOTE — Progress Notes (Signed)
Alice Woodard    [id number]    August 17, 1971  Primary Care Physician:Newlin, Charlane Ferretti, MD  Referring Physician: Charlott Rakes, MD [place],  [place] [id number]   Chief complaint: Nausea, abdominal pain, constipation  HPI:  49 year old female here for follow-up visit accompanied by language interpreter and her daughter with complaints of intermittent right upper quadrant discomfort  RUQ abd pain, worse on rainy/foggy days.  No association with activity, dietary changes or bowel habits.  S/p cholecystectomy   EGD 04/18/2017: Showed multiple duodenal ulcers, H. pylori positive.  She was treated with antibiotics for H. pylori  H.pylori stool Antigen negative 06/26/2017, confirmed eradication.  Colonoscopy May 20, 2019: 5 sessile diminutive polyps removed, only one of them was tubular adenoma the rest hyperplastic.  Recommended recall colonoscopy in 7 years  CT abdomen pelvis with contrast December 07, 2017 for abdominal pain:  No acute findings.  Altered profusion and atrophy of left lobe of liver attributed to possible old vascular insult otherwise unremarkable exam  Denies any melena, vomiting, dysphagia, or rectal bleeding or recent change in bowel habits.  No weight loss or decreased appetite.  Outpatient Encounter Medications as of 04/21/2020  Medication Sig  . atorvastatin (LIPITOR) 20 MG tablet Take 1 tablet (20 mg total) by mouth daily.  . carvedilol (COREG) 6.25 MG tablet Take 1 tablet (6.25 mg total) by mouth 2 (two) times daily with a meal.  . cetirizine (ZYRTEC) 10 MG tablet Take 1 tablet (10 mg total) by mouth daily.  . cloNIDine (CATAPRES) 0.1 MG tablet Take 1 tablet (0.1 mg total) by mouth at bedtime. For hotflashes  . cyclobenzaprine (FLEXERIL) 10 MG tablet Take 1 tablet (10 mg total) by mouth at bedtime.  . dicyclomine (BENTYL) 10 MG capsule Take 1 capsule (10 mg total) by mouth 3 (three) times daily as needed (abdominal discomfort and  cramping).  . hydrochlorothiazide (HYDRODIURIL) 25 MG tablet Take 1 tablet (25 mg total) by mouth daily.  . pantoprazole (PROTONIX) 40 MG tablet Take 1 tablet (40 mg total) by mouth 2 (two) times daily.  . potassium chloride (KLOR-CON) 10 MEQ tablet Take 1 tablet (10 mEq total) by mouth daily.  . traZODone (DESYREL) 150 MG tablet Take 1 tablet (150 mg total) by mouth at bedtime as needed for sleep.  Marland Kitchen zolpidem (AMBIEN) 5 MG tablet Take 1 tablet (5 mg total) by mouth at bedtime as needed for sleep.  . benzonatate (TESSALON) 100 MG capsule Take 1 capsule (100 mg total) by mouth 3 (three) times daily. (Patient not taking: Reported on 11/17/2019)  . [DISCONTINUED] acetaminophen (TYLENOL) 325 MG tablet Take 2 tablets (650 mg total) by mouth every 6 (six) hours as needed for mild pain (or Fever >/= 101).  . [DISCONTINUED] hydrOXYzine (ATARAX/VISTARIL) 25 MG tablet Take 1 tablet (25 mg total) by mouth every 6 (six) hours.  . [DISCONTINUED] lidocaine (XYLOCAINE) 2 % solution Use as directed 15 mLs in the mouth or throat every 6 (six) hours as needed for mouth pain.   Facility-Administered Encounter Medications as of 04/21/2020  Medication  . 0.9 %  sodium chloride infusion    Allergies as of 04/21/2020 - Review Complete 04/21/2020  Allergen Reaction Noted  . Amlodipine Rash 04/17/2017    Past Medical History:  Diagnosis Date  . Anemia   . History of blood transfusion 04/17/2017   "anemic"  . Hyperlipidemia   . Hypertension     Past Surgical History:  Procedure Laterality Date  . APPENDECTOMY    . CHOLECYSTECTOMY OPEN    . ESOPHAGOGASTRODUODENOSCOPY N/A 04/18/2017   Procedure: ESOPHAGOGASTRODUODENOSCOPY (EGD);  Surgeon: Mauri Pole, MD;  Location[facility]COPY;  Service: Endoscopy;  Laterality: N/A;  . UPPER GASTROINTESTINAL ENDOSCOPY  04/2017    Family History  Problem Relation Age of Onset  . Cancer - Other Mother        blood  . Colon cancer Neg Hx   . Esophageal cancer Neg  Hx   . Rectal cancer Neg Hx   . Stomach cancer Neg Hx     Social History   Socioeconomic History  . Marital status: Single    Spouse name: Not on file  . Number of children: 2  . Years of education: Not on file  . Highest education level: Not on file  Occupational History  . Not on file  Tobacco Use  . Smoking status: Never Smoker  . Smokeless tobacco: Never Used  Vaping Use  . Vaping Use: Never used  Substance and Sexual Activity  . Alcohol use: No  . Drug use: No  . Sexual activity: Not on file  Other Topics Concern  . Not on file  Social History Narrative  . Not on file   Social Determinants of Health   Financial Resource Strain:   . Difficulty of Paying Living Expenses:   Food Insecurity:   . Worried About Charity fundraiser in the Last Year:   . Arboriculturist in the Last Year:   Transportation Needs:   . Film/video editor (Medical):   Marland Kitchen Lack of Transportation (Non-Medical):   Physical Activity:   . Days of Exercise per Week:   . Minutes of Exercise per Session:   Stress:   . Feeling of Stress :   Social Connections:   . Frequency of Communication with Friends and Family:   . Frequency of Social Gatherings with Friends and Family:   . Attends Religious Services:   . Active Member of Clubs or Organizations:   . Attends Archivist Meetings:   Marland Kitchen Marital Status:   Intimate Partner Violence:   . Fear of Current or Ex-Partner:   . Emotionally Abused:   Marland Kitchen Physically Abused:   . Sexually Abused:       Review of systems: Positive for insomnia All other review of systems negative except as mentioned in the HPI.   Physical Exam: Vitals:   04/21/20 1320  BP: 122/74  Pulse: 64   Body mass index is 27.22 kg/m. Gen:      No acute distress HEENT:   sclera anicteric Abd:       soft, non-tender; no palpable masses, no distension Ext:    No edema Neuro: alert and oriented x 3 Psych: Flat affect  Data Reviewed:  Reviewed labs,  radiology imaging, old records and pertinent past GI work up   Assessment and Plan/Recommendations:  49 year old female with history of H. pylori associated gastroduodenal ulcer s/p treatment with confirmed eradication, chronic idiopathic constipation with complaints of right upper quadrant abdominal pain  GERD: Continue Protonix 40 mg daily Antireflux measures  Chronic idiopathic constipation: Continue Benefiber and MiraLAX as needed with goal 1-2 soft bowel movements daily Increase dietary fiber and water intake  History of adenomatous colon polyps, due for recall colonoscopy July 2027  Unclear etiology for right upper quadrant abdominal pain, had recent imaging and endoscopic evaluation with no acute abnormality.  Could be secondary to  adhesions s/p cholecystectomy and also has atrophic left lobe of liver likely secondary to vascular insult possibly at the time of cholecystectomy Hyoscyamine 125 mcg sublingual twice daily as needed for severe discomfort Start dicyclomine  Complains of insomnia and has flat affect, unable to evaluate further during this visit due to language barrier despite using language interpreter.  Patient provided information to contact behavioral health. Both patient and her daughter agreed with the plan.  Return in 1 year or sooner if needed  The patient was provided an opportunity to ask questions and all were answered. The patient agreed with the plan and demonstrated an understanding of the instructions.  Damaris Hippo , MD    CC: Charlott Rakes, MD

## 2020-04-21 NOTE — Patient Instructions (Addendum)
If you are age 51 or older, your body mass index should be between 23-30. Your Body mass index is 27.22 kg/m. If this is out of the aforementioned range listed, please consider follow up with your Primary Care Provider.  If you are age 74 or younger, your body mass index should be between 19-25. Your Body mass index is 27.22 kg/m. If this is out of the aformentioned range listed, please consider follow up with your Primary Care Provider.   CONTINUE: Protonix 40mg  one tablet twice daily  Please purchase the following medications over the counter and take as directed:  START: Miralax 1 capful twice daily as needed.  We have sent the following medications to your pharmacy for you to pick up at your convenience:  START: Levsin 0.125mg  sublingual tablet (under the tongue) twice daily as needed.  STOP: dicyclomine  You will follow up with our office in 1 year (June 2022).  I appreciate the opportunity to care for you. Thank you for choosing me and [facility],  Dr. Harl Bowie

## 2020-04-22 ENCOUNTER — Encounter: Payer: Self-pay | Admitting: Gastroenterology

## 2020-04-22 MED FILL — ?PANTOPRAZOLE SO DR 40MG TA: 40 | 30 days supply | Qty: 60 | Fill #3

## 2020-05-18 ENCOUNTER — Other Ambulatory Visit: Payer: Self-pay | Admitting: Family Medicine

## 2020-05-18 DIAGNOSIS — E785 Hyperlipidemia, unspecified: Secondary | ICD-10-CM

## 2020-05-18 MED FILL — HYDROCHLOROTHIAZIDE 25 MG T: 25 | 30 days supply | Qty: 30 | Fill #6

## 2020-05-19 MED FILL — ATORVASTATIN CALCIUM 20 MG: 20 | 30 days supply | Qty: 30 | Fill #0

## 2020-05-30 MED FILL — ?TRAZODONE HCL 150MG TAB: 150 | 30 days supply | Qty: 30 | Fill #2

## 2020-05-30 MED FILL — ?PANTOPRAZOLE SODI DR 40MGT: 40 | 30 days supply | Qty: 60 | Fill #4

## 2020-05-30 MED FILL — ?CETIRIZINE HCL 10 MG TABLE: 10 | 30 days supply | Qty: 30 | Fill #2

## 2020-06-17 ENCOUNTER — Other Ambulatory Visit: Payer: Self-pay | Admitting: Family Medicine

## 2020-06-17 DIAGNOSIS — R232 Flushing: Secondary | ICD-10-CM

## 2020-06-17 MED FILL — cloNIDine HCL 0.1 MG TABS: 0.1 | 30 days supply | Qty: 30 | Fill #0

## 2020-06-17 MED FILL — ?ATORVASTATIN 20 MG TABLET: 20 | 30 days supply | Qty: 30 | Fill #1

## 2020-06-17 MED FILL — HYDROCHLOROTHIAZIDE 25 MG T: 25 | 30 days supply | Qty: 30 | Fill #3

## 2020-07-04 MED FILL — ?CETIRIZINE HCL 10 MG TABLE: 10 | 30 days supply | Qty: 30 | Fill #3

## 2020-07-04 MED FILL — ?TRAZODONE HCL 150MG TAB: 150 | 30 days supply | Qty: 30 | Fill #3

## 2020-07-04 MED FILL — PANTOPRAZOLE SOD DR 40 MG T: 40 | 30 days supply | Qty: 60 | Fill #5

## 2020-07-13 ENCOUNTER — Ambulatory Visit: Payer: Self-pay | Attending: Family Medicine | Admitting: Family Medicine

## 2020-07-13 ENCOUNTER — Other Ambulatory Visit: Payer: Self-pay | Admitting: Family Medicine

## 2020-07-13 ENCOUNTER — Other Ambulatory Visit: Payer: Self-pay

## 2020-07-13 ENCOUNTER — Encounter: Payer: Self-pay | Admitting: Family Medicine

## 2020-07-13 VITALS — BP 185/119 | HR 50 | Ht <= 58 in | Wt 127.2 lb

## 2020-07-13 DIAGNOSIS — Z1159 Encounter for screening for other viral diseases: Secondary | ICD-10-CM

## 2020-07-13 DIAGNOSIS — R232 Flushing: Secondary | ICD-10-CM

## 2020-07-13 DIAGNOSIS — F419 Anxiety disorder, unspecified: Secondary | ICD-10-CM

## 2020-07-13 DIAGNOSIS — Z23 Encounter for immunization: Secondary | ICD-10-CM

## 2020-07-13 DIAGNOSIS — I10 Essential (primary) hypertension: Secondary | ICD-10-CM

## 2020-07-13 DIAGNOSIS — E876 Hypokalemia: Secondary | ICD-10-CM

## 2020-07-13 DIAGNOSIS — E785 Hyperlipidemia, unspecified: Secondary | ICD-10-CM

## 2020-07-13 DIAGNOSIS — G4709 Other insomnia: Secondary | ICD-10-CM

## 2020-07-13 DIAGNOSIS — Z1231 Encounter for screening mammogram for malignant neoplasm of breast: Secondary | ICD-10-CM

## 2020-07-13 MED ORDER — CARVEDILOL 6.25 MG PO TABS: 6.2500 mg | ORAL_TABLET | Freq: Two times a day (BID) | ORAL | 6 refills | Status: DC

## 2020-07-13 MED ORDER — TRAZODONE HCL 150 MG PO TABS: 150.0000 mg | ORAL_TABLET | Freq: Every evening | ORAL | 6 refills | Status: DC | PRN

## 2020-07-13 MED ORDER — POTASSIUM CHLORIDE CRYS ER 10 MEQ PO TBCR: 10.0000 meq | EXTENDED_RELEASE_TABLET | Freq: Every day | ORAL | 1 refills | Status: DC

## 2020-07-13 MED ORDER — ATORVASTATIN CALCIUM 20 MG PO TABS: 20.0000 mg | ORAL_TABLET | Freq: Every day | ORAL | 1 refills | Status: DC

## 2020-07-13 MED ORDER — CLONIDINE HCL 0.1 MG PO TABS: 0.1000 mg | ORAL_TABLET | Freq: Every day | ORAL | 6 refills | Status: DC

## 2020-07-13 MED ORDER — HYDROCHLOROTHIAZIDE 25 MG PO TABS: 25.0000 mg | ORAL_TABLET | Freq: Every day | ORAL | 6 refills | Status: DC

## 2020-07-13 MED FILL — ATORVASTATIN CALCIUM 20 MG: 20 | 30 days supply | Qty: 30 | Fill #0

## 2020-07-13 MED FILL — cloNIDine HCL 0.1 MG TABS: 0.1 | 30 days supply | Qty: 30 | Fill #0

## 2020-07-13 MED FILL — POTASSIUM CHLORIDE ER 10 ME: 10 | 30 days supply | Qty: 30 | Fill #0

## 2020-07-13 MED FILL — HYDROCHLOROTHIAZIDE 25 MG T: 25 | 30 days supply | Qty: 30 | Fill #0

## 2020-07-13 MED FILL — ?CARVEDILOL 6.25 MG TABLET: 6.25 | 30 days supply | Qty: 60 | Fill #0

## 2020-07-13 NOTE — Progress Notes (Signed)
Having pain in upper abdomen.

## 2020-07-13 NOTE — Progress Notes (Signed)
Name: Alice Woodard   MRN: [id number]    DOB: Aug 30, 1971   Date:07/13/2020       Progress Note  Subjective  Chief Complaint  Patient presents with  . Abdominal Pain    HPI 49 year old female with a history of hypertension, dyslipidemia, achilles tendinitis, and peptic ulcer disease secondary to H. pylori status post cauterization of bleeding vessels. She presents today for follow-up on chronic issues.  Peptic ulcer disease: Being follow-up by gastroenterology. Currently taking pantoprazole 25m twice daily and hyoscysamine 0.1295mas needed. Reports still having intermittent abdominal pain, however it is improved. Denies eating spicy or acidic foods. Denies caffeine intake. Denies nausea, vomiting, hematemesis, or heamtochezia.  Hypertension: Blood pressure today is elevated 185/119. Reports not taking blood pressure medication this morning due to lab work being drawn. She thought she was not suppose to take medications if she is having blood drawn. States she will take medications after having blood drawn. Denies chest pain, lightheadedness or dizziness. Needs refill on her medications.  Dyslipidemia: Reports limiting fat and fried food intake. Taking atorvastatin daily. No issues with medication.  Depression/anxiety: Reports having depressed mood at times due to her medical conditions. Reports only sleeping 3-4 hours even with taking trazodone. Daughter reports the patient feels anxious at times. Patient would like counseling.  Would like influenza vaccine. Also, would like to have immigration paperwork completion for citizenship.  HPI completed with interpreter Bhagat.  PHQ2/9: [facility] 07/13/2020 11/17/2019 03/07/2018 05/30/2017 05/04/2016  Decreased Interest 1 0 0 0 0  Down, Depressed, Hopeless 0 0 0 0 0  PHQ - 2 Score 1 0 0 0 0  Altered sleeping 3 2 0 0 -  Tired, decreased energy 2 0 2 0 -  Change in appetite 0 0 0 0 -  Feeling bad or failure about yourself  0 0 0 0 -   Trouble concentrating 0 0 0 0 -  Moving slowly or fidgety/restless 0 0 0 0 -  Suicidal thoughts 0 0 0 0 -  PHQ-9 Score '6 2 2 ' 0 -  Mild depression  GAD 7 : Generalized Anxiety Score 07/13/2020 11/17/2019 01/14/2019 09/18/2018  Nervous, Anxious, on Edge 0 0 (No Data) (No Data)  Control/stop worrying 0 0 - -  Worry too much - different things 0 0 - -  Trouble relaxing 1 0 - -  Restless 0 0 - -  Easily annoyed or irritable 0 0 - -  Afraid - awful might happen 0 0 - -  Total GAD 7 Score 1 0 - -      Patient Active Problem List   Diagnosis Date Noted  . Ulcer of the duodenum caused by bacteria (H. pylori) 04/19/2017  . Gastrointestinal hemorrhage associated with duodenal ulcer   . Upper GI bleed   . Symptomatic anemia 04/17/2017  . Carpal tunnel syndrome 01/25/2017  . Gastritis and gastroduodenitis 05/04/2016  . Achilles tendinitis of right lower extremity 05/04/2016  . Healthcare maintenance 12/29/2015  . SOB (shortness of breath) 12/29/2015  . Blurry vision, bilateral 11/18/2014  . Dyslipidemia 05/24/2014  . Essential hypertension, benign 05/24/2014    Past Medical History:  Diagnosis Date  . Anemia   . History of blood transfusion 04/17/2017   "anemic"  . Hyperlipidemia   . Hypertension     Past Surgical History:  Procedure Laterality Date  . APPENDECTOMY    . CHOLECYSTECTOMY OPEN    . ESOPHAGOGASTRODUODENOSCOPY N/A 04/18/2017   Procedure: ESOPHAGOGASTRODUODENOSCOPY (EGD);  Surgeon: Mauri Pole, MD;  Location: [facility] ENDOSCOPY;  Service: Endoscopy;  Laterality: N/A;  . UPPER GASTROINTESTINAL ENDOSCOPY  04/2017    Social History   Tobacco Use  . Smoking status: Never Smoker  . Smokeless tobacco: Never Used  Substance Use Topics  . Alcohol use: No     Current Outpatient Medications:  .  atorvastatin (LIPITOR) 20 MG tablet, TAKE 1 TABLET (20 MG TOTAL) BY MOUTH DAILY., Disp: 90 tablet, Rfl: 0 .  carvedilol (COREG) 6.25 MG tablet, Take 1 tablet (6.25 mg total) by  mouth 2 (two) times daily with a meal., Disp: 60 tablet, Rfl: 6 .  cetirizine (ZYRTEC) 10 MG tablet, Take 1 tablet (10 mg total) by mouth daily., Disp: 90 tablet, Rfl: 1 .  cloNIDine (CATAPRES) 0.1 MG tablet, TAKE 1 TABLET (0.1 MG TOTAL) BY MOUTH AT BEDTIME. FOR HOTFLASHES, Disp: 30 tablet, Rfl: 1 .  cyclobenzaprine (FLEXERIL) 10 MG tablet, Take 1 tablet (10 mg total) by mouth at bedtime., Disp: 30 tablet, Rfl: 1 .  hydrochlorothiazide (HYDRODIURIL) 25 MG tablet, Take 1 tablet (25 mg total) by mouth daily., Disp: 30 tablet, Rfl: 6 .  Hyoscyamine Sulfate SL (LEVSIN/SL) 0.125 MG SUBL, Place 0.125 mg under the tongue 2 (two) times daily as needed., Disp: 60 tablet, Rfl: 11 .  pantoprazole (PROTONIX) 40 MG tablet, Take 1 tablet (40 mg total) by mouth 2 (two) times daily., Disp: 60 tablet, Rfl: 11 .  polyethylene glycol powder (GLYCOLAX/MIRALAX) 17 GM/SCOOP powder, Use 1 capful two times daily as needed., Disp: 17 g, Rfl: 0 .  potassium chloride (KLOR-CON) 10 MEQ tablet, Take 1 tablet (10 mEq total) by mouth daily., Disp: 90 tablet, Rfl: 1 .  traZODone (DESYREL) 150 MG tablet, Take 1 tablet (150 mg total) by mouth at bedtime as needed for sleep., Disp: 30 tablet, Rfl: 6 .  benzonatate (TESSALON) 100 MG capsule, Take 1 capsule (100 mg total) by mouth 3 (three) times daily. (Patient not taking: Reported on 11/17/2019), Disp: 30 capsule, Rfl: 0 .  zolpidem (AMBIEN) 5 MG tablet, Take 1 tablet (5 mg total) by mouth at bedtime as needed for sleep. (Patient not taking: Reported on 07/13/2020), Disp: 6 tablet, Rfl: 0  Current Facility-Administered Medications:  .  0.9 %  sodium chloride infusion, 500 mL, Intravenous, Once, Nandigam, Venia Minks, MD  Allergies  Allergen Reactions  . Amlodipine Rash    Review of Systems  Constitutional: Positive for fever. Negative for chills and weight loss.  Respiratory: Negative for cough and shortness of breath.   Cardiovascular: Negative for chest pain and palpitations.   Gastrointestinal: Positive for abdominal pain. Negative for blood in stool, constipation, diarrhea, heartburn, nausea and vomiting.  Musculoskeletal: Negative for myalgias.  Neurological: Negative for dizziness, tingling, speech change, focal weakness, weakness and headaches.  Psychiatric/Behavioral: Positive for depression. Negative for suicidal ideas. The patient is nervous/anxious and has insomnia.     No other specific complaints in a complete review of systems (except as listed in HPI above).  Objective  Vitals:   07/13/20 1020  BP: (!) 185/119  Pulse: (!) 50  SpO2: 100%  Weight: 127 lb 3.2 oz (57.7 kg)  Height: '4\' 10"'  (1.473 m)     Body mass index is 26.58 kg/m.  Nursing Note and Vital Signs reviewed.  Physical Exam Constitutional:      Appearance: She is well-developed.  HENT:     Head: Normocephalic and atraumatic.  Eyes:     General: Lids are normal.  Neck:     Comments: Negative JVD Cardiovascular:     Rate and Rhythm: Regular rhythm. Bradycardia present.     Heart sounds: Normal heart sounds, S1 normal and S2 normal.  Pulmonary:     Effort: Pulmonary effort is normal.     Breath sounds: Normal breath sounds.  Abdominal:     General: Abdomen is flat. A surgical scar is present. Bowel sounds are normal.     Comments: Tenderness with palpation of the epigastric region  Musculoskeletal:     Right lower leg: No edema.     Left lower leg: No edema.  Skin:    General: Skin is warm and dry.  Neurological:     Mental Status: She is alert and oriented to person, place, and time.  Psychiatric:        Attention and Perception: Attention and perception normal.        Mood and Affect: Mood is anxious.        Behavior: Behavior is cooperative.     No results found for this or any previous visit (from the past 48 hour(s)).  Assessment & Plan  1. Essential hypertension, benign Blood pressure elevated during this visit most likely due to pt not taking her  blood pressure medication and anxiety. Educated patient to take medications every morning even when having labs drawn. Last blood pressure taken in the office in June 2021 was normal, 122/74. - CMP14+EGFR - Lipid panel - hydrochlorothiazide (HYDRODIURIL) 25 MG tablet; Take 1 tablet (25 mg total) by mouth daily.  Dispense: 30 tablet; Refill: 6 - carvedilol (COREG) 6.25 MG tablet; Take 1 tablet (6.25 mg total) by mouth 2 (two) times daily with a meal.  Dispense: 60 tablet; Refill: 6  2. Other insomnia Most likely caused by anxiety.  Patient would like counseling to speak with someone about her anxiety/depression, which will most likely help with insomnia. Continue taking medication for sleep as needed. - traZODone (DESYREL) 150 MG tablet; Take 1 tablet (150 mg total) by mouth at bedtime as needed for sleep.  Dispense: 30 tablet; Refill: 6  3. Hypokalemia Will assess lab work.  - potassium chloride (KLOR-CON) 10 MEQ tablet; Take 1 tablet (10 mEq total) by mouth daily.  Dispense: 90 tablet; Refill: 1  4. Hot flashes Controlled - cloNIDine (CATAPRES) 0.1 MG tablet; Take 1 tablet (0.1 mg total) by mouth at bedtime. For hotflashes  Dispense: 30 tablet; Refill: 6  5. Dyslipidemia Blood work on 11/17/2019 revealed elevated cholesterol, triglyceride,and LDL. Pt continue taking atorvastatin daily. Will assess lab work to evaluate for any medication adjustments. - atorvastatin (LIPITOR) 20 MG tablet; Take 1 tablet (20 mg total) by mouth daily.  Dispense: 90 tablet; Refill: 1  6. Need for hepatitis C screening test - HCV RNA quant rflx ultra or genotyp([facility])  7. Encounter for screening mammogram for malignant neoplasm of breast Patient agrees to do mammogram.  - MM 3D SCREEN BREAST BILATERAL; Future  8. Anxiety Patient would like to do counseling to speak to someone about her depressed mood and anxiety. Referral made to social worker.  9. Need for immunization against influenza - Flu  Vaccine QUAD 36+ mos IM  Return in about 2 weeks (around 07/27/2020) for immigration paperwork; Clarita next available for anxiety.   Evaluation and management procedures were performed by me with NP Student in attendance, note written by NP student under my supervision and collaboration. I have reviewed the note and I agree with the management and plan.  Ms Hudock has chronic abdominal pain from chronic peptic ulcer disease which is uncontrolled on her current regimen and she is followed by GI.  She will continue to abstain from NSAIDs and continue with her chronic medications.  Her blood pressure is elevated today due to not taking her antihypertensives but given it has been previously controlled well on her medication I will make no changes today.  She does have some underlying anxiety but is not open to pharmacotherapy at this time and has been scheduled for psychotherapy with LCSW.  She has been scheduled for a subsequent visit to address her immigration paperwork.  Charlott Rakes, MD, FAAFP. [facility], [place]   07/13/2020, 12:31 PM

## 2020-07-13 NOTE — Patient Instructions (Signed)

## 2020-07-14 LAB — CMP14+EGFR
ALT: 14 IU/L (ref 0–32)
AST: 21 IU/L (ref 0–40)
Albumin/Globulin Ratio: 1.4 (ref 1.2–2.2)
Albumin: 4.3 g/dL (ref 3.8–4.9)
Alkaline Phosphatase: 160 IU/L — ABNORMAL HIGH (ref 48–121)
BUN/Creatinine Ratio: 9 (ref 9–23)
BUN: 7 mg/dL (ref 6–24)
Bilirubin Total: 0.7 mg/dL (ref 0.0–1.2)
CO2: 24 mmol/L (ref 20–29)
Calcium: 9.6 mg/dL (ref 8.7–10.2)
Chloride: 106 mmol/L (ref 96–106)
Creatinine, Ser: 0.79 mg/dL (ref 0.57–1.00)
GFR calc Af Amer: 100 mL/min/{1.73_m2} (ref >59)
GFR calc non Af Amer: 87 mL/min/{1.73_m2} (ref >59)
Globulin, Total: 3.1 g/dL (ref 1.5–4.5)
Glucose: 79 mg/dL (ref 65–99)
Potassium: 3.7 mmol/L (ref 3.5–5.2)
Sodium: 146 mmol/L — ABNORMAL HIGH (ref 134–144)
Total Protein: 7.4 g/dL (ref 6.0–8.5)

## 2020-07-14 LAB — LIPID PANEL
Chol/HDL Ratio: 4.4 ratio (ref 0.0–4.4)
Cholesterol, Total: 131 mg/dL (ref 100–199)
HDL: 30 mg/dL — ABNORMAL LOW (ref >39)
LDL Chol Calc (NIH): 55 mg/dL (ref 0–99)
Triglycerides: 292 mg/dL — ABNORMAL HIGH (ref 0–149)
VLDL Cholesterol Cal: 46 mg/dL — ABNORMAL HIGH (ref 5–40)

## 2020-07-14 LAB — HCV RNA QUANT RFLX ULTRA OR GENOTYP: HCV Quant Baseline: NOT DETECTED IU/mL

## 2020-07-20 ENCOUNTER — Ambulatory Visit: Payer: Medicaid Other | Attending: Family Medicine | Admitting: Licensed Clinical Social Worker

## 2020-07-20 ENCOUNTER — Other Ambulatory Visit: Payer: Self-pay

## 2020-07-20 DIAGNOSIS — G4709 Other insomnia: Secondary | ICD-10-CM

## 2020-07-28 ENCOUNTER — Telehealth: Payer: Self-pay

## 2020-07-28 NOTE — Telephone Encounter (Signed)
Patient name and DOB has been verified Patient was informed of lab results. Patient had no questions.  

## 2020-07-28 NOTE — Telephone Encounter (Signed)
-----   Message from Charlott Rakes, MD sent at 07/15/2020  9:26 AM EDT ----- Total cholesterol is normal. Please advise to continue with medications and a low cholesterol diet. One of her liver enzymes is slightly elevated but down compared to previous. This might be due to her GI issues.

## 2020-08-01 ENCOUNTER — Other Ambulatory Visit: Payer: Self-pay

## 2020-08-01 ENCOUNTER — Encounter: Payer: Self-pay | Admitting: Family Medicine

## 2020-08-01 ENCOUNTER — Ambulatory Visit: Payer: Self-pay | Attending: Family Medicine | Admitting: Family Medicine

## 2020-08-01 VITALS — BP 130/85 | HR 88 | Temp 98.4°F | Resp 16 | Wt 127.6 lb

## 2020-08-01 DIAGNOSIS — K2990 Gastroduodenitis, unspecified, without bleeding: Secondary | ICD-10-CM

## 2020-08-01 DIAGNOSIS — Z029 Encounter for administrative examinations, unspecified: Secondary | ICD-10-CM

## 2020-08-01 DIAGNOSIS — K2970 Gastritis, unspecified, without bleeding: Secondary | ICD-10-CM

## 2020-08-01 DIAGNOSIS — I10 Essential (primary) hypertension: Secondary | ICD-10-CM

## 2020-08-01 NOTE — Progress Notes (Signed)
Pt is in the office today to get immigration paperwork filled out

## 2020-08-01 NOTE — Progress Notes (Signed)
Subjective:  Patient ID: Alice Woodard, female    DOB: 01-Oct-1971  Age: 49 y.o. MRN: [id number]  CC: immigration paperwork   HPI Alice Woodard is a 49year old female with a history of hypertension, dyslipidemia, achilles tendinitis, and peptic ulcer disease secondary to H. pylori status post cauterization of bleeding vessels accompanied by her daughter requesting completion of immigration paperwork for exemption from citizenship test. Daughter gives the excuse of patient not being able to comprehend the [place] language and unable to read and write in both [place] and [place]. Seen with the aid of a video interpreter  Past Medical History:  Diagnosis Date  . Anemia   . History of blood transfusion 04/17/2017   "anemic"  . Hyperlipidemia   . Hypertension     Past Surgical History:  Procedure Laterality Date  . APPENDECTOMY    . CHOLECYSTECTOMY OPEN    . ESOPHAGOGASTRODUODENOSCOPY N/A 04/18/2017   Procedure: ESOPHAGOGASTRODUODENOSCOPY (EGD);  Surgeon: Mauri Pole, MD;  Location: [facility] ENDOSCOPY;  Service: Endoscopy;  Laterality: N/A;  . UPPER GASTROINTESTINAL ENDOSCOPY  04/2017    Family History  Problem Relation Age of Onset  . Cancer - Other Mother        blood  . Colon cancer Neg Hx   . Esophageal cancer Neg Hx   . Rectal cancer Neg Hx   . Stomach cancer Neg Hx     Allergies  Allergen Reactions  . Amlodipine Rash    Outpatient Medications Prior to Visit  Medication Sig Dispense Refill  . atorvastatin (LIPITOR) 20 MG tablet Take 1 tablet (20 mg total) by mouth daily. 90 tablet 1  . benzonatate (TESSALON) 100 MG capsule Take 1 capsule (100 mg total) by mouth 3 (three) times daily. (Patient not taking: Reported on 11/17/2019) 30 capsule 0  . carvedilol (COREG) 6.25 MG tablet Take 1 tablet (6.25 mg total) by mouth 2 (two) times daily with a meal. 60 tablet 6  . cetirizine (ZYRTEC) 10 MG tablet Take 1 tablet (10 mg total) by mouth daily. 90 tablet 1  . cloNIDine  (CATAPRES) 0.1 MG tablet Take 1 tablet (0.1 mg total) by mouth at bedtime. For hotflashes 30 tablet 6  . cyclobenzaprine (FLEXERIL) 10 MG tablet Take 1 tablet (10 mg total) by mouth at bedtime. 30 tablet 1  . hydrochlorothiazide (HYDRODIURIL) 25 MG tablet Take 1 tablet (25 mg total) by mouth daily. 30 tablet 6  . Hyoscyamine Sulfate SL (LEVSIN/SL) 0.125 MG SUBL Place 0.125 mg under the tongue 2 (two) times daily as needed. 60 tablet 11  . pantoprazole (PROTONIX) 40 MG tablet Take 1 tablet (40 mg total) by mouth 2 (two) times daily. 60 tablet 11  . polyethylene glycol powder (GLYCOLAX/MIRALAX) 17 GM/SCOOP powder Use 1 capful two times daily as needed. 17 g 0  . potassium chloride (KLOR-CON) 10 MEQ tablet Take 1 tablet (10 mEq total) by mouth daily. 90 tablet 1  . traZODone (DESYREL) 150 MG tablet Take 1 tablet (150 mg total) by mouth at bedtime as needed for sleep. 30 tablet 6  . zolpidem (AMBIEN) 5 MG tablet Take 1 tablet (5 mg total) by mouth at bedtime as needed for sleep. (Patient not taking: Reported on 07/13/2020) 6 tablet 0   Facility-Administered Medications Prior to Visit  Medication Dose Route Frequency Provider Last Rate Last Admin  . 0.9 %  sodium chloride infusion  500 mL Intravenous Once Nandigam, Venia Minks, MD         ROS Review of  Systems  Constitutional: Negative for activity change, appetite change and fatigue.  HENT: Negative for congestion, sinus pressure and sore throat.   Eyes: Negative for visual disturbance.  Respiratory: Negative for cough, chest tightness, shortness of breath and wheezing.   Cardiovascular: Negative for chest pain and palpitations.  Gastrointestinal: Positive for abdominal pain. Negative for abdominal distention and constipation.  Endocrine: Negative for polydipsia.  Genitourinary: Negative for dysuria and frequency.  Musculoskeletal: Negative for arthralgias and back pain.  Skin: Negative for rash.  Neurological: Negative for tremors,  light-headedness and numbness.  Hematological: Does not bruise/bleed easily.  Psychiatric/Behavioral: Negative for agitation and behavioral problems.    Objective:  BP 130/85   Pulse 88   Temp 98.4 F (36.9 C)   Resp 16   Wt 127 lb 9.6 oz (57.9 kg)   LMP 11/15/2014   SpO2 97%   BMI 26.67 kg/m   BP/Weight 08/01/2020 07/13/2020 3/53/6144  Systolic BP 315 400 867  Diastolic BP 85 619 74  Wt. (Lbs) 127.6 127.2 131.38  BMI 26.67 26.58 27.22      Physical Exam Constitutional:      Appearance: She is well-developed.  Neck:     Vascular: No JVD.  Cardiovascular:     Rate and Rhythm: Normal rate.     Heart sounds: Normal heart sounds. No murmur heard.   Pulmonary:     Effort: Pulmonary effort is normal.     Breath sounds: Normal breath sounds. No wheezing or rales.  Chest:     Chest wall: No tenderness.  Abdominal:     General: Bowel sounds are normal. There is no distension.     Palpations: Abdomen is soft. There is no mass.     Tenderness: There is no abdominal tenderness.  Musculoskeletal:        General: Normal range of motion.     Right lower leg: No edema.     Left lower leg: No edema.  Neurological:     Mental Status: She is alert and oriented to person, place, and time.  Psychiatric:        Mood and Affect: Mood normal.     CMP Latest Ref Rng & Units 07/13/2020 11/17/2019 08/17/2019  Glucose 65 - 99 mg/dL 79 87 85  BUN 6 - 24 mg/dL 7 8 9   Creatinine 0.57 - 1.00 mg/dL 0.79 0.77 0.75  Sodium 134 - 144 mmol/L 146(H) 140 141  Potassium 3.5 - 5.2 mmol/L 3.7 3.5 3.5  Chloride 96 - 106 mmol/L 106 99 102  CO2 20 - 29 mmol/L 24 27 25   Calcium 8.7 - 10.2 mg/dL 9.6 9.4 9.5  Total Protein 6.0 - 8.5 g/dL 7.4 - -  Total Bilirubin 0.0 - 1.2 mg/dL 0.7 - -  Alkaline Phos 48 - 121 IU/L 160(H) - -  AST 0 - 40 IU/L 21 - -  ALT 0 - 32 IU/L 14 - -    Lipid Panel     Component Value Date/Time   CHOL 131 07/13/2020 1129   TRIG 292 (H) 07/13/2020 1129   HDL 30 (L) 07/13/2020  1129   CHOLHDL 4.4 07/13/2020 1129   CHOLHDL 4.8 10/26/2016 0933   VLDL 22 10/26/2016 0933   LDLCALC 55 07/13/2020 1129    CBC    Component Value Date/Time   WBC 10.3 07/03/2019 1633   RBC 4.28 07/03/2019 1633   HGB 13.0 07/03/2019 1633   HGB 11.2 05/30/2017 0903   HCT 37.6 07/03/2019 1633   HCT  34.6 05/30/2017 0903   PLT 320 07/03/2019 1633   PLT 329 05/30/2017 0903   MCV 87.9 07/03/2019 1633   MCV 91 05/30/2017 0903   MCH 30.4 07/03/2019 1633   MCHC 34.6 07/03/2019 1633   RDW 13.0 07/03/2019 1633   RDW 14.0 05/30/2017 0903   LYMPHSABS 1.9 05/30/2017 0903   MONOABS 0.6 12/29/2015 1126   EOSABS 0.2 05/30/2017 0903   BASOSABS 0.0 05/30/2017 4373    Lab Results  Component Value Date   HGBA1C 5.0 12/29/2015    Assessment & Plan:  1. Gastritis and gastroduodenitis Currently on PPI Followed by GI  2. Essential hypertension, benign Controlled Continue current regimen  3.  Administrative encounter Immigration paperwork completed    No orders of the defined types were placed in this encounter.   Follow-up: No follow-ups on file.       Charlott Rakes, MD, FAAFP. [facility], [place]   08/01/2020, 9:09 AM

## 2020-08-09 NOTE — BH Specialist Note (Signed)
Integrated Behavioral Health Initial Visit  MRN: [id number] Name: Alice Woodard  Number of [place] Clinician visits:: 1/6 Session Start time: 4:05 PM  Session End time: 4:30 PM Total time: 25  Type of Service: [place] Interpretor:Yes.   Interpretor Name and Language: Daughter provided Nepali translation     SUBJECTIVE: Alice Woodard is a 49 y.o. female accompanied by Adult daughter Patient was referred by Dr. Margarita Rana for depression and anxiety. Patient reports the following symptoms/concerns: Pt reports difficulty obtaining quality sleep. States she sleeps for approximately 3-4 hours which negatively impacts mood and energy  Duration of problem: "long time" more than 2 years; Severity of problem: moderate  OBJECTIVE: Mood: Appropriate and Affect: Appropriate Risk of harm to self or others: No plan to harm self or others  LIFE CONTEXT: Family and Social: Pt receives strong support from family. Resides with daughter in law School/Work: Pt is uninsured Self-Care: Pt is participating in medication management through PCP to assist with sleep. Per chart review, pt is interested in counseling; however, today endorses no depression or anxiety symptoms Life Changes: Pt has difficulty obtaining quality sleep resulting in low energy and mood  GOALS ADDRESSED: Patient will: 1. Demonstrate ability to: improve sleep hygiene by utilizing strategies discussed in session. Handout with tips was discussed and provided  INTERVENTIONS: Interventions utilized: Sleep Hygiene  Standardized Assessments completed: Not Needed  ASSESSMENT: Patient currently experiencing difficulty obtaining quality sleep resulting in low energy and mood. Denies depression or anxiety symptoms. No SI/HI.   Patient may benefit from continued medication management. States medication has not been effective in obtaining sleep. Sleep hygiene tips discussed and pt was  strongly encouraged to follow up with PCP to re-assess. Pt reports compliance with prescribed medications  PLAN: 1. Follow up with behavioral health clinician on : Schedule follow up appointment with LCSW with any behavioral health and/or resource needs 2. Behavioral recommendations: Utilize strategies discussed and continue with medication compliance 3. Referral(s): [place] (In Clinic) 4. "From scale of 1-10, how likely are you to follow plan?":   Rebekah [place]field, LCSW 08/09/2020 6:19 PM

## 2020-08-22 MED FILL — ?CETIRIZINE HCL 10 MG TABLE: 10 | 30 days supply | Qty: 30 | Fill #4

## 2020-08-22 MED FILL — ?cloNIDine HCL 0.1 MG TABS: 0.1 | 30 days supply | Qty: 30 | Fill #1

## 2020-08-22 MED FILL — ATORVASTATIN CALCIUM 20 MG: 20 | 30 days supply | Qty: 30 | Fill #1

## 2020-08-22 MED FILL — PANTOPRAZOLE SOD DR 40 MG T: 40 | 30 days supply | Qty: 60 | Fill #6

## 2020-08-22 MED FILL — ?TRAZODONE HCL 150MG TAB: 150 | 30 days supply | Qty: 30 | Fill #4

## 2020-09-05 ENCOUNTER — Ambulatory Visit: Payer: Medicaid Other | Admitting: Family Medicine

## 2020-09-13 ENCOUNTER — Ambulatory Visit: Payer: Self-pay | Attending: Family Medicine | Admitting: Family Medicine

## 2020-09-13 ENCOUNTER — Other Ambulatory Visit: Payer: Self-pay | Admitting: Family Medicine

## 2020-09-13 ENCOUNTER — Encounter: Payer: Self-pay | Admitting: Family Medicine

## 2020-09-13 ENCOUNTER — Other Ambulatory Visit: Payer: Self-pay

## 2020-09-13 DIAGNOSIS — R232 Flushing: Secondary | ICD-10-CM

## 2020-09-13 DIAGNOSIS — I10 Essential (primary) hypertension: Secondary | ICD-10-CM

## 2020-09-13 DIAGNOSIS — E785 Hyperlipidemia, unspecified: Secondary | ICD-10-CM

## 2020-09-13 DIAGNOSIS — R1084 Generalized abdominal pain: Secondary | ICD-10-CM

## 2020-09-13 DIAGNOSIS — E876 Hypokalemia: Secondary | ICD-10-CM

## 2020-09-13 DIAGNOSIS — R202 Paresthesia of skin: Secondary | ICD-10-CM

## 2020-09-13 DIAGNOSIS — G4709 Other insomnia: Secondary | ICD-10-CM

## 2020-09-13 MED ORDER — CARVEDILOL 6.25 MG PO TABS: 6.2500 mg | ORAL_TABLET | Freq: Two times a day (BID) | ORAL | 6 refills | Status: DC

## 2020-09-13 MED ORDER — GABAPENTIN 300 MG PO CAPS: 300.0000 mg | ORAL_CAPSULE | Freq: Every day | ORAL | 6 refills | Status: DC

## 2020-09-13 MED ORDER — ATORVASTATIN CALCIUM 20 MG PO TABS: 20.0000 mg | ORAL_TABLET | Freq: Every day | ORAL | 1 refills | Status: DC

## 2020-09-13 MED ORDER — TRAZODONE HCL 150 MG PO TABS: 150.0000 mg | ORAL_TABLET | Freq: Every evening | ORAL | 6 refills | Status: DC | PRN

## 2020-09-13 MED ORDER — CLONIDINE HCL 0.1 MG PO TABS: 0.1000 mg | ORAL_TABLET | Freq: Every day | ORAL | 6 refills | Status: DC

## 2020-09-13 MED ORDER — POTASSIUM CHLORIDE CRYS ER 10 MEQ PO TBCR: 10.0000 meq | EXTENDED_RELEASE_TABLET | Freq: Every day | ORAL | 1 refills | Status: DC

## 2020-09-13 MED ORDER — HYDROCHLOROTHIAZIDE 25 MG PO TABS: 25.0000 mg | ORAL_TABLET | Freq: Every day | ORAL | 6 refills | Status: DC

## 2020-09-13 MED FILL — GABAPENTIN 300 MG CAPSULE: 300 | 30 days supply | Qty: 60 | Fill #0

## 2020-09-13 MED FILL — ?TRAZODONE HCL 150 MG TABL: 150 | 30 days supply | Qty: 30 | Fill #0

## 2020-09-13 MED FILL — HYDROCHLOROTHIAZIDE 25 MG T: 25 | 30 days supply | Qty: 30 | Fill #0

## 2020-09-13 MED FILL — POTASSIUM CHLORIDE ER 10 ME: 10 | 30 days supply | Qty: 30 | Fill #0

## 2020-09-13 MED FILL — ?CARVEDILOL 6.25 MG TABLET: 6.25 | 30 days supply | Qty: 60 | Fill #0

## 2020-09-13 NOTE — Progress Notes (Signed)
Wants to discuss recent lab work. Elevated liver enzymes.

## 2020-09-13 NOTE — Progress Notes (Signed)
Virtual Visit via Telephone Note  I connected with Alice Woodard, on 09/13/2020 at 8:36 AM by telephone due to the COVID-19 pandemic and verified that I am speaking with the correct person using two identifiers.   Consent: I discussed the limitations, risks, security and privacy concerns of performing an evaluation and management service by telephone and the availability of in person appointments. I also discussed with the patient that there may be a patient responsible charge related to this service. The patient expressed understanding and agreed to proceed.   Location of Patient: Home  Location of Provider: Clinic   Persons participating in Telemedicine visit: Anja Kashena Novitski Dr. 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 Completed treatment with confirmed eradication. The patient states she is not doing good despite taking a lot of medications. Complains of abdominal pain which is chronic. Seen by GI in 04/2020 at which time she had complained of right upper quadrant pain and as per GI notes-unclear etiology, could be secondary to adhesions from prior cholecystectomy.  Ct abdomen and Pelvis from 11/2017 revealed: IMPRESSION: 1. No acute finding. 2. History of abdominal distention and constipation. Stool volume is moderate. No rectal impaction. 3. Altered perfusion and atrophy in the left lobe liver best attributed to old vascular insult.  R foot burning has been present for 2 weeks, intermittent and does not occur in other body parts. Denies alcohol intake.  She has no known history of diabetes mellitus.  Her chronic conditions including hypertension and hyperlipidemia stable. Past Medical History:  Diagnosis Date  . Anemia   .  History of blood transfusion 04/17/2017   "anemic"  . Hyperlipidemia   . Hypertension    Allergies  Allergen Reactions  . Amlodipine Rash    Current Outpatient Medications on File Prior to Visit  Medication Sig Dispense Refill  . atorvastatin (LIPITOR) 20 MG tablet Take 1 tablet (20 mg total) by mouth daily. 90 tablet 1  . carvedilol (COREG) 6.25 MG tablet Take 1 tablet (6.25 mg total) by mouth 2 (two) times daily with a meal. 60 tablet 6  . cetirizine (ZYRTEC) 10 MG tablet Take 1 tablet (10 mg total) by mouth daily. 90 tablet 1  . cloNIDine (CATAPRES) 0.1 MG tablet Take 1 tablet (0.1 mg total) by mouth at bedtime. For hotflashes 30 tablet 6  . cyclobenzaprine (FLEXERIL) 10 MG tablet Take 1 tablet (10 mg total) by mouth at bedtime. 30 tablet 1  . hydrochlorothiazide (HYDRODIURIL) 25 MG tablet Take 1 tablet (25 mg total) by mouth daily. 30 tablet 6  . Hyoscyamine Sulfate SL (LEVSIN/SL) 0.125 MG SUBL Place 0.125 mg under the tongue 2 (two) times daily as needed. 60 tablet 11  . pantoprazole (PROTONIX) 40 MG tablet Take 1 tablet (40 mg total) by mouth 2 (two) times daily. 60 tablet 11  . polyethylene glycol powder (GLYCOLAX/MIRALAX) 17 GM/SCOOP powder Use 1 capful two times daily as needed. 17 g 0  . potassium chloride (KLOR-CON) 10 MEQ tablet Take 1 tablet (10 mEq total) by mouth daily. 90 tablet 1  . traZODone (DESYREL) 150 MG tablet Take 1 tablet (150 mg total) by mouth at bedtime as needed for sleep. 30 tablet 6  . benzonatate (TESSALON) 100 MG capsule Take 1 capsule (100 mg total) by mouth 3 (three) times daily. (Patient not  taking: Reported on 11/17/2019) 30 capsule 0  . zolpidem (AMBIEN) 5 MG tablet Take 1 tablet (5 mg total) by mouth at bedtime as needed for sleep. (Patient not taking: Reported on 07/13/2020) 6 tablet 0   Current Facility-Administered Medications on File Prior to Visit  Medication Dose Route Frequency Provider Last Rate Last Admin  . 0.9 %  sodium chloride infusion  500  mL Intravenous Once Mauri Pole, MD        Observations/Objective: Awake, alert, and 2x3 Not in acute distress  CMP Latest Ref Rng & Units 07/13/2020 11/17/2019 08/17/2019  Glucose 65 - 99 mg/dL 79 87 85  BUN 6 - 24 mg/dL 7 8 9   Creatinine 0.57 - 1.00 mg/dL 0.79 0.77 0.75  Sodium 134 - 144 mmol/L 146(H) 140 141  Potassium 3.5 - 5.2 mmol/L 3.7 3.5 3.5  Chloride 96 - 106 mmol/L 106 99 102  CO2 20 - 29 mmol/L 24 27 25   Calcium 8.7 - 10.2 mg/dL 9.6 9.4 9.5  Total Protein 6.0 - 8.5 g/dL 7.4 - -  Total Bilirubin 0.0 - 1.2 mg/dL 0.7 - -  Alkaline Phos 48 - 121 IU/L 160(H) - -  AST 0 - 40 IU/L 21 - -  ALT 0 - 32 IU/L 14 - -    Lab Results  Component Value Date   HGBA1C 5.0 12/29/2015    Lipid Panel     Component Value Date/Time   CHOL 131 07/13/2020 1129   TRIG 292 (H) 07/13/2020 1129   HDL 30 (L) 07/13/2020 1129   CHOLHDL 4.4 07/13/2020 1129   CHOLHDL 4.8 10/26/2016 0933   VLDL 22 10/26/2016 0933   LDLCALC 55 07/13/2020 1129   LABVLDL 46 (H) 07/13/2020 1129     Assessment and Plan: 1. Paresthesia of right foot Unknown etiology If symptoms persist consider screening for diabetes and B12 deficiency - gabapentin (NEURONTIN) 300 MG capsule; Take 1 capsule (300 mg total) by mouth at bedtime.  Dispense: 60 capsule; Refill: 6  2. Dyslipidemia Stable Low-cholesterol diet - atorvastatin (LIPITOR) 20 MG tablet; Take 1 tablet (20 mg total) by mouth daily.  Dispense: 90 tablet; Refill: 1  3. Essential hypertension, benign Controlled Counseled on blood pressure goal of less than 130/80, low-sodium, DASH diet, medication compliance, 150 minutes of moderate intensity exercise per week. Discussed medication compliance, adverse effects. - carvedilol (COREG) 6.25 MG tablet; Take 1 tablet (6.25 mg total) by mouth 2 (two) times daily with a meal.  Dispense: 60 tablet; Refill: 6 - hydrochlorothiazide (HYDRODIURIL) 25 MG tablet; Take 1 tablet (25 mg total) by mouth daily.  Dispense:  30 tablet; Refill: 6  4. Hot flashes - cloNIDine (CATAPRES) 0.1 MG tablet; Take 1 tablet (0.1 mg total) by mouth at bedtime. For hotflashes  Dispense: 30 tablet; Refill: 6  5. Hypokalemia Secondary to chronic diuretic use - potassium chloride (KLOR-CON) 10 MEQ tablet; Take 1 tablet (10 mEq total) by mouth daily.  Dispense: 90 tablet; Refill: 1  6. Other insomnia Stable - traZODone (DESYREL) 150 MG tablet; Take 1 tablet (150 mg total) by mouth at bedtime as needed for sleep.  Dispense: 30 tablet; Refill: 6  7. Generalized abdominal pain Unclear etiology We will order abdominal ultrasound to further evaluate - US Abdomen Complete; Future   Follow Up Instructions: 6 months for chronic disease management   I discussed the assessment and treatment plan with the patient. The patient was provided an opportunity to ask questions and all were answered. The patient  agreed with the plan and demonstrated an understanding of the instructions.   The patient was advised to call back or seek an in-person evaluation if the symptoms worsen or if the condition fails to improve as anticipated.     I provided 21 minutes total of non-face-to-face time during this encounter including median intraservice time, reviewing previous notes, investigations, ordering medications, medical decision making, coordinating care and patient verbalized understanding at the end of the visit.     Charlott Rakes, MD, FAAFP. [facility] and [facility], [place]   09/13/2020, 8:36 AM

## 2020-09-16 ENCOUNTER — Ambulatory Visit (HOSPITAL_COMMUNITY): Payer: Medicaid Other

## 2020-09-20 ENCOUNTER — Ambulatory Visit (HOSPITAL_COMMUNITY)
Admission: RE | Admit: 2020-09-20 | Discharge: 2020-09-20 | Disposition: A | Discharge status: Home / Self Care | Payer: Medicaid Other | Source: Ambulatory Visit | Attending: Family Medicine | Admitting: Family Medicine

## 2020-09-20 DIAGNOSIS — R1084 Generalized abdominal pain: Secondary | ICD-10-CM | POA: Insufficient documentation

## 2020-09-20 MED FILL — HYDROCHLOROTHIAZIDE 25 MG T: 25 | 30 days supply | Qty: 30 | Fill #0

## 2020-09-20 MED FILL — ?TRAZODONE HCL 150 MG TABL: 150 | 30 days supply | Qty: 30 | Fill #0

## 2020-09-20 MED FILL — ?CARVEDILOL 6.25 MG TABLET: 6.25 | 30 days supply | Qty: 60 | Fill #0

## 2020-09-20 MED FILL — POTASSIUM CHLORIDE ER 10 ME: 10 | 30 days supply | Qty: 30 | Fill #0

## 2020-09-20 MED FILL — GABAPENTIN 300 MG CAPSULE: 300 | 30 days supply | Qty: 60 | Fill #0

## 2020-09-23 ENCOUNTER — Telehealth: Payer: Self-pay

## 2020-09-23 NOTE — Telephone Encounter (Signed)
Patient name and DOB has been verified Patient was informed of lab results. Patient had no questions.  

## 2020-09-23 NOTE — Telephone Encounter (Signed)
-----   Message from Charlott Rakes, MD sent at 09/21/2020 12:54 PM EST ----- Ultrasound reveals fatty liver and she is already on a Cholesterol medication. Advise to continue with a low cholesterol diet. No explanation of chronic abdominal pain detected.

## 2020-09-26 MED FILL — ?cloNIDine HCL 0.1 MG TABS: 0.1 | 30 days supply | Qty: 30 | Fill #2

## 2020-09-26 MED FILL — PANTOPRAZOLE SOD DR 40 MG T: 40 | 30 days supply | Qty: 60 | Fill #0

## 2020-09-26 MED FILL — ?ATORVASTATIN 20 MG TABLET: 20 | 30 days supply | Qty: 30 | Fill #2

## 2020-10-26 MED FILL — PANTOPRAZOLE SOD DR 40 MG T: 40 | 30 days supply | Qty: 60 | Fill #1

## 2020-10-26 MED FILL — ?CETIRIZINE HCL 10 MG TABLE: 10 | 30 days supply | Qty: 30 | Fill #5

## 2020-10-26 MED FILL — HYDROCHLOROTHIAZIDE 25 MG T: 25 | 30 days supply | Qty: 30 | Fill #1

## 2020-10-26 MED FILL — ?CARVEDILOL 6.25 MG TABLET: 6.25 | 30 days supply | Qty: 60 | Fill #1

## 2020-10-26 MED FILL — POTASSIUM CHLORIDE ER 10 ME: 10 | 30 days supply | Qty: 30 | Fill #1

## 2020-10-26 MED FILL — ?ATORVASTATIN 20 MG TABLET: 20 | 30 days supply | Qty: 30 | Fill #3

## 2020-10-26 MED FILL — ?TRAZODONE HCL 150 MG TABL: 150 | 30 days supply | Qty: 30 | Fill #1

## 2020-10-26 MED FILL — ?cloNIDine HCL 0.1 MG TABS: 0.1 | 30 days supply | Qty: 30 | Fill #3

## 2020-12-01 ENCOUNTER — Other Ambulatory Visit: Payer: Self-pay | Admitting: Family Medicine

## 2020-12-01 DIAGNOSIS — R0982 Postnasal drip: Secondary | ICD-10-CM

## 2020-12-01 MED FILL — ?cloNIDine HCL 0.1 MG TABS: 0.1 | 30 days supply | Qty: 30 | Fill #4

## 2020-12-01 MED FILL — PANTOPRAZOLE SOD DR 40 MG T: 40 | 30 days supply | Qty: 60 | Fill #2

## 2020-12-01 MED FILL — POTASSIUM CHLORIDE ER 10 ME: 10 | 30 days supply | Qty: 30 | Fill #2

## 2020-12-01 MED FILL — ?ATORVASTATIN 20 MG TABLET: 20 | 30 days supply | Qty: 30 | Fill #4

## 2020-12-01 MED FILL — HYDROCHLOROTHIAZIDE 25 MG T: 25 | 30 days supply | Qty: 30 | Fill #2

## 2020-12-02 ENCOUNTER — Other Ambulatory Visit: Payer: Self-pay

## 2020-12-02 ENCOUNTER — Ambulatory Visit: Payer: Self-pay | Attending: Family Medicine

## 2020-12-02 MED FILL — CETIRIZINE HCL 10 MG TABLET: 10 | 30 days supply | Qty: 30 | Fill #0

## 2021-01-04 MED FILL — cloNIDine HCL 0.1 MG TABS: 0.1 | 30 days supply | Qty: 30 | Fill #5

## 2021-01-04 MED FILL — ?ATORVASTATIN 20 MG TABLET: 20 | 30 days supply | Qty: 30 | Fill #5

## 2021-01-04 MED FILL — PANTOPRAZOLE SOD DR 40 MG T: 40 | 30 days supply | Qty: 60 | Fill #3

## 2021-01-04 MED FILL — ?CARVEDILOL 6.25 MG TABLET: 6.25 | 30 days supply | Qty: 60 | Fill #2

## 2021-01-04 MED FILL — ?CETIRIZINE HCL 10 MG TABLE: 10 | 30 days supply | Qty: 30 | Fill #0

## 2021-01-04 MED FILL — TRAZODONE HCL 150 MG TABS: 150 | 30 days supply | Qty: 30 | Fill #2

## 2021-01-04 MED FILL — HYDROCHLOROTHIAZIDE 25 MG T: 25 | 30 days supply | Qty: 30 | Fill #3

## 2021-02-09 MED FILL — POTASSIUM CHLORIDE ER 10 ME: 10 | 30 days supply | Qty: 30 | Fill #3

## 2021-02-09 MED FILL — cloNIDine HCL 0.1 MG TABS: 0.1 | 30 days supply | Qty: 30 | Fill #6

## 2021-02-09 MED FILL — ?CARVEDILOL 6.25 MG TABLET: 6.25 | 30 days supply | Qty: 60 | Fill #3

## 2021-02-09 MED FILL — ?ATORVASTATIN 20 MG TABLET: 20 | 30 days supply | Qty: 30 | Fill #2

## 2021-02-09 MED FILL — PANTOPRAZOLE SOD DR 40 MG T: 40 | 30 days supply | Qty: 60 | Fill #4

## 2021-02-09 MED FILL — HYDROCHLOROTHIAZIDE 25 MG T: 25 | 30 days supply | Qty: 30 | Fill #4

## 2021-02-11 ENCOUNTER — Other Ambulatory Visit: Payer: Self-pay

## 2021-03-09 ENCOUNTER — Other Ambulatory Visit: Payer: Self-pay

## 2021-03-09 MED FILL — Trazodone HCl Tab 150 MG: ORAL | 30 days supply | Qty: 30 | Fill #0 | Status: AC

## 2021-03-09 MED FILL — Cetirizine HCl Tab 10 MG: ORAL | 30 days supply | Qty: 30 | Fill #0 | Status: AC

## 2021-03-10 ENCOUNTER — Other Ambulatory Visit: Payer: Self-pay

## 2021-03-30 ENCOUNTER — Other Ambulatory Visit: Payer: Self-pay

## 2021-03-30 ENCOUNTER — Encounter: Payer: Self-pay | Admitting: Family Medicine

## 2021-03-30 ENCOUNTER — Ambulatory Visit: Payer: Self-pay | Attending: Family Medicine | Admitting: Family Medicine

## 2021-03-30 VITALS — BP 151/83 | HR 53 | Ht <= 58 in | Wt 121.6 lb

## 2021-03-30 DIAGNOSIS — Z0000 Encounter for general adult medical examination without abnormal findings: Secondary | ICD-10-CM

## 2021-03-30 DIAGNOSIS — I10 Essential (primary) hypertension: Secondary | ICD-10-CM

## 2021-03-30 DIAGNOSIS — Z124 Encounter for screening for malignant neoplasm of cervix: Secondary | ICD-10-CM

## 2021-03-30 DIAGNOSIS — G4709 Other insomnia: Secondary | ICD-10-CM

## 2021-03-30 DIAGNOSIS — M25562 Pain in left knee: Secondary | ICD-10-CM

## 2021-03-30 DIAGNOSIS — Z1231 Encounter for screening mammogram for malignant neoplasm of breast: Secondary | ICD-10-CM

## 2021-03-30 MED ORDER — DICLOFENAC SODIUM 1 % EX GEL
4.0000 g | Freq: Four times a day (QID) | CUTANEOUS | 1 refills | Status: DC
  Filled 2021-03-30: qty 100, 7d supply, fill #0

## 2021-03-30 MED ORDER — LOSARTAN POTASSIUM 100 MG PO TABS
100.0000 mg | ORAL_TABLET | Freq: Every day | ORAL | 1 refills | Status: DC
  Filled 2021-03-30: qty 30, 30d supply, fill #0
  Filled 2021-05-01: qty 30, 30d supply, fill #1

## 2021-03-30 MED ORDER — TRAZODONE HCL 50 MG PO TABS
50.0000 mg | ORAL_TABLET | Freq: Every day | ORAL | 6 refills | Status: DC
  Filled 2021-03-30: qty 30, 30d supply, fill #0

## 2021-03-30 NOTE — Progress Notes (Signed)
Subjective:  Patient ID: Alice Woodard, female    DOB: 1971/04/11  Age: 50 y.o. 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  Interval History: She is due for a mammogram, Pap smear.  I had ordered a mammogram in 07/2020 however she never followed through with this.  Last colonoscopy was in 05/2019 with repeat recommended in 05/2026.  2 polyps were resected with pathology revealing hyperplastic polyp and tubular adenoma, no malignancy. Her blood pressure is elevated and she endorses compliance with her antihypertensive. She would like the dose of her trazodone decreased as this is causing excessive sedation for her.  She also complains of posterior left knee pain which has been present for a couple of weeks with an associated swelling.  Denies history of trauma Past Medical History:  Diagnosis Date  . Anemia   . History of blood transfusion 04/17/2017   "anemic"  . Hyperlipidemia   . Hypertension     Past Surgical History:  Procedure Laterality Date  . APPENDECTOMY    . CHOLECYSTECTOMY OPEN    . ESOPHAGOGASTRODUODENOSCOPY N/A 04/18/2017   Procedure: ESOPHAGOGASTRODUODENOSCOPY (EGD);  Surgeon: Mauri Pole, MD;  Location[facility] ENDOSCOPY;  Service: Endoscopy;  Laterality: N/A;  . UPPER GASTROINTESTINAL ENDOSCOPY  04/2017    Family History  Problem Relation Age of Onset  . Cancer - Other Mother        blood  . Colon cancer Neg Hx   . Esophageal cancer Neg Hx   . Rectal cancer Neg Hx   . Stomach cancer Neg Hx     Allergies  Allergen Reactions  . Amlodipine Rash    Outpatient Medications Prior to Visit  Medication Sig Dispense Refill  . atorvastatin (LIPITOR) 20 MG tablet Take 1 tablet (20 mg  total) by mouth daily. 90 tablet 1  . carvedilol (COREG) 6.25 MG tablet TAKE 1 TABLET (6.25 MG TOTAL) BY MOUTH 2 (TWO) TIMES DAILY WITH A MEAL. 60 tablet 6  . cetirizine (ZYRTEC) 10 MG tablet TAKE 1 TABLET (10 MG TOTAL) BY MOUTH DAILY. 30 tablet 1  . cloNIDine (CATAPRES) 0.1 MG tablet Take 1 tablet (0.1 mg total) by mouth at bedtime. For hotflashes 30 tablet 6  . cyclobenzaprine (FLEXERIL) 10 MG tablet Take 1 tablet (10 mg total) by mouth at bedtime. 30 tablet 1  . gabapentin (NEURONTIN) 300 MG capsule TAKE 1 CAPSULE (300 MG TOTAL) BY MOUTH AT BEDTIME. 60 capsule 6  . Hyoscyamine Sulfate SL (LEVSIN/SL) 0.125 MG SUBL Place 0.125 mg under the tongue 2 (two) times daily as needed. 60 tablet 11  . pantoprazole (PROTONIX) 40 MG tablet TAKE 1 TABLET (40 MG TOTAL) BY MOUTH 2 (TWO) TIMES DAILY. 60 tablet 11  . polyethylene glycol powder (GLYCOLAX/MIRALAX) 17 GM/SCOOP powder Use 1 capful two times daily as needed. 17 g 0  . potassium chloride (KLOR-CON) 10 MEQ tablet Take 1 tablet (10 mEq total) by mouth daily. 90 tablet 1  . potassium chloride (KLOR-CON) 10 MEQ tablet TAKE 1 TABLET (10 MEQ TOTAL) BY MOUTH DAILY. 90 tablet 1  . hydrochlorothiazide (HYDRODIURIL) 25 MG tablet TAKE 1 TABLET (25 MG TOTAL) BY MOUTH DAILY. 30 tablet 6  . traZODone (DESYREL) 150 MG tablet TAKE 1 TABLET (150 MG TOTAL) BY MOUTH AT BEDTIME AS  NEEDED FOR SLEEP. 30 tablet 6  . benzonatate (TESSALON) 100 MG capsule Take 1 capsule (100 mg total) by mouth 3 (three) times daily. (Patient not taking: No sig reported) 30 capsule 0  . zolpidem (AMBIEN) 5 MG tablet Take 1 tablet (5 mg total) by mouth at bedtime as needed for sleep. (Patient not taking: No sig reported) 6 tablet 0   Facility-Administered Medications Prior to Visit  Medication Dose Route Frequency Provider Last Rate Last Admin  . 0.9 %  sodium chloride infusion  500 mL Intravenous Once Nandigam, Kavitha V, MD         ROS Review of Systems  Constitutional: Negative for  activity change, appetite change and fatigue.  HENT: Negative for congestion, sinus pressure and sore throat.   Eyes: Negative for visual disturbance.  Respiratory: Negative for cough, chest tightness, shortness of breath and wheezing.   Cardiovascular: Negative for chest pain and palpitations.  Gastrointestinal: Negative for abdominal distention, abdominal pain and constipation.  Endocrine: Negative for polydipsia.  Genitourinary: Negative for dysuria and frequency.  Musculoskeletal:       See HPI  Skin: Negative for rash.  Neurological: Negative for tremors, light-headedness and numbness.  Hematological: Does not bruise/bleed easily.  Psychiatric/Behavioral: Negative for agitation and behavioral problems.    Objective:  BP (!) 151/83   Pulse (!) 53   Ht 4\' 10"  (1.473 m)   Wt 121 lb 9.6 oz (55.2 kg)   LMP 11/15/2014   SpO2 100%   BMI 25.41 kg/m   BP/Weight 03/30/2021 [id number]  Systolic BP [id number]  Diastolic BP 83 85 123456  Wt. (Lbs) 121.6 127.6 127.2  BMI 25.41 26.67 26.58      Physical Exam Constitutional:      General: She is not in acute distress.    Appearance: She is well-developed. She is not diaphoretic.  HENT:     Head: Normocephalic.     Right Ear: External ear normal.     Left Ear: External ear normal.     Nose: Nose normal.  Eyes:     Conjunctiva/sclera: Conjunctivae normal.     Pupils: Pupils are equal, round, and reactive to light.  Neck:     Vascular: No JVD.  Cardiovascular:     Rate and Rhythm: Normal rate and regular rhythm.     Heart sounds: Normal heart sounds. No murmur heard. No gallop.   Pulmonary:     Effort: Pulmonary effort is normal. No respiratory distress.     Breath sounds: Normal breath sounds. No wheezing or rales.  Chest:     Chest wall: No tenderness.  Breasts:     Right: No mass, tenderness, axillary adenopathy or supraclavicular adenopathy.     Left: No mass, tenderness, axillary adenopathy or supraclavicular  adenopathy.    Abdominal:     General: Bowel sounds are normal. There is no distension.     Palpations: Abdomen is soft. There is no mass.     Tenderness: There is no abdominal tenderness.  Genitourinary:    Comments: External genitalia, vagina, cervix, adnexa-normal Musculoskeletal:        General: No tenderness. Normal range of motion.     Cervical back: Normal range of motion.     Comments: Tenderness to palpation of left popliteal fossa  Lymphadenopathy:     Upper Body:     Right upper body: No supraclavicular or axillary adenopathy.     Left upper body: No supraclavicular or axillary adenopathy.  Skin:  General: Skin is warm and dry.     Comments: Large bruise on left medial thigh  Neurological:     Mental Status: She is alert and oriented to person, place, and time.     Deep Tendon Reflexes: Reflexes are normal and symmetric.     CMP Latest Ref Rng & Units 07/13/2020 11/17/2019 08/17/2019  Glucose 65 - 99 mg/dL 79 87 85  BUN 6 - 24 mg/dL 7 8 9   Creatinine 0.57 - 1.00 mg/dL 0.79 0.77 0.75  Sodium 134 - 144 mmol/L 146(H) 140 141  Potassium 3.5 - 5.2 mmol/L 3.7 3.5 3.5  Chloride 96 - 106 mmol/L 106 99 102  CO2 20 - 29 mmol/L 24 27 25   Calcium 8.7 - 10.2 mg/dL 9.6 9.4 9.5  Total Protein 6.0 - 8.5 g/dL 7.4 - -  Total Bilirubin 0.0 - 1.2 mg/dL 0.7 - -  Alkaline Phos 48 - 121 IU/L 160(H) - -  AST 0 - 40 IU/L 21 - -  ALT 0 - 32 IU/L 14 - -    Lipid Panel     Component Value Date/Time   CHOL 131 07/13/2020 1129   TRIG 292 (H) 07/13/2020 1129   HDL 30 (L) 07/13/2020 1129   CHOLHDL 4.4 07/13/2020 1129   CHOLHDL 4.8 10/26/2016 0933   VLDL 22 10/26/2016 0933   LDLCALC 55 07/13/2020 1129    CBC    Component Value Date/Time   WBC 10.3 07/03/2019 1633   RBC 4.28 07/03/2019 1633   HGB 13.0 07/03/2019 1633   HGB 11.2 05/30/2017 0903   HCT 37.6 07/03/2019 1633   HCT 34.6 05/30/2017 0903   PLT 320 07/03/2019 1633   PLT 329 05/30/2017 0903   MCV 87.9 07/03/2019 1633    MCV 91 05/30/2017 0903   MCH 30.4 07/03/2019 1633   MCHC 34.6 07/03/2019 1633   RDW 13.0 07/03/2019 1633   RDW 14.0 05/30/2017 0903   LYMPHSABS 1.9 05/30/2017 0903   MONOABS 0.6 12/29/2015 1126   EOSABS 0.2 05/30/2017 0903   BASOSABS 0.0 05/30/2017 0903    Lab Results  Component Value Date   HGBA1C 5.0 12/29/2015    Assessment & Plan:  1. Annual physical exam Counseled on 150 minutes of exercise per week, healthy eating (including decreased daily intake of saturated fats, cholesterol, added sugars, sodium), STI prevention, routine healthcare maintenance.  2. Other insomnia Decreased dose of trazodone per patient request - traZODone (DESYREL) 50 MG tablet; Take 1 tablet (50 mg total) by mouth at bedtime.  Dispense: 30 tablet; Refill: 6  3. Screening for cervical cancer - Cytology - PAP([facility])  4. Encounter for screening mammogram for malignant neoplasm of breast - MM 3D SCREEN BREAST BILATERAL; Future  5. Essential hypertension, benign Uncontrolled Discontinue HCTZ and substituted with losartan Will reassess blood pressure at next visit and check potassium level.  If normal will consider discontinuing potassium tablets    Meds ordered this encounter  Medications  . losartan (COZAAR) 100 MG tablet    Sig: Take 1 tablet (100 mg total) by mouth daily.    Dispense:  90 tablet    Refill:  1    Discontinue HCTZ  . traZODone (DESYREL) 50 MG tablet    Sig: Take 1 tablet (50 mg total) by mouth at bedtime.    Dispense:  30 tablet    Refill:  6    Dose decrease    Follow-up: Return in about 1 month (around 04/30/2021) for medical conditions.  Charlott Rakes, MD, FAAFP. [facility] and [facility], [place]   03/30/2021, 10:45 AM

## 2021-03-30 NOTE — Progress Notes (Signed)
Physical/pap

## 2021-04-03 LAB — CYTOLOGY - PAP
Comment: NEGATIVE
Diagnosis: NEGATIVE
High risk HPV: NEGATIVE

## 2021-04-19 ENCOUNTER — Other Ambulatory Visit: Payer: Self-pay

## 2021-04-19 MED FILL — Carvedilol Tab 6.25 MG: ORAL | 30 days supply | Qty: 60 | Fill #0 | Status: AC

## 2021-04-20 ENCOUNTER — Other Ambulatory Visit: Payer: Self-pay

## 2021-05-01 ENCOUNTER — Other Ambulatory Visit: Payer: Self-pay

## 2021-05-02 ENCOUNTER — Other Ambulatory Visit: Payer: Self-pay

## 2021-05-08 ENCOUNTER — Other Ambulatory Visit: Payer: Self-pay

## 2021-05-08 ENCOUNTER — Encounter: Payer: Self-pay | Admitting: Family Medicine

## 2021-05-08 ENCOUNTER — Ambulatory Visit: Payer: Self-pay | Attending: Family Medicine | Admitting: Family Medicine

## 2021-05-08 DIAGNOSIS — G4709 Other insomnia: Secondary | ICD-10-CM

## 2021-05-08 DIAGNOSIS — J328 Other chronic sinusitis: Secondary | ICD-10-CM

## 2021-05-08 DIAGNOSIS — E785 Hyperlipidemia, unspecified: Secondary | ICD-10-CM

## 2021-05-08 DIAGNOSIS — I10 Essential (primary) hypertension: Secondary | ICD-10-CM

## 2021-05-08 MED ORDER — ATORVASTATIN CALCIUM 20 MG PO TABS
20.0000 mg | ORAL_TABLET | Freq: Every day | ORAL | 1 refills | Status: DC
  Filled 2021-05-08: qty 30, 30d supply, fill #0
  Filled 2021-06-05: qty 30, 30d supply, fill #1

## 2021-05-08 MED ORDER — LOSARTAN POTASSIUM 100 MG PO TABS
100.0000 mg | ORAL_TABLET | Freq: Every day | ORAL | 1 refills | Status: DC
  Filled 2021-05-08 – 2021-06-05 (×2): qty 30, 30d supply, fill #0

## 2021-05-08 MED ORDER — SPIRONOLACTONE 25 MG PO TABS
25.0000 mg | ORAL_TABLET | Freq: Every day | ORAL | 1 refills | Status: DC
  Filled 2021-05-08: qty 30, 30d supply, fill #0
  Filled 2021-06-05: qty 30, 30d supply, fill #1

## 2021-05-08 MED ORDER — TRAZODONE HCL 50 MG PO TABS
50.0000 mg | ORAL_TABLET | Freq: Every day | ORAL | 1 refills | Status: DC
  Filled 2021-05-08: qty 30, 30d supply, fill #0
  Filled 2021-06-20: qty 30, 30d supply, fill #1

## 2021-05-08 MED ORDER — CETIRIZINE HCL 10 MG PO TABS
ORAL_TABLET | Freq: Every day | ORAL | 1 refills | Status: DC
  Filled 2021-05-08: qty 30, 30d supply, fill #0
  Filled 2021-06-05: qty 30, 30d supply, fill #1

## 2021-05-08 MED ORDER — CARVEDILOL 6.25 MG PO TABS
ORAL_TABLET | Freq: Two times a day (BID) | ORAL | 6 refills | Status: DC
  Filled 2021-05-08 – 2021-06-20 (×2): qty 60, 30d supply, fill #0
  Filled 2021-08-11: qty 60, 30d supply, fill #1
  Filled 2021-09-28: qty 60, 30d supply, fill #2
  Filled 2021-10-31: qty 60, 30d supply, fill #3

## 2021-05-08 NOTE — Progress Notes (Signed)
Subjective:  Patient ID: Alice Woodard, female    DOB: [birth year]  Age: 50 y.o. MRN: [id number]  CC: Hypertension   HPI Alice Woodard  is a 50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 Seen with the aid of a Nepali video interpreter.  Interval History: She complains of 'swelling in her head, around her eyes frontal pain'.  Does not have fever, myalgias, rhinorrhea but does have postnasal drip.  She is on Zyrtec. I have reviewed the medication she has with her today and atorvastatin is missing and so is gabapentin.  She informs me pain in her feet is a lot better but she will still need medications to use as needed.  She does not have abdominal pain. Her blood pressure is elevated despite compliance with her antihypertensive.  Past Medical History:  Diagnosis Date   Anemia    History of blood transfusion 04/17/2017   "anemic"   Hyperlipidemia    Hypertension     Past Surgical History:  Procedure Laterality Date   APPENDECTOMY     CHOLECYSTECTOMY OPEN     ESOPHAGOGASTRODUODENOSCOPY N/A 04/18/2017   Procedure: ESOPHAGOGASTRODUODENOSCOPY (EGD);  Surgeon: Mauri Pole, MD;  Location: [facility] ENDOSCOPY;  Service: Endoscopy;  Laterality: N/A;   UPPER GASTROINTESTINAL ENDOSCOPY  04/2017    Family History  Problem Relation Age of Onset   Cancer - Other Mother        blood   Colon cancer Neg Hx    Esophageal cancer Neg Hx    Rectal cancer Neg Hx    Stomach cancer Neg Hx     Allergies  Allergen Reactions   Amlodipine Rash    Outpatient Medications Prior to Visit  Medication Sig Dispense Refill   cloNIDine (CATAPRES) 0.1 MG tablet Take 1 tablet (0.1 mg total) by mouth at bedtime. For hotflashes 30 tablet 6   diclofenac Sodium (VOLTAREN) 1 % GEL Apply 4 g topically 4 (four) times daily. 100 g 1   gabapentin  (NEURONTIN) 300 MG capsule TAKE 1 CAPSULE (300 MG TOTAL) BY MOUTH AT BEDTIME. 60 capsule 6   Hyoscyamine Sulfate SL (LEVSIN/SL) 0.125 MG SUBL Place 0.125 mg under the tongue 2 (two) times daily as needed. 60 tablet 11   polyethylene glycol powder (GLYCOLAX/MIRALAX) 17 GM/SCOOP powder Use 1 capful two times daily as needed. 17 g 0   atorvastatin (LIPITOR) 20 MG tablet Take 1 tablet (20 mg total) by mouth daily. 90 tablet 1   carvedilol (COREG) 6.25 MG tablet TAKE 1 TABLET (6.25 MG TOTAL) BY MOUTH 2 (TWO) TIMES DAILY WITH A MEAL. 60 tablet 6   cetirizine (ZYRTEC) 10 MG tablet TAKE 1 TABLET (10 MG TOTAL) BY MOUTH DAILY. 30 tablet 1   cyclobenzaprine (FLEXERIL) 10 MG tablet Take 1 tablet (10 mg total) by mouth at bedtime. 30 tablet 1   losartan (COZAAR) 100 MG tablet Take 1 tablet (100 mg total) by mouth daily. 90 tablet 1   potassium chloride (KLOR-CON) 10 MEQ tablet TAKE 1 TABLET (10 MEQ TOTAL) BY MOUTH DAILY. 90 tablet 1   traZODone (DESYREL) 50 MG tablet Take 1 tablet (50 mg total) by mouth at bedtime. 30 tablet 6   benzonatate (TESSALON) 100 MG capsule Take 1 capsule (100 mg total) by mouth 3 (three) times daily. (Patient not taking: No sig reported) 30 capsule 0   pantoprazole (PROTONIX) 40 MG tablet  TAKE 1 TABLET (40 MG TOTAL) BY MOUTH 2 (TWO) TIMES DAILY. 60 tablet 11   zolpidem (AMBIEN) 5 MG tablet Take 1 tablet (5 mg total) by mouth at bedtime as needed for sleep. (Patient not taking: No sig reported) 6 tablet 0   potassium chloride (KLOR-CON) 10 MEQ tablet Take 1 tablet (10 mEq total) by mouth daily. 90 tablet 1   Facility-Administered Medications Prior to Visit  Medication Dose Route Frequency Provider Last Rate Last Admin   0.9 %  sodium chloride infusion  500 mL Intravenous Once Nandigam, Kavitha V, MD         ROS Review of Systems  Constitutional:  Negative for activity change, appetite change and fatigue.  HENT:  Positive for postnasal drip and sinus pressure. Negative for  congestion and sore throat.   Eyes:  Negative for visual disturbance.  Respiratory:  Negative for cough, chest tightness, shortness of breath and wheezing.   Cardiovascular:  Negative for chest pain and palpitations.  Gastrointestinal:  Negative for abdominal distention, abdominal pain and constipation.  Endocrine: Negative for polydipsia.  Genitourinary:  Negative for dysuria and frequency.  Musculoskeletal:        Pain in feet.  Skin:  Negative for rash.  Neurological:  Negative for tremors, light-headedness and numbness.  Hematological:  Does not bruise/bleed easily.  Psychiatric/Behavioral:  Negative for agitation and behavioral problems.    Objective:  BP (!) 148/84   Pulse (!) 54   Ht '4\' 10"'  (1.473 m)   Wt 122 lb 9.6 oz (55.6 kg)   LMP 11/15/2014   SpO2 99%   BMI 25.62 kg/m   BP/Weight 05/08/2021 03/30/2021 8/50/2774  Systolic BP 128 786 767  Diastolic BP 84 83 85  Wt. (Lbs) 122.6 121.6 127.6  BMI 25.62 25.41 26.67      Physical Exam Constitutional:      Appearance: She is well-developed.  HENT:     Head:     Comments: No tenderness to palpation of frontal, maxillary and ethmoidal sinuses    Mouth/Throat:     Mouth: Mucous membranes are moist.  Neck:     Vascular: No JVD.  Cardiovascular:     Rate and Rhythm: Bradycardia present.     Heart sounds: Normal heart sounds. No murmur heard. Pulmonary:     Effort: Pulmonary effort is normal.     Breath sounds: Normal breath sounds. No wheezing or rales.  Chest:     Chest wall: No tenderness.  Abdominal:     General: Bowel sounds are normal. There is no distension.     Palpations: Abdomen is soft. There is no mass.     Tenderness: There is no abdominal tenderness.  Musculoskeletal:        General: Normal range of motion.     Right lower leg: No edema.     Left lower leg: No edema.  Neurological:     Mental Status: She is alert and oriented to person, place, and time.  Psychiatric:        Mood and Affect: Mood  normal.    CMP Latest Ref Rng & Units 07/13/2020 11/17/2019 08/17/2019  Glucose 65 - 99 mg/dL 79 87 85  BUN 6 - 24 mg/dL '7 8 9  ' Creatinine 0.57 - 1.00 mg/dL 0.79 0.77 0.75  Sodium 134 - 144 mmol/L 146(H) 140 141  Potassium 3.5 - 5.2 mmol/L 3.7 3.5 3.5  Chloride 96 - 106 mmol/L 106 99 102  CO2 20 - 29 mmol/L 24  27 25  Calcium 8.7 - 10.2 mg/dL 9.6 9.4 9.5  Total Protein 6.0 - 8.5 g/dL 7.4 - -  Total Bilirubin 0.0 - 1.2 mg/dL 0.7 - -  Alkaline Phos 48 - 121 IU/L 160(H) - -  AST 0 - 40 IU/L 21 - -  ALT 0 - 32 IU/L 14 - -    Lipid Panel     Component Value Date/Time   CHOL 131 07/13/2020 1129   TRIG 292 (H) 07/13/2020 1129   HDL 30 (L) 07/13/2020 1129   CHOLHDL 4.4 07/13/2020 1129   CHOLHDL 4.8 10/26/2016 0933   VLDL 22 10/26/2016 0933   LDLCALC 55 07/13/2020 1129    CBC    Component Value Date/Time   WBC 10.3 07/03/2019 1633   RBC 4.28 07/03/2019 1633   HGB 13.0 07/03/2019 1633   HGB 11.2 05/30/2017 0903   HCT 37.6 07/03/2019 1633   HCT 34.6 05/30/2017 0903   PLT 320 07/03/2019 1633   PLT 329 05/30/2017 0903   MCV 87.9 07/03/2019 1633   MCV 91 05/30/2017 0903   MCH 30.4 07/03/2019 1633   MCHC 34.6 07/03/2019 1633   RDW 13.0 07/03/2019 1633   RDW 14.0 05/30/2017 0903   LYMPHSABS 1.9 05/30/2017 0903   MONOABS 0.6 12/29/2015 1126   EOSABS 0.2 05/30/2017 0903   BASOSABS 0.0 05/30/2017 0903    Lab Results  Component Value Date   HGBA1C 5.0 12/29/2015    Assessment & Plan:  1. Dyslipidemia Total cholesterol is controlled but she does have hypertriglyceridemia Looks like she has not been compliant with atorvastatin Will send of lipid panel but make no regimen adjustment if above goal due to the fact that she has been off Lipitor Low-cholesterol diet - atorvastatin (LIPITOR) 20 MG tablet; Take 1 tablet (20 mg total) by mouth daily.  Dispense: 90 tablet; Refill: 1 - Lipid panel  2. Essential hypertension, benign Uncontrolled Spironolactone added to  regimen Discontinue potassium - spironolactone (ALDACTONE) 25 MG tablet; Take 1 tablet (25 mg total) by mouth daily.  Dispense: 90 tablet; Refill: 1 - carvedilol (COREG) 6.25 MG tablet; TAKE 1 TABLET (6.25 MG TOTAL) BY MOUTH 2 (TWO) TIMES DAILY WITH A MEAL.  Dispense: 60 tablet; Refill: 6 - losartan (COZAAR) 100 MG tablet; Take 1 tablet (100 mg total) by mouth daily.  Dispense: 90 tablet; Refill: 1 - CMP14+EGFR  3. Other insomnia Stable - traZODone (DESYREL) 50 MG tablet; Take 1 tablet (50 mg total) by mouth at bedtime.  Dispense: 90 tablet; Refill: 1  4. Other chronic sinusitis Uncontrolled Advised to use Tylenol for headaches Adherence with Zyrtec emphasized - cetirizine (ZYRTEC) 10 MG tablet; TAKE 1 TABLET (10 MG TOTAL) BY MOUTH DAILY.  Dispense: 30 tablet; Refill: 1   Health Care Maintenance: Shingrix at next visit Meds ordered this encounter  Medications   spironolactone (ALDACTONE) 25 MG tablet    Sig: Take 1 tablet (25 mg total) by mouth daily.    Dispense:  90 tablet    Refill:  1    Discontinue Potassium   atorvastatin (LIPITOR) 20 MG tablet    Sig: Take 1 tablet (20 mg total) by mouth daily.    Dispense:  90 tablet    Refill:  1   carvedilol (COREG) 6.25 MG tablet    Sig: TAKE 1 TABLET (6.25 MG TOTAL) BY MOUTH 2 (TWO) TIMES DAILY WITH A MEAL.    Dispense:  60 tablet    Refill:  6   traZODone (DESYREL)  50 MG tablet    Sig: Take 1 tablet (50 mg total) by mouth at bedtime.    Dispense:  90 tablet    Refill:  1    Dose decrease   cetirizine (ZYRTEC) 10 MG tablet    Sig: TAKE 1 TABLET (10 MG TOTAL) BY MOUTH DAILY.    Dispense:  30 tablet    Refill:  1   losartan (COZAAR) 100 MG tablet    Sig: Take 1 tablet (100 mg total) by mouth daily.    Dispense:  90 tablet    Refill:  1    Discontinue HCTZ    Follow-up: Return in about 6 months (around 11/07/2021) for medical conditions.       Charlott Rakes, MD, FAAFP. [facility] [place], [place]   05/08/2021, 12:25 PM

## 2021-05-09 LAB — LIPID PANEL
Chol/HDL Ratio: 5.5 ratio — ABNORMAL HIGH (ref 0.0–4.4)
Cholesterol, Total: 166 mg/dL (ref 100–199)
HDL: 30 mg/dL — ABNORMAL LOW (ref >39)
LDL Chol Calc (NIH): 93 mg/dL (ref 0–99)
Triglycerides: 255 mg/dL — ABNORMAL HIGH (ref 0–149)
VLDL Cholesterol Cal: 43 mg/dL — ABNORMAL HIGH (ref 5–40)

## 2021-05-09 LAB — CMP14+EGFR
ALT: 22 IU/L (ref 0–32)
AST: 28 IU/L (ref 0–40)
Albumin/Globulin Ratio: 1.5 (ref 1.2–2.2)
Albumin: 4.2 g/dL (ref 3.8–4.9)
Alkaline Phosphatase: 141 IU/L — ABNORMAL HIGH (ref 44–121)
BUN/Creatinine Ratio: 8 — ABNORMAL LOW (ref 9–23)
BUN: 8 mg/dL (ref 6–24)
Bilirubin Total: 1.1 mg/dL (ref 0.0–1.2)
CO2: 23 mmol/L (ref 20–29)
Calcium: 9.1 mg/dL (ref 8.7–10.2)
Chloride: 104 mmol/L (ref 96–106)
Creatinine, Ser: 0.99 mg/dL (ref 0.57–1.00)
Globulin, Total: 2.8 g/dL (ref 1.5–4.5)
Glucose: 83 mg/dL (ref 65–99)
Potassium: 3.8 mmol/L (ref 3.5–5.2)
Sodium: 141 mmol/L (ref 134–144)
Total Protein: 7 g/dL (ref 6.0–8.5)
eGFR: 69 mL/min/{1.73_m2} (ref >59)

## 2021-05-24 NOTE — Progress Notes (Signed)
Letter has been mailed to patient to call office for lab results.

## 2021-05-29 ENCOUNTER — Telehealth: Payer: Self-pay | Admitting: Family Medicine

## 2021-05-29 NOTE — Telephone Encounter (Signed)
Copied from [place] [phone]. Topic: General - Other >> May 29, 2021 11:43 AM Pawlus, Brayton Layman A wrote: Reason for CRM: Pt wanted to go over her latest lab results, please call back [phone].

## 2021-05-30 ENCOUNTER — Ambulatory Visit: Payer: Self-pay | Admitting: *Deleted

## 2021-05-30 ENCOUNTER — Ambulatory Visit: Payer: Self-pay | Admitting: Family Medicine

## 2021-05-30 NOTE — Telephone Encounter (Signed)
Call placed to patient and a VM was left informing patient to return phone call for lab results.

## 2021-05-30 NOTE — Telephone Encounter (Signed)
I return Pt call, she said that she still waiting for her paperwork to be complete and she will call back to schedule a financial appt

## 2021-05-30 NOTE — Telephone Encounter (Signed)
Pt daughter called with patient and given lab results per notes of Dr. Margarita Rana from 05/09/21 on 05/30/21. Pt  and daughter verbalized understanding and reports patient is already taking fish oil. Patient requesting appt with orange card . Transferred patient call to clinic.

## 2021-06-05 ENCOUNTER — Other Ambulatory Visit: Payer: Self-pay

## 2021-06-06 ENCOUNTER — Other Ambulatory Visit: Payer: Self-pay

## 2021-06-16 ENCOUNTER — Ambulatory Visit: Payer: Medicaid Other

## 2021-06-20 ENCOUNTER — Other Ambulatory Visit: Payer: Self-pay

## 2021-06-28 ENCOUNTER — Other Ambulatory Visit: Payer: Self-pay

## 2021-06-28 ENCOUNTER — Ambulatory Visit: Payer: Self-pay | Attending: Family Medicine

## 2021-07-06 ENCOUNTER — Emergency Department (HOSPITAL_COMMUNITY)
Admission: EM | Admit: 2021-07-06 | Discharge: 2021-07-06 | Disposition: A | Discharge status: Home / Self Care | Payer: Medicaid Other | Attending: Emergency Medicine | Admitting: Emergency Medicine

## 2021-07-06 DIAGNOSIS — R21 Rash and other nonspecific skin eruption: Secondary | ICD-10-CM | POA: Insufficient documentation

## 2021-07-06 DIAGNOSIS — R1013 Epigastric pain: Secondary | ICD-10-CM | POA: Insufficient documentation

## 2021-07-06 DIAGNOSIS — R079 Chest pain, unspecified: Secondary | ICD-10-CM | POA: Insufficient documentation

## 2021-07-06 DIAGNOSIS — Z79899 Other long term (current) drug therapy: Secondary | ICD-10-CM | POA: Insufficient documentation

## 2021-07-06 DIAGNOSIS — I10 Essential (primary) hypertension: Secondary | ICD-10-CM | POA: Insufficient documentation

## 2021-07-06 LAB — CBC WITH DIFFERENTIAL/PLATELET
Abs Immature Granulocytes: 0.05 10*3/uL (ref 0.00–0.07)
Basophils Absolute: 0 10*3/uL (ref 0.0–0.1)
Basophils Relative: 0 %
Eosinophils Absolute: 0.7 10*3/uL — ABNORMAL HIGH (ref 0.0–0.5)
Eosinophils Relative: 7 %
HCT: 34.5 % — ABNORMAL LOW (ref 36.0–46.0)
Hemoglobin: 12 g/dL (ref 12.0–15.0)
Immature Granulocytes: 1 %
Lymphocytes Relative: 25 %
Lymphs Abs: 2.6 10*3/uL (ref 0.7–4.0)
MCH: 34.1 pg — ABNORMAL HIGH (ref 26.0–34.0)
MCHC: 34.8 g/dL (ref 30.0–36.0)
MCV: 98 fL (ref 80.0–100.0)
Monocytes Absolute: 0.7 10*3/uL (ref 0.1–1.0)
Monocytes Relative: 7 %
Neutro Abs: 6.2 10*3/uL (ref 1.7–7.7)
Neutrophils Relative %: 60 %
Platelets: 273 10*3/uL (ref 150–400)
RBC: 3.52 MIL/uL — ABNORMAL LOW (ref 3.87–5.11)
RDW: 13 % (ref 11.5–15.5)
WBC: 10.3 10*3/uL (ref 4.0–10.5)
nRBC: 0 % (ref 0.0–0.2)

## 2021-07-06 LAB — COMPREHENSIVE METABOLIC PANEL
ALT: 23 U/L (ref 0–44)
AST: 26 U/L (ref 15–41)
Albumin: 3.6 g/dL (ref 3.5–5.0)
Alkaline Phosphatase: 112 U/L (ref 38–126)
Anion gap: 7 (ref 5–15)
BUN: 10 mg/dL (ref 6–20)
CO2: 23 mmol/L (ref 22–32)
Calcium: 9.2 mg/dL (ref 8.9–10.3)
Chloride: 108 mmol/L (ref 98–111)
Creatinine, Ser: 0.84 mg/dL (ref 0.44–1.00)
GFR, Estimated: >60 mL/min (ref >60)
Glucose, Bld: 133 mg/dL — ABNORMAL HIGH (ref 70–99)
Potassium: 4.1 mmol/L (ref 3.5–5.1)
Sodium: 138 mmol/L (ref 135–145)
Total Bilirubin: 0.7 mg/dL (ref 0.3–1.2)
Total Protein: 7.1 g/dL (ref 6.5–8.1)

## 2021-07-06 LAB — TROPONIN I (HIGH SENSITIVITY)
Troponin I (High Sensitivity): 5 ng/L (ref <18)
Troponin I (High Sensitivity): 5 ng/L (ref <18)

## 2021-07-06 LAB — LIPASE, BLOOD: Lipase: 64 U/L — ABNORMAL HIGH (ref 11–51)

## 2021-07-06 MED ORDER — HYDROCORTISONE 2.5 % EX LOTN: TOPICAL_LOTION | Freq: Two times a day (BID) | CUTANEOUS | 0 refills | Status: DC

## 2021-07-06 MED ORDER — HYDROXYZINE HCL 25 MG PO TABS
25.0000 mg | ORAL_TABLET | Freq: Once | ORAL | Status: AC
  Administered 2021-07-06: 25 mg via ORAL
  Filled 2021-07-06: qty 1

## 2021-07-06 MED ORDER — HYDROCORTISONE 2.5 % EX LOTN
TOPICAL_LOTION | Freq: Two times a day (BID) | CUTANEOUS | 0 refills | Status: DC
  Filled 2021-07-06: qty 59, fill #0

## 2021-07-06 NOTE — ED Provider Notes (Signed)
Emergency Medicine Provider Triage Evaluation Note  Alice Woodard , a 50 y.o. female  was evaluated in triage.  Pt complains of rash.  Review of Systems  Positive: Itchy rash, abdominal discomfort, feeling of heaviness, swelling of eyelids Negative: Fever, joint pain, wheezing  Physical Exam  BP 118/84 (BP Location: Right Arm)   Pulse 69   Temp 98.6 F (37 C)   Resp 18   LMP 11/15/2014   SpO2 99%  Gen:   Awake, no distress   Resp:  Normal effort  MSK:   Moves extremities without difficulty  Other:  Maculopapular rash scattered throughout all extremities and abdomen with excoriation marks.  No signs of infection  Medical Decision Making  Medically screening exam initiated at 4:37 PM.  Appropriate orders placed.  Alice Woodard was informed that the remainder of the evaluation will be completed by another provider, this initial triage assessment does not replace that evaluation, and the importance of remaining in the ED until their evaluation is complete.  Pt here with itchy rash x 5 days.  Also endorse epigastric discomfort and feeling of heaviness.  No enviromental changes, medication changes, new drugs, soap, detergents or new pets.  No one else at home with same.  No treatment tried.  Hx obtained through language interpreter.    Alice Moras, PA-C 07/06/21 1640    Alice Natal, MD 07/06/21 [phone]

## 2021-07-06 NOTE — Discharge Instructions (Addendum)
Apply the lotion to your rash twice daily for the next week.  Follow-up with your doctor in 2 or 3 days.  Your blood tests did not show any worrisome findings today.  Return back to the ER if you get fevers or vomiting or worsening symptoms.

## 2021-07-06 NOTE — ED Provider Notes (Signed)
[facility] EMERGENCY DEPARTMENT Provider Note   CSN: [id number] Arrival date & time: 07/06/21  1526     History Chief Complaint  Patient presents with   Rash    Alice Woodard is a 50 y.o. female.  Patient presents to ER with several complaints.  History obtained with translator iPad.  Patient states that she has noticed a rash to her extremities ongoing for several days.  She is taking over-the-counter medications without relief from the itchiness.  She also noticed a sensation of heaviness and discomfort in the epigastric region ongoing for 2 days.  No associated fevers no cough no vomiting no diarrhea.  Complaining of some chest tightness as well.  She also states she is noticed some heaviness sensation in bilateral eyelids.  Denies fevers or cough.  No vomiting no diarrhea.      Past Medical History:  Diagnosis Date   Anemia    History of blood transfusion 04/17/2017   "anemic"   Hyperlipidemia    Hypertension     Patient Active Problem List   Diagnosis Date Noted   Ulcer of the duodenum caused by bacteria (H. pylori) 04/19/2017   Gastrointestinal hemorrhage associated with duodenal ulcer    Upper GI bleed    Symptomatic anemia 04/17/2017   Carpal tunnel syndrome 01/25/2017   Gastritis and gastroduodenitis 05/04/2016   Achilles tendinitis of right lower extremity 05/04/2016   Healthcare maintenance 12/29/2015   SOB (shortness of breath) 12/29/2015   Blurry vision, bilateral 11/18/2014   Dyslipidemia 05/24/2014   Essential hypertension, benign 05/24/2014    Past Surgical History:  Procedure Laterality Date   APPENDECTOMY     CHOLECYSTECTOMY OPEN     ESOPHAGOGASTRODUODENOSCOPY N/A 04/18/2017   Procedure: ESOPHAGOGASTRODUODENOSCOPY (EGD);  Surgeon: Mauri Pole, MD;  Location: [facility] ENDOSCOPY;  Service: Endoscopy;  Laterality: N/A;   UPPER GASTROINTESTINAL ENDOSCOPY  04/2017     OB History   No obstetric history on file.     Family  History  Problem Relation Age of Onset   Cancer - Other Mother        blood   Colon cancer Neg Hx    Esophageal cancer Neg Hx    Rectal cancer Neg Hx    Stomach cancer Neg Hx     Social History   Tobacco Use   Smoking status: Never   Smokeless tobacco: Never  Vaping Use   Vaping Use: Never used  Substance Use Topics   Alcohol use: No   Drug use: No    Home Medications Prior to Admission medications   Medication Sig Start Date End Date Taking? Authorizing Provider  atorvastatin (LIPITOR) 20 MG tablet Take 1 tablet (20 mg total) by mouth daily. 05/08/21   Charlott Rakes, MD  benzonatate (TESSALON) 100 MG capsule Take 1 capsule (100 mg total) by mouth 3 (three) times daily. Patient not taking: No sig reported 08/17/19   Charlott Rakes, MD  carvedilol (COREG) 6.25 MG tablet TAKE 1 TABLET (6.25 MG TOTAL) BY MOUTH 2 (TWO) TIMES DAILY WITH A MEAL. 05/08/21 05/08/22  Charlott Rakes, MD  cetirizine (ZYRTEC) 10 MG tablet TAKE 1 TABLET (10 MG TOTAL) BY MOUTH DAILY. 05/08/21 05/08/22  Charlott Rakes, MD  cloNIDine (CATAPRES) 0.1 MG tablet Take 1 tablet (0.1 mg total) by mouth at bedtime. For hotflashes 09/13/20   Charlott Rakes, MD  diclofenac Sodium (VOLTAREN) 1 % GEL Apply 4 g topically 4 (four) times daily. 03/30/21   Charlott Rakes, MD  gabapentin (NEURONTIN)  300 MG capsule TAKE 1 CAPSULE (300 MG TOTAL) BY MOUTH AT BEDTIME. 09/13/20 09/13/21  Charlott Rakes, MD  hydrocortisone 2.5 % lotion Apply topically 2 (two) times daily. Apply to affected areas twice daily. 07/06/21   Luna Fuse, MD  Hyoscyamine Sulfate SL (LEVSIN/SL) 0.125 MG SUBL Place 0.125 mg under the tongue 2 (two) times daily as needed. 04/21/20   Mauri Pole, MD  losartan (COZAAR) 100 MG tablet Take 1 tablet (100 mg total) by mouth daily. 05/08/21   Charlott Rakes, MD  pantoprazole (PROTONIX) 40 MG tablet TAKE 1 TABLET (40 MG TOTAL) BY MOUTH 2 (TWO) TIMES DAILY. 04/21/20 04/21/21  Mauri Pole, MD  polyethylene  glycol powder (GLYCOLAX/MIRALAX) 17 GM/SCOOP powder Use 1 capful two times daily as needed. 04/21/20   Mauri Pole, MD  spironolactone (ALDACTONE) 25 MG tablet Take 1 tablet (25 mg total) by mouth daily. 05/08/21   Charlott Rakes, MD  traZODone (DESYREL) 50 MG tablet Take 1 tablet (50 mg total) by mouth at bedtime. 05/08/21 05/08/22  Charlott Rakes, MD  zolpidem (AMBIEN) 5 MG tablet Take 1 tablet (5 mg total) by mouth at bedtime as needed for sleep. Patient not taking: No sig reported 07/03/19   Veryl Speak, MD    Allergies    Amlodipine  Review of Systems   Review of Systems  Constitutional:  Negative for fever.  HENT:  Negative for ear pain.   Eyes:  Negative for pain.  Respiratory:  Negative for cough.   Cardiovascular:  Positive for chest pain.  Gastrointestinal:  Positive for abdominal pain.  Genitourinary:  Negative for flank pain.  Musculoskeletal:  Negative for back pain.  Skin:  Positive for rash.  Neurological:  Negative for headaches.   Physical Exam Updated Vital Signs BP 112/88   Pulse (!) 54   Temp 98.6 F (37 C)   Resp 18   LMP 11/15/2014   SpO2 98%   Physical Exam Constitutional:      General: She is not in acute distress.    Appearance: Normal appearance.  HENT:     Head: Normocephalic.     Nose: Nose normal.  Eyes:     Extraocular Movements: Extraocular movements intact.  Cardiovascular:     Rate and Rhythm: Normal rate.  Pulmonary:     Effort: Pulmonary effort is normal.  Musculoskeletal:        General: Normal range of motion.     Cervical back: Normal range of motion.  Skin:    Comments: Mild sparse dermatitis to bilateral lower extremities.  No evidence of cellulitis or secondary infection.  Neurological:     General: No focal deficit present.     Mental Status: She is alert. Mental status is at baseline.    ED Results / Procedures / Treatments   Labs (all labs ordered are listed, but only abnormal results are displayed) Labs  Reviewed  CBC WITH DIFFERENTIAL/PLATELET - Abnormal; Notable for the following components:      Result Value   RBC 3.52 (*)    HCT 34.5 (*)    MCH 34.1 (*)    Eosinophils Absolute 0.7 (*)    All other components within normal limits  COMPREHENSIVE METABOLIC PANEL - Abnormal; Notable for the following components:   Glucose, Bld 133 (*)    All other components within normal limits  LIPASE, BLOOD - Abnormal; Notable for the following components:   Lipase 64 (*)    All other components within normal limits  TROPONIN I (HIGH SENSITIVITY)  TROPONIN I (HIGH SENSITIVITY)    EKG EKG Interpretation  Date/Time:  Thursday July 06 2021 18:26:15 EDT Ventricular Rate:  62 PR Interval:  145 QRS Duration: 154 QT Interval:  445 QTC Calculation: 452 R Axis:   24 Text Interpretation: Sinus rhythm Nonspecific intraventricular conduction delay Confirmed by Thamas Jaegers ([id number]) on 07/06/2021 6:56:01 PM  Radiology No results found.  Procedures Procedures   Medications Ordered in ED Medications  hydrOXYzine (ATARAX/VISTARIL) tablet 25 mg (25 mg Oral Given 07/06/21 1648)    ED Course  I have reviewed the triage vital signs and the nursing notes.  Pertinent labs & imaging results that were available during my care of the patient were reviewed by me and considered in my medical decision making (see chart for details).    MDM Rules/Calculators/A&P                           Labs unremarkable.  Troponins are negative x2.  EKG shows sinus rhythm no ST elevations depressions no T wave inversions noted.  Patient will be given cortisone cream for her rash, advised continued outpatient follow-up with her doctor within the week.  Advising immediate return for worsening pain fevers or any additional concerns.  Final Clinical Impression(s) / ED Diagnoses Final diagnoses:  Rash  Chest pain, unspecified type    Rx / DC Orders ED Discharge Orders          Ordered    hydrocortisone 2.5 % lotion   2 times daily,   Status:  Discontinued        07/06/21 2210    hydrocortisone 2.5 % lotion  2 times daily        07/06/21 2210             Luna Fuse, MD 07/06/21 2210

## 2021-07-06 NOTE — ED Triage Notes (Signed)
Pt c/o rash on bilateral arms, legs x5 days. Denies new soaps, detergents, fragrances, exposure to allergens.  Endorses HA, feeling of "heaviness," epigastric pain & swelling of eyes. Airway intact, pt denies SHOB. No OTC medication used at home

## 2021-07-07 ENCOUNTER — Other Ambulatory Visit: Payer: Self-pay

## 2021-07-13 ENCOUNTER — Ambulatory Visit (INDEPENDENT_AMBULATORY_CARE_PROVIDER_SITE_OTHER): Payer: Self-pay | Admitting: Gastroenterology

## 2021-07-13 ENCOUNTER — Encounter: Payer: Self-pay | Admitting: Gastroenterology

## 2021-07-13 ENCOUNTER — Other Ambulatory Visit: Payer: Self-pay

## 2021-07-13 VITALS — BP 126/86 | HR 50 | Ht <= 58 in | Wt 125.5 lb

## 2021-07-13 DIAGNOSIS — K5904 Chronic idiopathic constipation: Secondary | ICD-10-CM

## 2021-07-13 DIAGNOSIS — K2990 Gastroduodenitis, unspecified, without bleeding: Secondary | ICD-10-CM

## 2021-07-13 DIAGNOSIS — R21 Rash and other nonspecific skin eruption: Secondary | ICD-10-CM

## 2021-07-13 DIAGNOSIS — K2970 Gastritis, unspecified, without bleeding: Secondary | ICD-10-CM

## 2021-07-13 DIAGNOSIS — R109 Unspecified abdominal pain: Secondary | ICD-10-CM

## 2021-07-13 DIAGNOSIS — R1013 Epigastric pain: Secondary | ICD-10-CM

## 2021-07-13 MED ORDER — PANTOPRAZOLE SODIUM 40 MG PO TBEC
40.0000 mg | DELAYED_RELEASE_TABLET | Freq: Every day | ORAL | 3 refills | Status: DC
  Filled 2021-07-13: qty 30, 30d supply, fill #0
  Filled 2021-08-11: qty 30, 30d supply, fill #1
  Filled 2021-09-28: qty 30, 30d supply, fill #2
  Filled 2021-10-31: qty 30, 30d supply, fill #3

## 2021-07-13 NOTE — Patient Instructions (Addendum)
You have been scheduled for an endoscopy. Please follow written instructions given to you at your visit today. If you use inhalers (even only as needed), please bring them with you on the day of your procedure.   Due to recent changes in healthcare laws, you may see the results of your imaging and laboratory studies on MyChart before your provider has had a chance to review them.  We understand that in some cases there may be results that are confusing or concerning to you. Not all laboratory results come back in the same time frame and the provider may be waiting for multiple results in order to interpret others.  Please give [place] 48 hours in order for your provider to thoroughly review all the results before contacting the office for clarification of your results.    If you are age 52 or older, your body mass index should be between 23-30. Your Body mass index is 26.23 kg/m. If this is out of the aforementioned range listed, please consider follow up with your Primary Care Provider.  If you are age 52 or younger, your body mass index should be between 19-25. Your Body mass index is 26.23 kg/m. If this is out of the aformentioned range listed, please consider follow up with your Primary Care Provider.   __________________________________________________________  The [facility] providers would like to encourage you to use [facility] to communicate with providers for non-urgent requests or questions.  Due to long hold times on the telephone, sending your provider a message by [facility] may be a faster and more efficient way to get a response.  Please allow 48 business hours for a response.  Please remember that this is for non-urgent requests.    We will send Pantoprazole to your pharmacy  Take Miralax 1 capful daily  Use hydrocortisone cream OTC on skin rash, call PMD if not better  Gastroesophageal Reflux Disease, Adult Gastroesophageal reflux (GER) happens when acid from the stomach flows up into  the tube that connects the mouth and the stomach (esophagus). Normally, food travels down the esophagus and stays in the stomach to be digested. However, when a person has GER, food and stomach acid sometimes move back up into the esophagus. If this becomes a more serious problem, the person may be diagnosed with a disease called gastroesophageal reflux disease (GERD). GERD occurs when the reflux: Happens often. Causes frequent or severe symptoms. Causes problems such as damage to the esophagus. When stomach acid comes in contact with the esophagus, the acid may cause inflammation in the esophagus. Over time, GERD may create small holes (ulcers) in the lining of the esophagus. What are the causes? This condition is caused by a problem with the muscle between the esophagus and the stomach (lower esophageal sphincter, or LES). Normally, the LES muscle closes after food passes through the esophagus to the stomach. When the LES is weakened or abnormal, it does not close properly, and that allows food and stomach acid to go back up into the esophagus. The LES can be weakened by certain dietary substances, medicines, and medical conditions, including: Tobacco use. Pregnancy. Having a hiatal hernia. Alcohol use. Certain foods and beverages, such as coffee, chocolate, onions, and peppermint. What increases the risk? You are more likely to develop this condition if you: Have an increased body weight. Have a connective tissue disorder. Take NSAIDs, such as ibuprofen. What are the signs or symptoms? Symptoms of this condition include: Heartburn. Difficult or painful swallowing and the feeling of  having a lump in the throat. A bitter taste in the mouth. Bad breath and having a large amount of saliva. Having an upset or bloated stomach and belching. Chest pain. Different conditions can cause chest pain. Make sure you see your health care provider if you experience chest pain. Shortness of breath or  wheezing. Ongoing (chronic) cough or a nighttime cough. Wearing away of tooth enamel. Weight loss. How is this diagnosed? This condition may be diagnosed based on a medical history and a physical exam. To determine if you have mild or severe GERD, your health care provider may also monitor how you respond to treatment. You may also have tests, including: A test to examine your stomach and esophagus with a small camera (endoscopy). A test that measures the acidity level in your esophagus. A test that measures how much pressure is on your esophagus. A barium swallow or modified barium swallow test to show the shape, size, and functioning of your esophagus. How is this treated? Treatment for this condition may vary depending on how severe your symptoms are. Your health care provider may recommend: Changes to your diet. Medicine. Surgery. The goal of treatment is to help relieve your symptoms and to prevent complications. Follow these instructions at home: Eating and drinking  Follow a diet as recommended by your health care provider. This may involve avoiding foods and drinks such as: Coffee and tea, with or without caffeine. Drinks that contain alcohol. Energy drinks and sports drinks. Carbonated drinks or sodas. Chocolate and cocoa. Peppermint and mint flavorings. Garlic and onions. Horseradish. Spicy and acidic foods, including peppers, chili powder, curry powder, vinegar, hot sauces, and barbecue sauce. Citrus fruit juices and citrus fruits, such as oranges, lemons, and limes. Tomato-based foods, such as red sauce, chili, salsa, and pizza with red sauce. Fried and fatty foods, such as donuts, french fries, potato chips, and high-fat dressings. High-fat meats, such as hot dogs and fatty cuts of red and white meats, such as rib eye steak, sausage, ham, and bacon. High-fat dairy items, such as whole milk, butter, and cream cheese. Eat small, frequent meals instead of large  meals. Avoid drinking large amounts of liquid with your meals. Avoid eating meals during the 2-3 hours before bedtime. Avoid lying down right after you eat. Do not exercise right after you eat. Lifestyle  Do not use any products that contain nicotine or tobacco. These products include cigarettes, chewing tobacco, and vaping devices, such as e-cigarettes. If you need help quitting, ask your health care provider. Try to reduce your stress by using methods such as yoga or meditation. If you need help reducing stress, ask your health care provider. If you are overweight, reduce your weight to an amount that is healthy for you. Ask your health care provider for guidance about a safe weight loss goal. General instructions Pay attention to any changes in your symptoms. Take over-the-counter and prescription medicines only as told by your health care provider. Do not take aspirin, ibuprofen, or other NSAIDs unless your health care provider told you to take these medicines. Wear loose-fitting clothing. Do not wear anything tight around your waist that causes pressure on your abdomen. Raise (elevate) the head of your bed about 6 inches (15 cm). You can use a wedge to do this. Avoid bending over if this makes your symptoms worse. Keep all follow-up visits. This is important. Contact a health care provider if: You have: New symptoms. Unexplained weight loss. Difficulty swallowing or it hurts to swallow.  Wheezing or a persistent cough. A hoarse voice. Your symptoms do not improve with treatment. Get help right away if: You have sudden pain in your arms, neck, jaw, teeth, or back. You suddenly feel sweaty, dizzy, or light-headed. You have chest pain or shortness of breath. You vomit and the vomit is green, yellow, or black, or it looks like blood or coffee grounds. You faint. You have stool that is red, bloody, or black. You cannot swallow, drink, or eat. These symptoms may represent a serious  problem that is an emergency. Do not wait to see if the symptoms will go away. Get medical help right away. Call your local emergency services (911 in the [place] Do not drive yourself to the hospital. Summary Gastroesophageal reflux happens when acid from the stomach flows up into the esophagus. GERD is a disease in which the reflux happens often, causes frequent or severe symptoms, or causes problems such as damage to the esophagus. Treatment for this condition may vary depending on how severe your symptoms are. Your health care provider may recommend diet and lifestyle changes, medicine, or surgery. Contact a health care provider if you have new or worsening symptoms. Take over-the-counter and prescription medicines only as told by your health care provider. Do not take aspirin, ibuprofen, or other NSAIDs unless your health care provider told you to do so. Keep all follow-up visits as told by your health care provider. This is important. This information is not intended to replace advice given to you by your health care provider. Make sure you discuss any questions you have with your health care provider. Document Revised: 05/09/2020 Document Reviewed: 05/09/2020 Elsevier Patient Education  [place].    Follow up in 6 months  I appreciate the  opportunity to care for you  Thank You   Harl Bowie , MD

## 2021-07-13 NOTE — Progress Notes (Signed)
Alice Woodard    [id number]    July 22, 1971  Primary Care Physician:Newlin, Charlane Ferretti, MD  Referring Physician: Charlott Rakes, MD [place],  [place] [id number]   Chief complaint:  Epigastric abdominal pain   HPI:  50 year old female here for follow-up visit accompanied by language interpreter  with complaints of epigastric abdominal pain  She has constant discomfort in the epigastric area, sometimes worse after she eats.  Denies any vomiting, has intermittent regurgitation and nausea.  She is S/p cholecystectomy  She has chronic constipation with bowel movements 3-4 times a week.  She feels upper abdominal fullness when she does not have a bowel movement, she feels it is different than the epigastric pain that she is experiencing  Denies any melena, rectal bleeding, decreased appetite or unintentional weight loss   EGD 04/18/2017: Showed multiple duodenal ulcers, H. pylori positive.   She was treated with antibiotics for H. pylori   H.pylori stool Antigen negative 06/26/2017, confirmed eradication.   Colonoscopy May 20, 2019: 5 sessile diminutive polyps removed, only one of them was tubular adenoma the rest hyperplastic.  Recommended recall colonoscopy in 7 years   CT abdomen pelvis with contrast December 07, 2017 for abdominal pain:   No acute findings.  Altered profusion and atrophy of left lobe of liver attributed to possible old vascular insult otherwise unremarkable exam      Outpatient Encounter Medications as of 07/13/2021  Medication Sig   atorvastatin (LIPITOR) 20 MG tablet Take 1 tablet (20 mg total) by mouth daily.   carvedilol (COREG) 6.25 MG tablet TAKE 1 TABLET (6.25 MG TOTAL) BY MOUTH 2 (TWO) TIMES DAILY WITH A MEAL.   cetirizine (ZYRTEC) 10 MG tablet TAKE 1 TABLET (10 MG TOTAL) BY MOUTH DAILY.   cloNIDine (CATAPRES) 0.1 MG tablet Take 1 tablet (0.1 mg total) by mouth at bedtime. For hotflashes   diclofenac Sodium (VOLTAREN) 1 % GEL  Apply 4 g topically 4 (four) times daily.   gabapentin (NEURONTIN) 300 MG capsule TAKE 1 CAPSULE (300 MG TOTAL) BY MOUTH AT BEDTIME.   hydrocortisone 2.5 % lotion Apply topically 2 (two) times daily. Apply to affected areas twice daily.   Hyoscyamine Sulfate SL (LEVSIN/SL) 0.125 MG SUBL Place 0.125 mg under the tongue 2 (two) times daily as needed.   losartan (COZAAR) 100 MG tablet Take 1 tablet (100 mg total) by mouth daily.   pantoprazole (PROTONIX) 40 MG tablet TAKE 1 TABLET (40 MG TOTAL) BY MOUTH 2 (TWO) TIMES DAILY.   polyethylene glycol powder (GLYCOLAX/MIRALAX) 17 GM/SCOOP powder Use 1 capful two times daily as needed.   spironolactone (ALDACTONE) 25 MG tablet Take 1 tablet (25 mg total) by mouth daily.   traZODone (DESYREL) 50 MG tablet Take 1 tablet (50 mg total) by mouth at bedtime.   zolpidem (AMBIEN) 5 MG tablet Take 1 tablet (5 mg total) by mouth at bedtime as needed for sleep.   [DISCONTINUED] benzonatate (TESSALON) 100 MG capsule Take 1 capsule (100 mg total) by mouth 3 (three) times daily. (Patient not taking: No sig reported)   Facility-Administered Encounter Medications as of 07/13/2021  Medication   0.9 %  sodium chloride infusion    Allergies as of 07/13/2021 - Review Complete 07/13/2021  Allergen Reaction Noted   Amlodipine Rash 04/17/2017    Past Medical History:  Diagnosis Date   Anemia    History of blood transfusion 04/17/2017   "anemic"   Hyperlipidemia  Hypertension     Past Surgical History:  Procedure Laterality Date   APPENDECTOMY     CHOLECYSTECTOMY OPEN     ESOPHAGOGASTRODUODENOSCOPY N/A 04/18/2017   Procedure: ESOPHAGOGASTRODUODENOSCOPY (EGD);  Surgeon: Mauri Pole, MD;  Location: [facility] ENDOSCOPY;  Service: Endoscopy;  Laterality: N/A;   UPPER GASTROINTESTINAL ENDOSCOPY  04/2017    Family History  Problem Relation Age of Onset   Cancer - Other Mother        blood   Colon cancer Neg Hx    Esophageal cancer Neg Hx    Rectal cancer Neg Hx     Stomach cancer Neg Hx     Social History   Socioeconomic History   Marital status: Single    Spouse name: Not on file   Number of children: 2   Years of education: Not on file   Highest education level: Not on file  Occupational History   Not on file  Tobacco Use   Smoking status: Never   Smokeless tobacco: Never  Vaping Use   Vaping Use: Never used  Substance and Sexual Activity   Alcohol use: No   Drug use: No   Sexual activity: Not on file  Other Topics Concern   Not on file  Social History Narrative   Not on file   Social Determinants of Health   Financial Resource Strain: Not on file  Food Insecurity: Not on file  Transportation Needs: Not on file  Physical Activity: Not on file  Stress: Not on file  Social Connections: Not on file  Intimate Partner Violence: Not on file      Review of systems: All other review of systems negative except as mentioned in the HPI.   Physical Exam: Vitals:   07/13/21 0910  BP: 126/86  Pulse: (!) 50  SpO2: 99%   Body mass index is 26.23 kg/m. 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 All questions answered. The patient agrees to proceed.    GERD: Continue Protonix 40 mg daily Antireflux measures   Chronic idiopathic constipation:  Continue Benefiber and MiraLAX as needed with goal 1-2 soft bowel movements daily Increase dietary fiber and water intake   History of adenomatous colon polyps, due for recall colonoscopy July 2027    This visit required >40 minutes of patient care (this includes precharting, chart review, review of results, face-to-face time used for counseling as well as treatment plan and follow-up. The patient was provided an opportunity to ask questions and all were answered. The patient agreed with the plan and demonstrated an understanding of the instructions.  Damaris Hippo , MD    CC: Charlott Rakes, MD

## 2021-07-14 ENCOUNTER — Encounter: Payer: Self-pay | Admitting: Gastroenterology

## 2021-07-14 ENCOUNTER — Other Ambulatory Visit: Payer: Self-pay

## 2021-07-16 ENCOUNTER — Emergency Department (HOSPITAL_COMMUNITY)
Admission: EM | Admit: 2021-07-16 | Discharge: 2021-07-16 | Disposition: A | Discharge status: Home / Self Care | Payer: Medicaid Other | Attending: Emergency Medicine | Admitting: Emergency Medicine

## 2021-07-16 ENCOUNTER — Encounter (HOSPITAL_COMMUNITY): Payer: Self-pay | Admitting: Emergency Medicine

## 2021-07-16 DIAGNOSIS — R21 Rash and other nonspecific skin eruption: Secondary | ICD-10-CM | POA: Insufficient documentation

## 2021-07-16 DIAGNOSIS — Z79899 Other long term (current) drug therapy: Secondary | ICD-10-CM | POA: Insufficient documentation

## 2021-07-16 DIAGNOSIS — I10 Essential (primary) hypertension: Secondary | ICD-10-CM | POA: Insufficient documentation

## 2021-07-16 MED ORDER — PREDNISONE 20 MG PO TABS
60.0000 mg | ORAL_TABLET | Freq: Once | ORAL | Status: AC
  Administered 2021-07-16: 60 mg via ORAL
  Filled 2021-07-16: qty 3

## 2021-07-16 MED ORDER — DIPHENHYDRAMINE HCL 25 MG PO CAPS
25.0000 mg | ORAL_CAPSULE | Freq: Four times a day (QID) | ORAL | 0 refills | Status: DC | PRN
  Filled 2021-07-16: qty 20, 5d supply, fill #0

## 2021-07-16 MED ORDER — METHYLPREDNISOLONE 4 MG PO TBPK
ORAL_TABLET | ORAL | 0 refills | Status: DC
  Filled 2021-07-16: qty 21, 6d supply, fill #0

## 2021-07-16 MED ORDER — DIPHENHYDRAMINE HCL 25 MG PO CAPS
50.0000 mg | ORAL_CAPSULE | Freq: Once | ORAL | Status: AC
  Administered 2021-07-16: 50 mg via ORAL
  Filled 2021-07-16: qty 2

## 2021-07-16 NOTE — ED Provider Notes (Signed)
[facility] EMERGENCY DEPARTMENT Provider Note   CSN: [id number] Arrival date & time: 07/16/21  1642     History No chief complaint on file.   Alice Woodard is a 50 y.o. female.  HPI     Pt comes in with cc of rash. Rash present x 1 month in both of her upper and lower extremities. Seen in the ER and has taken topical meds w/o relief. Continues to have itching, not spreading. No n/v/f/c. No change in character. No sloughing.  Past Medical History:  Diagnosis Date   Anemia    History of blood transfusion 04/17/2017   "anemic"   Hyperlipidemia    Hypertension     Patient Active Problem List   Diagnosis Date Noted   Ulcer of the duodenum caused by bacteria (H. pylori) 04/19/2017   Gastrointestinal hemorrhage associated with duodenal ulcer    Upper GI bleed    Symptomatic anemia 04/17/2017   Carpal tunnel syndrome 01/25/2017   Gastritis and gastroduodenitis 05/04/2016   Achilles tendinitis of right lower extremity 05/04/2016   Healthcare maintenance 12/29/2015   SOB (shortness of breath) 12/29/2015   Blurry vision, bilateral 11/18/2014   Dyslipidemia 05/24/2014   Essential hypertension, benign 05/24/2014    Past Surgical History:  Procedure Laterality Date   APPENDECTOMY     CHOLECYSTECTOMY OPEN     ESOPHAGOGASTRODUODENOSCOPY N/A 04/18/2017   Procedure: ESOPHAGOGASTRODUODENOSCOPY (EGD);  Surgeon: Mauri Pole, MD;  Location: [facility] ENDOSCOPY;  Service: Endoscopy;  Laterality: N/A;   UPPER GASTROINTESTINAL ENDOSCOPY  04/2017     OB History   No obstetric history on file.     Family History  Problem Relation Age of Onset   Cancer - Other Mother        blood   Colon cancer Neg Hx    Esophageal cancer Neg Hx    Rectal cancer Neg Hx    Stomach cancer Neg Hx     Social History   Tobacco Use   Smoking status: Never   Smokeless tobacco: Never  Vaping Use   Vaping Use: Never used  Substance Use Topics   Alcohol use: No   Drug use:  No    Home Medications Prior to Admission medications   Medication Sig Start Date End Date Taking? Authorizing Provider  diphenhydrAMINE (BENADRYL) 25 mg capsule Take 1 capsule (25 mg total) by mouth every 6 (six) hours as needed for itching. 07/16/21  Yes Bryler Dibble, MD  methylPREDNISolone (MEDROL DOSEPAK) 4 MG TBPK tablet Day 1: 8 mg PO before breakfast, 4 mg after lunch and after dinner, and 8 mg at bedtime  Day 2: 4 mg PO before breakfast, after lunch, and after dinner and 8 mg at bedtime  Day 3: 4 mg PO before breakfast, after lunch, after dinner, and at bedtime  Day 4: 4 mg PO before breakfast, after lunch, and at bedtime  Day 5: 4 mg PO before breakfast and at bedtime  Day 6: 4 mg PO before breakfast 07/17/21  Yes Nitasha Jewel, MD  atorvastatin (LIPITOR) 20 MG tablet Take 1 tablet (20 mg total) by mouth daily. 05/08/21   Charlott Rakes, MD  carvedilol (COREG) 6.25 MG tablet TAKE 1 TABLET (6.25 MG TOTAL) BY MOUTH 2 (TWO) TIMES DAILY WITH A MEAL. 05/08/21 05/08/22  Charlott Rakes, MD  cetirizine (ZYRTEC) 10 MG tablet TAKE 1 TABLET (10 MG TOTAL) BY MOUTH DAILY. 05/08/21 05/08/22  Charlott Rakes, MD  cloNIDine (CATAPRES) 0.1 MG tablet Take 1 tablet (0.1  mg total) by mouth at bedtime. For hotflashes 09/13/20   Charlott Rakes, MD  diclofenac Sodium (VOLTAREN) 1 % GEL Apply 4 g topically 4 (four) times daily. 03/30/21   Charlott Rakes, MD  gabapentin (NEURONTIN) 300 MG capsule TAKE 1 CAPSULE (300 MG TOTAL) BY MOUTH AT BEDTIME. 09/13/20 09/13/21  Charlott Rakes, MD  hydrocortisone 2.5 % lotion Apply topically 2 (two) times daily. Apply to affected areas twice daily. 07/06/21   Luna Fuse, MD  Hyoscyamine Sulfate SL (LEVSIN/SL) 0.125 MG SUBL Place 0.125 mg under the tongue 2 (two) times daily as needed. 04/21/20   Mauri Pole, MD  losartan (COZAAR) 100 MG tablet Take 1 tablet (100 mg total) by mouth daily. 05/08/21   Charlott Rakes, MD  pantoprazole (PROTONIX) 40 MG tablet Take 1  tablet (40 mg total) by mouth daily. 30 minutes before breakfast 07/13/21   Mauri Pole, MD  polyethylene glycol powder (GLYCOLAX/MIRALAX) 17 GM/SCOOP powder Use 1 capful two times daily as needed. 04/21/20   Mauri Pole, MD  spironolactone (ALDACTONE) 25 MG tablet Take 1 tablet (25 mg total) by mouth daily. 05/08/21   Charlott Rakes, MD  traZODone (DESYREL) 50 MG tablet Take 1 tablet (50 mg total) by mouth at bedtime. 05/08/21 05/08/22  Charlott Rakes, MD  zolpidem (AMBIEN) 5 MG tablet Take 1 tablet (5 mg total) by mouth at bedtime as needed for sleep. 07/03/19   Veryl Speak, MD    Allergies    Amlodipine  Review of Systems   Review of Systems  Constitutional:  Positive for activity change. Negative for fever.  Skin:  Positive for rash.  Allergic/Immunologic: Positive for immunocompromised state. Negative for environmental allergies and food allergies.  Hematological:  Does not bruise/bleed easily.   Physical Exam Updated Vital Signs BP (!) 169/98 (BP Location: Right Arm)   Pulse (!) 56   Temp 99 F (37.2 C) (Oral)   Resp 16   LMP 11/15/2014   SpO2 100%   Physical Exam Vitals and nursing note reviewed.  Constitutional:      Appearance: She is well-developed.  HENT:     Head: Atraumatic.     Mouth/Throat:     Comments: No oral involvement of rash Cardiovascular:     Rate and Rhythm: Normal rate.  Pulmonary:     Effort: Pulmonary effort is normal.  Musculoskeletal:        General: No swelling or tenderness.     Cervical back: Normal range of motion and neck supple.  Skin:    General: Skin is warm and dry.     Findings: Erythema and rash present.     Comments: Bilateral upper and lower extremities with areas of confluence of macular rash and other areas with erythematous patch -most pronounced over the extensor surface of the upper extremity.  The rash is also present on the extensor aspect of the lower extremity.  No pustules, vesicles, blisters, crusting.  Mild excoriation  Neurological:     Mental Status: She is alert and oriented to person, place, and time.    ED Results / Procedures / Treatments   Labs (all labs ordered are listed, but only abnormal results are displayed) Labs Reviewed - No data to display  EKG None  Radiology No results found.  Procedures Procedures   Medications Ordered in ED Medications  predniSONE (DELTASONE) tablet 60 mg (has no administration in time range)  diphenhydrAMINE (BENADRYL) capsule 50 mg (has no administration in time range)  ED Course  I have reviewed the triage vital signs and the nursing notes.  Pertinent labs & imaging results that were available during my care of the patient were reviewed by me and considered in my medical decision making (see chart for details).    MDM Rules/Calculators/A&P                           50 year old female comes in with chief complaint of persistent rash.  She has had this rash for a month, it is unchanged despite topical hydrocortisone.  She denies any arthralgias, night sweats, unexplained weight loss.  No history of any autoimmune condition or dermatologic problems.  On exam, the rash is more pronounced over the extensor surfaces of both upper and lower extremities, there is no plaques or scaling at this time.  It is possible that she could be having systemic condition that is manifesting with this rash  We will up her treatment to oral steroids and as needed Benadryl, but have really strongly suggested outpatient PCP follow-up for optimal diagnosis and management.  Patient expresses understanding the recommendation.  Daughter at the bedside helped with history.  Final Clinical Impression(s) / ED Diagnoses Final diagnoses:  Rash and nonspecific skin eruption    Rx / DC Orders ED Discharge Orders          Ordered    methylPREDNISolone (MEDROL DOSEPAK) 4 MG TBPK tablet        07/16/21 2052    diphenhydrAMINE (BENADRYL) 25 mg capsule  Every 6  hours PRN        07/16/21 2052             Varney Biles, MD 07/16/21 2056

## 2021-07-16 NOTE — Discharge Instructions (Addendum)
You are seen in the ER for rash. It is unclear why you are having the rash.  As discussed, there could be important medical conditions that could explain the rash, and it is important that we get the right diagnosis.  We are starting you with some oral prednisone, but it is important that you follow-up with your primary care doctor for proper diagnosis and treatment.

## 2021-07-16 NOTE — ED Notes (Signed)
Used translator to review discharge instructions including indepth instructions on medications.  No further questions at this time.

## 2021-07-16 NOTE — ED Triage Notes (Signed)
Pt reports generalized rash all over x 1 month.  States she was seen here 1 week ago and given a Rx but hasn't improved.

## 2021-07-16 NOTE — ED Provider Notes (Signed)
Emergency Medicine Provider Triage Evaluation Note  Alice Woodard , a 50 y.o. female  was evaluated in triage.  Pt complains of rash to bilateral upper and lower extremities.  Rash has been present x1 month.  Patient was seen on 8/25 and prescribed hydrocortisone cream.  Reports that hydrocortisone cream is not improving symptoms.  Patient denies any new exposure to personal cleaning products, detergents, or exposure to chemicals.  Review of Systems  Positive: Rash Negative: Shortness of breath, difficulty breathing  Physical Exam  BP (!) 169/98 (BP Location: Right Arm)   Pulse (!) 56   Temp 99 F (37.2 C) (Oral)   Resp 16   LMP 11/15/2014   SpO2 100%  Gen:   Awake, no distress   Resp:  Normal effort  MSK:   Moves extremities without difficulty  Other:  Patient has macular rash to bilateral upper and lower extremities.  No rash to abdomen or back.  Medical Decision Making  Medically screening exam initiated at 5:18 PM.  Appropriate orders placed.  Alice Woodard was informed that the remainder of the evaluation will be completed by another provider, this initial triage assessment does not replace that evaluation, and the importance of remaining in the ED until their evaluation is complete.  The patient appears stable so that the remainder of the work up may be completed by another provider.      Loni Beckwith, PA-C 07/16/21 Cindy Hazy, MD 07/20/21 [phone]

## 2021-07-18 ENCOUNTER — Other Ambulatory Visit: Payer: Self-pay

## 2021-07-18 ENCOUNTER — Telehealth: Payer: Self-pay | Admitting: Family Medicine

## 2021-07-18 NOTE — Telephone Encounter (Signed)
Pt is needing referral to dermatology for rash. Patient has been seen in the ED for rash.

## 2021-07-18 NOTE — Telephone Encounter (Signed)
Attempted to reach the office - with no answer. Pt is calling to receive an appt for rashes on her arms and buttocks. Pt was seen in the ED for rashes and was advised to follow up with PCP. Pt was offered an appt in October. Pt is stating that this is an emergency. And is requesting a sooner appt to receive a dermatology refferal. Please advise

## 2021-07-19 NOTE — Telephone Encounter (Signed)
She will need an office visit with any available clinician to evaluate this rash so there can be office notes to accompany Dermatology referral.  We have had repeated instances of Dermatology declining referral due to no office notes to justify.

## 2021-07-19 NOTE — Telephone Encounter (Signed)
Pt has been set a virtual appointment with [facility] 07/20/2021

## 2021-07-20 ENCOUNTER — Other Ambulatory Visit: Payer: Self-pay

## 2021-07-20 ENCOUNTER — Encounter: Payer: Self-pay | Admitting: Nurse Practitioner

## 2021-07-20 ENCOUNTER — Telehealth (INDEPENDENT_AMBULATORY_CARE_PROVIDER_SITE_OTHER): Payer: Self-pay | Admitting: Nurse Practitioner

## 2021-07-20 DIAGNOSIS — R21 Rash and other nonspecific skin eruption: Secondary | ICD-10-CM

## 2021-07-20 NOTE — Progress Notes (Signed)
Virtual Visit via Telephone Note  I connected with Alice Woodard on 07/20/21 at  2:30 PM EDT by telephone and verified that I am speaking with the correct person using two identifiers.  Location: Patient: home Provider: office   I discussed the limitations, risks, security and privacy concerns of performing an evaluation and management service by telephone and the availability of in person appointments. I also discussed with the patient that there may be a patient responsible charge related to this service. The patient expressed understanding and agreed to proceed.   History of Present Illness:  Patient presents today for an acute visit through televisit.  Patient has been seen in the emergency room twice for rash.  She states that the emergency room doctors discussed that she should be seen by her primary care physician and have a referral placed to dermatology.  Patient has been prescribed topical hydrocortisone and steroids.  She is also advised to take Benadryl.  She states that this has not helped.  We discussed that we will place referral to dermatology for further evaluation.  It is hard to assess a skin rash over the phone.  Per last ED note 07/16/21: Bilateral upper and lower extremities with areas of confluence of macular rash and other areas with erythematous patch -most pronounced over the extensor surface of the upper extremity.  The rash is also present on the extensor aspect of the lower extremity.      Observations/Objective:  Vitals with BMI 07/16/2021 07/16/2021 07/16/2021  Height - - -  Weight - - -  BMI - - -  Systolic [id number]  Diastolic 97 97 98  Pulse [id number] 51 56     Assessment and Plan:  Chronic Rash:  Will place referral to dermatology  Follow up:  Follow up with PCP as scheduled       I discussed the assessment and treatment plan with the patient. The patient was provided an opportunity to ask questions and all were answered. The patient agreed with  the plan and demonstrated an understanding of the instructions.   The patient was advised to call back or seek an in-person evaluation if the symptoms worsen or if the condition fails to improve as anticipated.  I provided 23 minutes of non-face-to-face time during this encounter.   Fenton Foy, NP

## 2021-07-20 NOTE — Patient Instructions (Signed)
Chronic Rash:  Will place referral to dermatology  Follow up:  Follow up with PCP as scheduled

## 2021-07-27 ENCOUNTER — Encounter: Payer: Self-pay | Admitting: Gastroenterology

## 2021-07-27 ENCOUNTER — Ambulatory Visit (AMBULATORY_SURGERY_CENTER): Payer: Self-pay | Admitting: Gastroenterology

## 2021-07-27 ENCOUNTER — Other Ambulatory Visit: Payer: Self-pay

## 2021-07-27 VITALS — BP 131/86 | HR 53 | Temp 97.5°F | Resp 12 | Ht <= 58 in | Wt 125.0 lb

## 2021-07-27 DIAGNOSIS — R1013 Epigastric pain: Secondary | ICD-10-CM

## 2021-07-27 DIAGNOSIS — K259 Gastric ulcer, unspecified as acute or chronic, without hemorrhage or perforation: Secondary | ICD-10-CM

## 2021-07-27 DIAGNOSIS — K2980 Duodenitis without bleeding: Secondary | ICD-10-CM

## 2021-07-27 DIAGNOSIS — K31A0 Gastric intestinal metaplasia, unspecified: Secondary | ICD-10-CM

## 2021-07-27 DIAGNOSIS — K31A19 Gastric intestinal metaplasia without dysplasia, unspecified site: Secondary | ICD-10-CM

## 2021-07-27 MED ORDER — SODIUM CHLORIDE 0.9 % IV SOLN: 500.0000 mL | Freq: Once | INTRAVENOUS | Status: DC

## 2021-07-27 NOTE — Progress Notes (Signed)
Pt speaks Nepali.  Interpreter, [place].  Pt's daughter, Lalla Brothers also speaks [place] and also speaks english.  No problems noted in the recovery room. maw

## 2021-07-27 NOTE — Progress Notes (Signed)
Pt is very poor historian.  She has a large number of medications listed but states that she is "only taking 1".

## 2021-07-27 NOTE — Op Note (Signed)
[place] Patient Name: Alice Woodard Procedure Date: 07/27/2021 2:42 PM MRN: [id number] Endoscopist: Mauri Pole , MD Age: 50 Referring MD:  Date of Birth: [birth year] Gender: Female Account #: [id number] Procedure:                Upper GI endoscopy Indications:              Epigastric abdominal pain, Abdominal pain in the                            right upper quadrant Medicines:                Monitored Anesthesia Care Procedure:                Pre-Anesthesia Assessment:                           - Prior to the procedure, a History and Physical                            was performed, and patient medications and                            allergies were reviewed. The patient's tolerance of                            previous anesthesia was also reviewed. The risks                            and benefits of the procedure and the sedation                            options and risks were discussed with the patient.                            All questions were answered, and informed consent                            was obtained. Prior Anticoagulants: The patient has                            taken no previous anticoagulant or antiplatelet                            agents. ASA Grade Assessment: II - A patient with                            mild systemic disease. After reviewing the risks                            and benefits, the patient was deemed in                            satisfactory condition to undergo the procedure.  After obtaining informed consent, the endoscope was                            passed under direct vision. Throughout the                            procedure, the patient's blood pressure, pulse, and                            oxygen saturations were monitored continuously. The                            GIF [id number] was introduced through the                            mouth, and advanced to the second  part of duodenum.                            The upper GI endoscopy was accomplished without                            difficulty. The patient tolerated the procedure                            well. Scope In: Scope Out: Findings:                 The Z-line was regular and was found 35 cm from the                            incisors.                           No gross lesions were noted in the entire esophagus.                           Striped mildly erythematous mucosa without bleeding                            was found in the gastric antrum and in the                            prepyloric region of the stomach. Biopsies were                            taken with a cold forceps for Helicobacter pylori                            testing.                           Few non-bleeding linear gastric ulcers with no                            stigmata of bleeding were found in the  gastric                            body. The largest lesion was 6 mm in largest                            dimension. Biopsies were taken with a cold forceps                            for histology.                           A few erosions without bleeding were found in the                            duodenal bulb, in the first portion of the duodenum                            and in the second portion of the duodenum. Biopsies                            were taken with a cold forceps for histology. Complications:            No immediate complications. Estimated Blood Loss:     Estimated blood loss was minimal. Impression:               - Z-line regular, 35 cm from the incisors.                           - No gross lesions in esophagus.                           - Erythematous mucosa in the antrum and prepyloric                            region of the stomach. Biopsied.                           - Non-bleeding gastric ulcers with no stigmata of                            bleeding. Biopsied.                            - Duodenal erosions without bleeding. Biopsied. Recommendation:           - Resume previous diet.                           - Continue present medications.                           - Await pathology results.                           - No aspirin, ibuprofen, naproxen, or other  non-steroidal anti-inflammatory drugs.                           - Follow an antireflux regimen. Mauri Pole, MD 07/27/2021 3:10:44 PM This report has been signed electronically.

## 2021-07-27 NOTE — Progress Notes (Signed)
Called to room to assist during endoscopic procedure.  Patient ID and intended procedure confirmed with present staff. Received instructions for my participation in the procedure from the performing physician.  

## 2021-07-27 NOTE — Patient Instructions (Addendum)
Handout was given to your care partner on Gastritis and GERD. NO ASPIRIN, ASPIRIN CONTAINING PRODUCTS (BC OR GOODY POWDERS) OR NSAIDS (IBUPROFEN, ADVIL, ALEVE, AND MOTRIN); TYLENOL IS OK TO TAKE if needed.  You may use topical VOLTARAN if needed also. You may resume your current medications today. Await biopsy results.  May take 1-3 weeks to receive pathology results. Please call if any questions or concerns.      YOU HAD AN ENDOSCOPIC PROCEDURE TODAY AT [facility]:   Refer to the procedure report that was given to you for any specific questions about what was found during the examination.  If the procedure report does not answer your questions, please call your gastroenterologist to clarify.  If you requested that your care partner not be given the details of your procedure findings, then the procedure report has been included in a sealed envelope for you to review at your convenience later.  YOU SHOULD EXPECT: Some feelings of bloating in the abdomen. Passage of more gas than usual.  Walking can help get rid of the air that was put into your GI tract during the procedure and reduce the bloating. If you had a lower endoscopy (such as a colonoscopy or flexible sigmoidoscopy) you may notice spotting of blood in your stool or on the toilet paper. If you underwent a bowel prep for your procedure, you may not have a normal bowel movement for a few days.  Please Note:  You might notice some irritation and congestion in your nose or some drainage.  This is from the oxygen used during your procedure.  There is no need for concern and it should clear up in a day or so.  SYMPTOMS TO REPORT IMMEDIATELY:   Following upper endoscopy (EGD)  Vomiting of blood or coffee ground material  New chest pain or pain under the shoulder blades  Painful or persistently difficult swallowing  New shortness of breath  Fever of 100F or higher  Black, tarry-looking stools  For urgent or emergent  issues, a gastroenterologist can be reached at any hour by calling [phone]. Do not use MyChart messaging for urgent concerns.    DIET:  We do recommend a small meal at first, but then you may proceed to your regular diet.  Drink plenty of fluids but you should avoid alcoholic beverages for 24 hours.  ACTIVITY:  You should plan to take it easy for the rest of today and you should NOT DRIVE or use heavy machinery until tomorrow (because of the sedation medicines used during the test).    FOLLOW UP: Our staff will call the number listed on your records 48-72 hours following your procedure to check on you and address any questions or concerns that you may have regarding the information given to you following your procedure. If we do not reach you, we will leave a message.  We will attempt to reach you two times.  During this call, we will ask if you have developed any symptoms of COVID 19. If you develop any symptoms (ie: fever, flu-like symptoms, shortness of breath, cough etc.) before then, please call [phone].  If you test positive for Covid 19 in the 2 weeks post procedure, please call and report this information to [place].    If any biopsies were taken you will be contacted by phone or by letter within the next 1-3 weeks.  Please call us at [phone] if you have not heard about the biopsies in 3 weeks.  SIGNATURES/CONFIDENTIALITY: You and/or your care partner have signed paperwork which will be entered into your electronic medical record.  These signatures attest to the fact that that the information above on your After Visit Summary has been reviewed and is understood.  Full responsibility of the confidentiality of this discharge information lies with you and/or your care-partner.

## 2021-07-27 NOTE — Progress Notes (Signed)
Please refer to office visit note 07/13/21. No additional changes in H&P Patient is appropriate for planned procedure(s) and anesthesia in an ambulatory setting  K. Veena Janeen Watson , MD [phone]   

## 2021-07-27 NOTE — Progress Notes (Signed)
Sedate, gd SR, tolerated procedure well, VSS, report to RN 

## 2021-07-31 ENCOUNTER — Telehealth: Payer: Self-pay | Admitting: *Deleted

## 2021-07-31 ENCOUNTER — Telehealth: Payer: Self-pay

## 2021-07-31 NOTE — Telephone Encounter (Signed)
Attempted f/u phone call. No answer. No voicemail set up, unable to leave message.

## 2021-07-31 NOTE — Telephone Encounter (Signed)
  Follow up Call-  Call back number 07/27/2021 05/20/2019  Post procedure Call Back phone  # [phone] daughter Lalla Brothers [phone] Hema-daughter speaks [place] , pt speaks Nepali  Permission to leave phone message Yes Yes  Some recent data might be hidden     Patient questions:  Do you have a fever, pain , or abdominal swelling? No. Pain Score  0 *  Have you tolerated food without any problems? Yes.    Have you been able to return to your normal activities? Yes.    Do you have any questions about your discharge instructions: Diet   No. Medications  No. Follow up visit  No.  Do you have questions or concerns about your Care? No.  Actions: * If pain score is 4 or above: No action needed, pain <4.

## 2021-08-03 ENCOUNTER — Encounter: Payer: Self-pay | Admitting: Gastroenterology

## 2021-08-10 ENCOUNTER — Encounter (HOSPITAL_COMMUNITY): Payer: Self-pay | Admitting: Emergency Medicine

## 2021-08-10 ENCOUNTER — Emergency Department (HOSPITAL_COMMUNITY)
Admission: EM | Admit: 2021-08-10 | Discharge: 2021-08-10 | Disposition: A | Discharge status: Home / Self Care | Payer: Medicaid Other | Attending: Emergency Medicine | Admitting: Emergency Medicine

## 2021-08-10 DIAGNOSIS — L509 Urticaria, unspecified: Secondary | ICD-10-CM | POA: Insufficient documentation

## 2021-08-10 DIAGNOSIS — R21 Rash and other nonspecific skin eruption: Secondary | ICD-10-CM

## 2021-08-10 DIAGNOSIS — Z79899 Other long term (current) drug therapy: Secondary | ICD-10-CM | POA: Insufficient documentation

## 2021-08-10 DIAGNOSIS — I10 Essential (primary) hypertension: Secondary | ICD-10-CM | POA: Insufficient documentation

## 2021-08-10 MED ORDER — METHYLPREDNISOLONE 4 MG PO TBPK
ORAL_TABLET | ORAL | 0 refills | Status: DC
Start: 2021-08-10 — End: 2022-04-05
  Filled 2021-08-10 – 2021-08-11 (×2): qty 21, 6d supply, fill #0

## 2021-08-10 MED ORDER — DIPHENHYDRAMINE HCL 25 MG PO CAPS
25.0000 mg | ORAL_CAPSULE | Freq: Once | ORAL | Status: AC
  Administered 2021-08-10: 25 mg via ORAL
  Filled 2021-08-10: qty 1

## 2021-08-10 MED ORDER — PREDNISONE 20 MG PO TABS
40.0000 mg | ORAL_TABLET | Freq: Once | ORAL | Status: AC
  Administered 2021-08-10: 40 mg via ORAL
  Filled 2021-08-10: qty 2

## 2021-08-10 NOTE — Discharge Instructions (Signed)
Follow-up with dermatology as planned.  The steroids may help with the rash.  Return for more difficulty breathing or swelling in your throat or mouth

## 2021-08-10 NOTE — ED Provider Notes (Signed)
[facility] EMERGENCY DEPARTMENT Provider Note   CSN: [id number] Arrival date & time: 08/10/21  1502     History Chief Complaint  Patient presents with   Rash    Alice Woodard is a 50 y.o. female.   Rash Associated symptoms: no abdominal pain, no shortness of breath, no sore throat and not wheezing   Patient speaks Nepali primarily.  Translated by family member.  Electronic technology offered for translation but family and patient deferred. Patient presents with rash.  Began couple days ago.  Has had few episodes of this over the last few months.  Has had some mild itchiness before that usually never this bad.  Has been on a Medrol Dosepak previously which helped for couple months but then it returned.  Has been taking Benadryl at home but does not help.  Rash is on the extremities chest and back.  Also on face and throat some.  Not having difficulty breathing or swallowing.  No wheezing.    Past Medical History:  Diagnosis Date   Anemia    History of blood transfusion 04/17/2017   "anemic"   Hyperlipidemia    Hypertension     Patient Active Problem List   Diagnosis Date Noted   Ulcer of the duodenum caused by bacteria (H. pylori) 04/19/2017   Gastrointestinal hemorrhage associated with duodenal ulcer    Upper GI bleed    Symptomatic anemia 04/17/2017   Carpal tunnel syndrome 01/25/2017   Gastritis and gastroduodenitis 05/04/2016   Achilles tendinitis of right lower extremity 05/04/2016   Healthcare maintenance 12/29/2015   SOB (shortness of breath) 12/29/2015   Blurry vision, bilateral 11/18/2014   Dyslipidemia 05/24/2014   Essential hypertension, benign 05/24/2014    Past Surgical History:  Procedure Laterality Date   APPENDECTOMY     CHOLECYSTECTOMY OPEN     ESOPHAGOGASTRODUODENOSCOPY N/A 04/18/2017   Procedure: ESOPHAGOGASTRODUODENOSCOPY (EGD);  Surgeon: Mauri Pole, MD;  Location: [facility] END[facility]COPY;  Service: Endoscopy;  Laterality: N/A;    UPPER GASTROINTESTINAL ENDOSCOPY  04/2017     OB History   No obstetric history on file.     Family History  Problem Relation Age of Onset   Cancer - Other Mother        blood   Colon cancer Neg Hx    Esophageal cancer Neg Hx    Rectal cancer Neg Hx    Stomach cancer Neg Hx     Social History   Tobacco Use   Smoking status: Never   Smokeless tobacco: Never  Vaping Use   Vaping Use: Never used  Substance Use Topics   Alcohol use: No   Drug use: No    Home Medications Prior to Admission medications   Medication Sig Start Date End Date Taking? Authorizing Provider  atorvastatin (LIPITOR) 20 MG tablet Take 1 tablet (20 mg total) by mouth daily. Patient not taking: Reported on 07/27/2021 05/08/21   Charlott Rakes, MD  carvedilol (COREG) 6.25 MG tablet TAKE 1 TABLET (6.25 MG TOTAL) BY MOUTH 2 (TWO) TIMES DAILY WITH A MEAL. 05/08/21 05/08/22  Charlott Rakes, MD  cetirizine (ZYRTEC) 10 MG tablet TAKE 1 TABLET (10 MG TOTAL) BY MOUTH DAILY. 05/08/21 05/08/22  Charlott Rakes, MD  cloNIDine (CATAPRES) 0.1 MG tablet Take 1 tablet (0.1 mg total) by mouth at bedtime. For hotflashes 09/13/20   Charlott Rakes, MD  diclofenac Sodium (VOLTAREN) 1 % GEL Apply 4 g topically 4 (four) times daily. Patient not taking: Reported on 07/27/2021 03/30/21  Charlott Rakes, MD  diphenhydrAMINE (BENADRYL) 25 mg capsule Take 1 capsule (25 mg total) by mouth every 6 (six) hours as needed for itching. 07/16/21   Varney Biles, MD  gabapentin (NEURONTIN) 300 MG capsule TAKE 1 CAPSULE (300 MG TOTAL) BY MOUTH AT BEDTIME. 09/13/20 09/13/21  Charlott Rakes, MD  hydrocortisone 2.5 % lotion Apply topically 2 (two) times daily. Apply to affected areas twice daily. 07/06/21   Luna Fuse, MD  Hyoscyamine Sulfate SL (LEVSIN/SL) 0.125 MG SUBL Place 0.125 mg under the tongue 2 (two) times daily as needed. Patient not taking: Reported on 07/27/2021 04/21/20   Mauri Pole, MD  losartan (COZAAR) 100 MG tablet Take  1 tablet (100 mg total) by mouth daily. Patient not taking: Reported on 07/27/2021 05/08/21   Charlott Rakes, MD  methylPREDNISolone (MEDROL DOSEPAK) 4 MG TBPK tablet Day 1: 8 mg by mouth before breakfast, 4 mg after lunch and after dinner, and 8 mg at bedtime Day 2: 4 mg by mouth before breakfast, after lunch, and after dinner and 8 mg at bedtime Day 3: 4 mg by mouth before breakfast, after lunch, after dinner, and at bedtime Day 4: 4 mg by mouth before breakfast, after lunch, and at bedtime Day 5: 4 mg by mouth before breakfast and at bedtime Day 6: 4 mg by mouth before breakfast 08/10/21   Davonna Belling, MD  pantoprazole (PROTONIX) 40 MG tablet Take 1 tablet (40 mg total) by mouth daily. 30 minutes before breakfast Patient not taking: Reported on 07/27/2021 07/13/21   Mauri Pole, MD  polyethylene glycol powder (GLYCOLAX/MIRALAX) 17 GM/SCOOP powder Use 1 capful two times daily as needed. 04/21/20   Mauri Pole, MD  spironolactone (ALDACTONE) 25 MG tablet Take 1 tablet (25 mg total) by mouth daily. Patient not taking: Reported on 07/27/2021 05/08/21   Charlott Rakes, MD  traZODone (DESYREL) 50 MG tablet Take 1 tablet (50 mg total) by mouth at bedtime. Patient not taking: Reported on 07/27/2021 05/08/21 05/08/22  Charlott Rakes, MD  zolpidem (AMBIEN) 5 MG tablet Take 1 tablet (5 mg total) by mouth at bedtime as needed for sleep. Patient not taking: Reported on 07/27/2021 07/03/19   Veryl Speak, MD    Allergies    Amlodipine  Review of Systems   Review of Systems  Constitutional:  Negative for appetite change.  HENT:  Negative for congestion, facial swelling, sore throat and trouble swallowing.   Respiratory:  Negative for shortness of breath and wheezing.   Cardiovascular:  Negative for chest pain.  Gastrointestinal:  Negative for abdominal pain.  Genitourinary:  Negative for flank pain.  Skin:  Positive for rash.  Neurological:  Negative for weakness.   Psychiatric/Behavioral:  Negative for confusion.    Physical Exam Updated Vital Signs BP (!) 157/93   Pulse 64   Temp 98.7 F (37.1 C) (Oral)   Resp 17   LMP 11/15/2014   SpO2 100%   Physical Exam Vitals and nursing note reviewed.  HENT:     Head: Atraumatic.     Mouth/Throat:     Pharynx: No oropharyngeal exudate or posterior oropharyngeal erythema.  Eyes:     Pupils: Pupils are equal, round, and reactive to light.  Cardiovascular:     Rate and Rhythm: Regular rhythm.  Abdominal:     Tenderness: There is no abdominal tenderness.  Musculoskeletal:        General: No tenderness.     Cervical back: Neck supple.     Right  lower leg: No edema.     Left lower leg: No edema.  Skin:    General: Skin is warm.     Comments: Patient with multiple areas of hives.  Some more solo and some more confluent.  On chest back neck face and extremities.  No mucous membrane involvement.  Neurological:     Mental Status: She is alert. Mental status is at baseline.    ED Results / Procedures / Treatments   Labs (all labs ordered are listed, but only abnormal results are displayed) Labs Reviewed - No data to display  EKG None  Radiology No results found.  Procedures Procedures   Medications Ordered in ED Medications  diphenhydrAMINE (BENADRYL) capsule 25 mg (25 mg Oral Given 08/10/21 1604)  predniSONE (DELTASONE) tablet 40 mg (40 mg Oral Given 08/10/21 2025)    ED Course  I have reviewed the triage vital signs and the nursing notes.  Pertinent labs & imaging results that were available during my care of the patient were reviewed by me and considered in my medical decision making (see chart for details).    MDM Rules/Calculators/A&P                           Patient with rash.  Peers to be hives.  No mucous membrane involvement.  No clear cause.  Has had 3 different episodes of this.  Has improved with Medrol Dosepak, past.  Also has Benadryl at home.  Will give dose of  steroids here and refill Dosepak.  Has dermatology follow-up in about a week. Final Clinical Impression(s) / ED Diagnoses Final diagnoses:  Rash    Rx / DC Orders ED Discharge Orders          Ordered    methylPREDNISolone (MEDROL DOSEPAK) 4 MG TBPK tablet        08/10/21 2035             Davonna Belling, MD 08/10/21 2223

## 2021-08-10 NOTE — ED Provider Notes (Signed)
Emergency Medicine Provider Triage Evaluation Note  Alice Woodard , a 50 y.o. female  was evaluated in triage.  Pt complains of rash over the last 3 months.  She was seen and evaluated in the emergency department a couple of times for this and was given steroids and Benadryl.  That initially helped however the rash came back couple days ago prompting her arrival today.  She reports a diffuse pruritic rash.  She denies any cough, congestion, fever, chills, new soaps/detergents, new medications, or any contact with suspicious plants outside.  Denies any trouble breathing, trouble swallowing, trouble talking.  Review of Systems  Positive:  Negative: See above   Physical Exam  BP (!) 185/105 (BP Location: Left Arm)   Pulse 73   Temp 98.7 F (37.1 C) (Oral)   Resp 16   LMP 11/15/2014   SpO2 100%  Gen:   Awake, no distress   Resp:  Normal effort, clear to auscultation bilaterally MSK:   Moves extremities without difficulty  Other:  No pharyngeal erythema or obvious edema.  Medical Decision Making  Medically screening exam initiated at 3:59 PM.  Appropriate orders placed.  Paeton Latouche was informed that the remainder of the evaluation will be completed by another provider, this initial triage assessment does not replace that evaluation, and the importance of remaining in the ED until their evaluation is complete.     Myna Bright Cache, PA-C 08/10/21 1601    Gareth Morgan, MD 08/12/21 0800

## 2021-08-10 NOTE — ED Triage Notes (Signed)
Patient complains of itching rash to bilateral arms and legs that appeared yesterday. Patient states she had a different type of rash approximately two months ago and was prescribed a medication that helped then, but states she took that medication again and it is not helping. Patient alert, oriented, speaking in complete sentences, no dyspnea or tachypnea noted.

## 2021-08-10 NOTE — ED Notes (Signed)
Pt a&ox 4 ambulatory at d/c with independent steady gait

## 2021-08-11 ENCOUNTER — Other Ambulatory Visit: Payer: Self-pay

## 2021-09-23 ENCOUNTER — Encounter (HOSPITAL_COMMUNITY): Payer: Self-pay | Admitting: Emergency Medicine

## 2021-09-23 ENCOUNTER — Other Ambulatory Visit: Payer: Self-pay

## 2021-09-23 ENCOUNTER — Ambulatory Visit (HOSPITAL_COMMUNITY)
Admission: EM | Admit: 2021-09-23 | Discharge: 2021-09-23 | Disposition: A | Discharge status: Home / Self Care | Payer: Medicaid Other | Attending: Medical Oncology | Admitting: Medical Oncology

## 2021-09-23 DIAGNOSIS — R195 Other fecal abnormalities: Secondary | ICD-10-CM | POA: Insufficient documentation

## 2021-09-23 DIAGNOSIS — Z20822 Contact with and (suspected) exposure to covid-19: Secondary | ICD-10-CM | POA: Insufficient documentation

## 2021-09-23 DIAGNOSIS — J069 Acute upper respiratory infection, unspecified: Secondary | ICD-10-CM | POA: Insufficient documentation

## 2021-09-23 NOTE — ED Triage Notes (Signed)
Pt c/o cough, neck feels hot, abd pains intermittent and diarrhea 3 times.

## 2021-09-23 NOTE — ED Provider Notes (Signed)
[facility]    CSN: [id number] Arrival date & time: 09/23/21  1526      History   Chief Complaint Chief Complaint  Patient presents with   Cough   Abdominal Pain   Diarrhea    HPI Alice Woodard is a 50 y.o. female. Pt presents with her daughter who she wishes to have interpret for her.   HPI   Cough: Pt reports that since today she has had dry cough, sore throat, subjective fever, abdominal pains and 3 episodes of loose brown stools. She has not tried anything for symptoms. No known sick contacts. No SOB, vomiting, wheezing, dysuria.    Past Medical History:  Diagnosis Date   Anemia    History of blood transfusion 04/17/2017   "anemic"   Hyperlipidemia    Hypertension     Patient Active Problem List   Diagnosis Date Noted   Ulcer of the duodenum caused by bacteria (H. pylori) 04/19/2017   Gastrointestinal hemorrhage associated with duodenal ulcer    Upper GI bleed    Symptomatic anemia 04/17/2017   Carpal tunnel syndrome 01/25/2017   Gastritis and gastroduodenitis 05/04/2016   Achilles tendinitis of right lower extremity 05/04/2016   Healthcare maintenance 12/29/2015   SOB (shortness of breath) 12/29/2015   Blurry vision, bilateral 11/18/2014   Dyslipidemia 05/24/2014   Essential hypertension, benign 05/24/2014    Past Surgical History:  Procedure Laterality Date   APPENDECTOMY     CHOLECYSTECTOMY OPEN     ESOPHAGOGASTRODUODENOSCOPY N/A 04/18/2017   Procedure: ESOPHAGOGASTRODUODENOSCOPY (EGD);  Surgeon: Mauri Pole, MD;  Location: [facility] ENDOSCOPY;  Service: Endoscopy;  Laterality: N/A;   UPPER GASTROINTESTINAL ENDOSCOPY  04/2017    OB History   No obstetric history on file.      Home Medications    Prior to Admission medications   Medication Sig Start Date End Date Taking? Authorizing Provider  carvedilol (COREG) 6.25 MG tablet TAKE 1 TABLET (6.25 MG TOTAL) BY MOUTH 2 (TWO) TIMES DAILY WITH A MEAL. 05/08/21 05/08/22 Yes Newlin,  Charlane Ferretti, MD  pantoprazole (PROTONIX) 40 MG tablet Take 1 tablet (40 mg total) by mouth daily. 30 minutes before breakfast 07/13/21  Yes Nandigam, Venia Minks, MD  atorvastatin (LIPITOR) 20 MG tablet Take 1 tablet (20 mg total) by mouth daily. Patient not taking: No sig reported 05/08/21   Charlott Rakes, MD  cetirizine (ZYRTEC) 10 MG tablet TAKE 1 TABLET (10 MG TOTAL) BY MOUTH DAILY. 05/08/21 05/08/22  Charlott Rakes, MD  cloNIDine (CATAPRES) 0.1 MG tablet Take 1 tablet (0.1 mg total) by mouth at bedtime. For hotflashes 09/13/20   Charlott Rakes, MD  diclofenac Sodium (VOLTAREN) 1 % GEL Apply 4 g topically 4 (four) times daily. Patient not taking: No sig reported 03/30/21   Charlott Rakes, MD  diphenhydrAMINE (BENADRYL) 25 mg capsule Take 1 capsule (25 mg total) by mouth every 6 (six) hours as needed for itching. 07/16/21   Varney Biles, MD  gabapentin (NEURONTIN) 300 MG capsule TAKE 1 CAPSULE (300 MG TOTAL) BY MOUTH AT BEDTIME. 09/13/20 09/13/21  Charlott Rakes, MD  hydrocortisone 2.5 % lotion Apply topically 2 (two) times daily. Apply to affected areas twice daily. 07/06/21   Luna Fuse, MD  Hyoscyamine Sulfate SL (LEVSIN/SL) 0.125 MG SUBL Place 0.125 mg under the tongue 2 (two) times daily as needed. Patient not taking: No sig reported 04/21/20   Mauri Pole, MD  losartan (COZAAR) 100 MG tablet Take 1 tablet (100 mg total) by mouth daily.  Patient not taking: No sig reported 05/08/21   Charlott Rakes, MD  methylPREDNISolone (MEDROL DOSEPAK) 4 MG TBPK tablet Day 1: 8 mg by mouth before breakfast, 4 mg after lunch and after dinner, and 8 mg at bedtime   Day 2: 4 mg by mouth before breakfast, after lunch, and after dinner and 8 mg at bedtime   Day 3: 4 mg by mouth before breakfast, after lunch, after dinner, and at bedtime   Day 4: 4 mg by mouth before breakfast, after lunch, and at bedtime   Day 5: 4 mg by mouth before breakfast and at bedtime   Day 6: 4 mg by mouth before breakfast 08/10/21    Davonna Belling, MD  polyethylene glycol powder (GLYCOLAX/MIRALAX) 17 GM/SCOOP powder Use 1 capful two times daily as needed. 04/21/20   Mauri Pole, MD  spironolactone (ALDACTONE) 25 MG tablet Take 1 tablet (25 mg total) by mouth daily. Patient not taking: No sig reported 05/08/21   Charlott Rakes, MD  traZODone (DESYREL) 50 MG tablet Take 1 tablet (50 mg total) by mouth at bedtime. Patient not taking: No sig reported 05/08/21 05/08/22  Charlott Rakes, MD  zolpidem (AMBIEN) 5 MG tablet Take 1 tablet (5 mg total) by mouth at bedtime as needed for sleep. Patient not taking: No sig reported 07/03/19   Veryl Speak, MD    Family History Family History  Problem Relation Age of Onset   Cancer - Other Mother        blood   Colon cancer Neg Hx    Esophageal cancer Neg Hx    Rectal cancer Neg Hx    Stomach cancer Neg Hx     Social History Social History   Tobacco Use   Smoking status: Never   Smokeless tobacco: Never  Vaping Use   Vaping Use: Never used  Substance Use Topics   Alcohol use: No   Drug use: No     Allergies   Amlodipine   Review of Systems Review of Systems  As stated above in HPI Physical Exam Triage Vital Signs ED Triage Vitals  Enc Vitals Group     BP 09/23/21 1642 (!) 162/90     Pulse Rate 09/23/21 1642 73     Resp 09/23/21 1642 18     Temp 09/23/21 1642 99.1 F (37.3 C)     Temp Source 09/23/21 1642 Oral     SpO2 09/23/21 1642 99 %     Weight --      Height --      Head Circumference --      Peak Flow --      Pain Score 09/23/21 1639 8     Pain Loc --      Pain Edu? --      Excl. in [place]? --    No data found.  Updated Vital Signs BP (!) 162/90 (BP Location: Left Arm)   Pulse 73   Temp 99.1 F (37.3 C) (Oral)   Resp 18   LMP 11/15/2014   SpO2 99%   Physical Exam Vitals and nursing note reviewed.  Constitutional:      General: She is not in acute distress.    Appearance: She is well-developed. She is not ill-appearing,  toxic-appearing or diaphoretic.  HENT:     Head: Normocephalic and atraumatic.     Right Ear: Tympanic membrane normal.     Left Ear: Tympanic membrane normal.     Nose: Congestion and rhinorrhea present.  Mouth/Throat:     Mouth: Mucous membranes are moist.     Pharynx: No oropharyngeal exudate or posterior oropharyngeal erythema.  Eyes:     Extraocular Movements: Extraocular movements intact.     Pupils: Pupils are equal, round, and reactive to light.  Cardiovascular:     Rate and Rhythm: Normal rate and regular rhythm.     Heart sounds: Normal heart sounds.  Pulmonary:     Effort: Pulmonary effort is normal.     Breath sounds: Normal breath sounds.  Abdominal:     General: Abdomen is flat. Bowel sounds are normal.     Palpations: Abdomen is soft.     Tenderness: There is no abdominal tenderness.     Hernia: No hernia is present.  Musculoskeletal:     Cervical back: Normal range of motion and neck supple. No rigidity.  Lymphadenopathy:     Cervical: No cervical adenopathy.  Skin:    General: Skin is warm.     Coloration: Skin is not cyanotic or jaundiced.     Findings: No rash.  Neurological:     Mental Status: She is alert.     UC Treatments / Results  Labs (all labs ordered are listed, but only abnormal results are displayed) Labs Reviewed  SARS CORONAVIRUS 2 (TAT 6-24 HRS)    EKG   Radiology No results found.  Procedures Procedures (including critical care time)  Medications Ordered in UC Medications - No data to display  Initial Impression / Assessment and Plan / UC Course  I have reviewed the triage vital signs and the nursing notes.  Pertinent labs & imaging results that were available during my care of the patient were reviewed by me and considered in my medical decision making (see chart for details).     New. Appears viral in nature. Viral testing pending. For now rest, hydration, band bulky diet, flonase and tessalon. Discussed red flag  signs and symptoms. Follow up PRN.  Final Clinical Impressions(s) / UC Diagnoses   Final diagnoses:  Viral URI with cough  Loose stools   Discharge Instructions   None    ED Prescriptions   None    PDMP not reviewed this encounter.   Hughie Closs, [place] 09/23/21 1752

## 2021-09-24 LAB — SARS CORONAVIRUS 2 (TAT 6-24 HRS): SARS Coronavirus 2: NEGATIVE

## 2021-09-28 ENCOUNTER — Other Ambulatory Visit: Payer: Self-pay

## 2021-09-29 ENCOUNTER — Other Ambulatory Visit: Payer: Self-pay

## 2021-10-31 ENCOUNTER — Other Ambulatory Visit: Payer: Self-pay

## 2021-11-02 ENCOUNTER — Other Ambulatory Visit: Payer: Self-pay

## 2021-11-07 ENCOUNTER — Ambulatory Visit: Payer: Medicaid Other | Admitting: Family Medicine

## 2021-11-08 ENCOUNTER — Ambulatory Visit: Payer: Self-pay | Attending: Family Medicine | Admitting: Nurse Practitioner

## 2021-11-08 ENCOUNTER — Other Ambulatory Visit: Payer: Self-pay

## 2021-11-08 ENCOUNTER — Encounter: Payer: Self-pay | Admitting: Nurse Practitioner

## 2021-11-08 VITALS — BP 179/106 | HR 50 | Ht <= 58 in | Wt 120.1 lb

## 2021-11-08 DIAGNOSIS — E785 Hyperlipidemia, unspecified: Secondary | ICD-10-CM

## 2021-11-08 DIAGNOSIS — I10 Essential (primary) hypertension: Secondary | ICD-10-CM

## 2021-11-08 DIAGNOSIS — Z1329 Encounter for screening for other suspected endocrine disorder: Secondary | ICD-10-CM

## 2021-11-08 DIAGNOSIS — R232 Flushing: Secondary | ICD-10-CM

## 2021-11-08 DIAGNOSIS — J328 Other chronic sinusitis: Secondary | ICD-10-CM

## 2021-11-08 DIAGNOSIS — K2970 Gastritis, unspecified, without bleeding: Secondary | ICD-10-CM

## 2021-11-08 DIAGNOSIS — K2990 Gastroduodenitis, unspecified, without bleeding: Secondary | ICD-10-CM

## 2021-11-08 DIAGNOSIS — G4709 Other insomnia: Secondary | ICD-10-CM

## 2021-11-08 DIAGNOSIS — Z23 Encounter for immunization: Secondary | ICD-10-CM

## 2021-11-08 MED ORDER — CLONIDINE HCL 0.1 MG PO TABS
0.1000 mg | ORAL_TABLET | Freq: Every day | ORAL | 6 refills | Status: DC
  Filled 2021-11-08: qty 30, 30d supply, fill #0
  Filled 2021-12-04: qty 30, 30d supply, fill #1
  Filled 2021-12-04: qty 30, 30d supply, fill #0
  Filled 2022-01-08: qty 30, 30d supply, fill #1

## 2021-11-08 MED ORDER — CETIRIZINE HCL 10 MG PO TABS
ORAL_TABLET | Freq: Every day | ORAL | 1 refills | Status: DC
  Filled 2021-11-08 – 2021-12-14 (×4): qty 30, 30d supply, fill #0
  Filled 2021-12-14: qty 30, 30d supply, fill #1

## 2021-11-08 MED ORDER — PANTOPRAZOLE SODIUM 40 MG PO TBEC
40.0000 mg | DELAYED_RELEASE_TABLET | Freq: Every day | ORAL | 3 refills | Status: DC
  Filled 2021-11-08: qty 90, 90d supply, fill #0
  Filled 2021-12-04: qty 30, 30d supply, fill #0
  Filled 2021-12-04: qty 90, 90d supply, fill #0
  Filled 2022-01-08: qty 30, 30d supply, fill #1

## 2021-11-08 MED ORDER — ATORVASTATIN CALCIUM 20 MG PO TABS
20.0000 mg | ORAL_TABLET | Freq: Every day | ORAL | 1 refills | Status: DC
  Filled 2021-11-08: qty 30, 30d supply, fill #0
  Filled 2021-12-14: qty 30, 30d supply, fill #1
  Filled 2021-12-14: qty 30, 30d supply, fill #0
  Filled 2022-01-08: qty 30, 30d supply, fill #1

## 2021-11-08 MED ORDER — LOSARTAN POTASSIUM 100 MG PO TABS
100.0000 mg | ORAL_TABLET | Freq: Every day | ORAL | 0 refills | Status: DC
  Filled 2021-11-08: qty 30, 30d supply, fill #0
  Filled 2021-12-14: qty 30, 30d supply, fill #1
  Filled 2021-12-14: qty 30, 30d supply, fill #0
  Filled 2022-01-08: qty 30, 30d supply, fill #1

## 2021-11-08 MED ORDER — CARVEDILOL 6.25 MG PO TABS
6.2500 mg | ORAL_TABLET | Freq: Two times a day (BID) | ORAL | 0 refills | Status: DC
  Filled 2021-11-08 – 2021-12-14 (×3): qty 180, 90d supply, fill #0

## 2021-11-08 MED ORDER — SPIRONOLACTONE 25 MG PO TABS
25.0000 mg | ORAL_TABLET | Freq: Every day | ORAL | 0 refills | Status: DC
  Filled 2021-11-08: qty 30, 30d supply, fill #0
  Filled 2021-12-04: qty 30, 30d supply, fill #1
  Filled 2021-12-04: qty 30, 30d supply, fill #0
  Filled 2022-01-08: qty 30, 30d supply, fill #1

## 2021-11-08 MED ORDER — TRAZODONE HCL 50 MG PO TABS
50.0000 mg | ORAL_TABLET | Freq: Every day | ORAL | 1 refills | Status: DC
  Filled 2021-11-08 – 2021-12-14 (×2): qty 30, 30d supply, fill #0
  Filled 2021-12-14 – 2022-01-08 (×2): qty 30, 30d supply, fill #1

## 2021-11-08 NOTE — Progress Notes (Signed)
Assessment & Plan:  Alice Woodard was seen today for hypertension.  Diagnoses and all orders for this visit:  Essential hypertension, benign -     carvedilol (COREG) 6.25 MG tablet; Take 1 tablet (6.25 mg total) by mouth 2 (two) times daily with a meal. -     spironolactone (ALDACTONE) 25 MG tablet; Take 1 tablet (25 mg total) by mouth daily. -     losartan (COZAAR) 100 MG tablet; Take 1 tablet (100 mg total) by mouth daily. -     CMP14+EGFR No changes with medications today as it appears she is currently not medication adherent.   Hot flashes -     cloNIDine (CATAPRES) 0.1 MG tablet; Take 1 tablet (0.1 mg total) by mouth at bedtime. For hotflashes  Other insomnia -     traZODone (DESYREL) 50 MG tablet; Take 1 tablet (50 mg total) by mouth at bedtime. Insomnia relieved completely with trazodone.   Thyroid disorder screening -     Thyroid Panel With TSH Well controlled Lab Results  Component Value Date   TSH 1.79 08/10/2016     Dyslipidemia, goal LDL below 100 -     Lipid panel  Other chronic sinusitis -     cetirizine (ZYRTEC) 10 MG tablet; TAKE 1 TABLET (10 MG TOTAL) BY MOUTH DAILY. She requested benadryl however I have instructed her that I will refill zyrtec instead.   Need for influenza vaccination -     Flu Vaccine QUAD 59moIM (Fluarix, Fluzone & Alfiuria Quad PF)  Gastritis and gastroduodenitis -     pantoprazole (PROTONIX) 40 MG tablet; Take 1 tablet (40 mg total) by mouth daily. 30 minutes before breakfast INSTRUCTIONS: Avoid GERD Triggers: acidic, spicy or fried foods, caffeine, coffee, sodas,  alcohol and chocolate.      Patient has been counseled on age-appropriate routine health concerns for screening and prevention. These are reviewed and up-to-date. Referrals have been placed accordingly. Immunizations are up-to-date or declined.    Subjective:   Chief Complaint  Patient presents with   Hypertension   HPI Alice Woodard [id number].o. 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 Seen with the aid of a Nepali video interpreter VRI was used to communicate directly with patient for the entire encounter including providing detailed patient instructions.     Blood pressure is poorly controlled. She is only taking carvedilol at this time. She has 2 medications with her today and one is coreg the other bottle is pantoprazole. She is unable to recall what other medications she may have at home and states she was told by her children that she just needed to ask for refills of her medications.  BP Readings from Last 3 Encounters:  11/08/21 (!) 179/106  09/23/21 (!) 162/90  08/10/21 (!) 157/93     ROS  Past Medical History:  Diagnosis Date   Anemia    History of blood transfusion 04/17/2017   "anemic"   Hyperlipidemia    Hypertension     Past Surgical History:  Procedure Laterality Date   APPENDECTOMY     CHOLECYSTECTOMY OPEN     ESOPHAGOGASTRODUODENOSCOPY N/A 04/18/2017   Procedure: ESOPHAGOGASTRODUODENOSCOPY (EGD);  Surgeon: NMauri Pole MD;  [facility];  Service: Endoscopy;  Laterality: N/A;   UPPER GASTROINTESTINAL ENDOSCOPY  04/2017    Family History  Problem Relation Age of Onset   Cancer - Other Mother        blood   Colon cancer Neg Hx    Esophageal cancer Neg Hx    Rectal cancer Neg Hx    Stomach cancer Neg Hx     Social History Reviewed with no changes to be made today.   Outpatient Medications Prior to Visit  Medication Sig Dispense Refill   diphenhydrAMINE (BENADRYL) 25 mg capsule Take 1 capsule (25 mg total) by mouth every 6 (six) hours as needed for itching. 20 capsule 0   carvedilol (COREG) 6.25 MG tablet TAKE 1 TABLET (6.25 MG TOTAL) BY MOUTH 2 (TWO) TIMES DAILY WITH A MEAL. 60 tablet 6   cetirizine  (ZYRTEC) 10 MG tablet TAKE 1 TABLET (10 MG TOTAL) BY MOUTH DAILY. 30 tablet 1   losartan (COZAAR) 100 MG tablet Take 1 tablet (100 mg total) by mouth daily. 90 tablet 1   pantoprazole (PROTONIX) 40 MG tablet Take 1 tablet (40 mg total) by mouth daily. 30 minutes before breakfast 90 tablet 3   atorvastatin (LIPITOR) 20 MG tablet Take 1 tablet (20 mg total) by mouth daily. (Patient not taking: Reported on 11/08/2021) 90 tablet 1   diclofenac Sodium (VOLTAREN) 1 % GEL Apply 4 g topically 4 (four) times daily. (Patient not taking: Reported on 11/08/2021) 100 g 1   gabapentin (NEURONTIN) 300 MG capsule TAKE 1 CAPSULE (300 MG TOTAL) BY MOUTH AT BEDTIME. 60 capsule 6   hydrocortisone 2.5 % lotion Apply topically 2 (two) times daily. Apply to affected areas twice daily. (Patient not taking: Reported on 11/08/2021) 59 mL 0   Hyoscyamine Sulfate SL (LEVSIN/SL) 0.125 MG SUBL Place 0.125 mg under the tongue 2 (two) times daily as needed. (Patient not taking: Reported on 11/08/2021) 60 tablet 11   methylPREDNISolone (MEDROL DOSEPAK) 4 MG TBPK tablet Day 1: 8 mg by mouth before breakfast, 4 mg after lunch and after dinner, and 8 mg at bedtime   Day 2: 4 mg by mouth before breakfast, after lunch, and after dinner and 8 mg at bedtime   Day 3: 4 mg by mouth before breakfast, after lunch, after dinner, and at bedtime   Day 4: 4 mg by mouth before breakfast, after lunch, and at bedtime   Day 5: 4 mg by mouth before breakfast and at bedtime   Day 6: 4 mg by mouth before breakfast (Patient not taking: Reported on 11/08/2021) 21 tablet 0   polyethylene glycol powder (GLYCOLAX/MIRALAX) 17 GM/SCOOP powder Use 1 capful two times daily as needed. (Patient not taking: Reported on 11/08/2021) 17 g 0   zolpidem (AMBIEN) 5 MG tablet Take 1 tablet (5 mg total) by mouth at bedtime as needed for sleep. (Patient not taking: Reported on 11/08/2021) 6 tablet 0   cloNIDine (CATAPRES) 0.1 MG tablet Take 1 tablet (0.1 mg total) by mouth  at bedtime. For hotflashes (Patient not taking: Reported on 11/08/2021) 30 tablet 6   spironolactone (ALDACTONE) 25 MG tablet Take 1 tablet (25 mg total) by mouth daily. (Patient not taking: Reported on 11/08/2021) 90 tablet 1   traZODone (DESYREL) 50 MG tablet Take 1 tablet (50 mg total) by mouth at bedtime. (Patient not taking: Reported on 11/08/2021) 90 tablet 1   Facility-Administered Medications Prior to Visit  Medication Dose Route Frequency Provider Last Rate Last Admin   0.9 %  sodium chloride infusion  500 mL Intravenous Once Nandigam, Venia Minks, MD  Allergies  Allergen Reactions   Amlodipine Rash       Objective:    BP (!) 179/106    Pulse (!) 50    Ht '4\' 10"'  (1.473 m)    Wt 120 lb 2 oz (54.5 kg)    LMP 11/15/2014    SpO2 100%    BMI 25.11 kg/m  Wt Readings from Last 3 Encounters:  11/08/21 120 lb 2 oz (54.5 kg)  07/27/21 125 lb (56.7 kg)  07/13/21 125 lb 8 oz (56.9 kg)    Physical Exam       Patient has been counseled extensively about nutrition and exercise as well as the importance of adherence with medications and regular follow-up. The patient was given clear instructions to go to ER or return to medical center if symptoms don't improve, worsen or new problems develop. The patient verbalized understanding.   Follow-up: Return in about 3 months (around 02/06/2022) for HTN f/u with NEWLIN.   Gildardo Pounds, FNP-BC [facility] [place] [facility], [place]   11/08/2021, 2:43 PM

## 2021-11-09 ENCOUNTER — Other Ambulatory Visit: Payer: Self-pay

## 2021-11-09 LAB — LIPID PANEL
Chol/HDL Ratio: 6.3 ratio — ABNORMAL HIGH (ref 0.0–4.4)
Cholesterol, Total: 200 mg/dL — ABNORMAL HIGH (ref 100–199)
HDL: 32 mg/dL — ABNORMAL LOW (ref >39)
LDL Chol Calc (NIH): 121 mg/dL — ABNORMAL HIGH (ref 0–99)
Triglycerides: 265 mg/dL — ABNORMAL HIGH (ref 0–149)
VLDL Cholesterol Cal: 47 mg/dL — ABNORMAL HIGH (ref 5–40)

## 2021-11-09 LAB — THYROID PANEL WITH TSH
Free Thyroxine Index: 1.7 (ref 1.2–4.9)
T3 Uptake Ratio: 23 % — ABNORMAL LOW (ref 24–39)
T4, Total: 7.6 ug/dL (ref 4.5–12.0)
TSH: 2.04 u[IU]/mL (ref 0.450–4.500)

## 2021-11-09 LAB — CMP14+EGFR
ALT: 25 IU/L (ref 0–32)
AST: 31 IU/L (ref 0–40)
Albumin/Globulin Ratio: 2 (ref 1.2–2.2)
Albumin: 4.5 g/dL (ref 3.8–4.9)
Alkaline Phosphatase: 152 IU/L — ABNORMAL HIGH (ref 44–121)
BUN/Creatinine Ratio: 12 (ref 9–23)
BUN: 10 mg/dL (ref 6–24)
Bilirubin Total: 0.9 mg/dL (ref 0.0–1.2)
CO2: 22 mmol/L (ref 20–29)
Calcium: 9 mg/dL (ref 8.7–10.2)
Chloride: 105 mmol/L (ref 96–106)
Creatinine, Ser: 0.82 mg/dL (ref 0.57–1.00)
Globulin, Total: 2.2 g/dL (ref 1.5–4.5)
Glucose: 87 mg/dL (ref 70–99)
Potassium: 3.9 mmol/L (ref 3.5–5.2)
Sodium: 141 mmol/L (ref 134–144)
Total Protein: 6.7 g/dL (ref 6.0–8.5)
eGFR: 86 mL/min/{1.73_m2} (ref >59)

## 2021-11-13 ENCOUNTER — Other Ambulatory Visit: Payer: Self-pay

## 2021-12-04 ENCOUNTER — Other Ambulatory Visit: Payer: Self-pay

## 2021-12-05 ENCOUNTER — Telehealth: Payer: Self-pay | Admitting: Family Medicine

## 2021-12-05 NOTE — Telephone Encounter (Signed)
Pt's daughter given lab results per notes of Geryl Rankins NP on 11/14/21. Pt's daughter verbalized understanding.

## 2021-12-14 ENCOUNTER — Other Ambulatory Visit: Payer: Self-pay

## 2021-12-15 ENCOUNTER — Other Ambulatory Visit: Payer: Self-pay

## 2022-01-08 ENCOUNTER — Other Ambulatory Visit: Payer: Self-pay

## 2022-01-08 ENCOUNTER — Other Ambulatory Visit: Payer: Self-pay | Admitting: Nurse Practitioner

## 2022-01-08 ENCOUNTER — Telehealth: Payer: Self-pay | Admitting: Family Medicine

## 2022-01-08 DIAGNOSIS — I10 Essential (primary) hypertension: Secondary | ICD-10-CM

## 2022-01-08 MED ORDER — CARVEDILOL 6.25 MG PO TABS
6.2500 mg | ORAL_TABLET | Freq: Two times a day (BID) | ORAL | 0 refills | Status: DC
  Filled 2022-01-08: qty 180, 90d supply, fill #0

## 2022-01-08 NOTE — Telephone Encounter (Signed)
Copied from [place] [id number]. Topic: Appointment Scheduling - Scheduling Inquiry for Clinic >> Jan 08, 2022  2:56 PM Tessa Lerner A wrote: Reason for CRM: The patient would like to be seen for financial counseling   The patient would like to reapply for their orange card  Please contact further when available

## 2022-01-08 NOTE — Telephone Encounter (Signed)
I return Pt call, schedule a financial appt

## 2022-01-12 ENCOUNTER — Other Ambulatory Visit: Payer: Self-pay

## 2022-01-16 ENCOUNTER — Ambulatory Visit: Payer: Self-pay | Attending: Family Medicine

## 2022-01-16 ENCOUNTER — Other Ambulatory Visit: Payer: Self-pay

## 2022-02-08 ENCOUNTER — Ambulatory Visit: Payer: Self-pay | Attending: Family Medicine | Admitting: Family Medicine

## 2022-02-08 ENCOUNTER — Other Ambulatory Visit: Payer: Self-pay

## 2022-02-08 VITALS — BP 136/82 | HR 51 | Ht <= 58 in | Wt 115.2 lb

## 2022-02-08 DIAGNOSIS — K5909 Other constipation: Secondary | ICD-10-CM

## 2022-02-08 DIAGNOSIS — G4709 Other insomnia: Secondary | ICD-10-CM

## 2022-02-08 DIAGNOSIS — H9313 Tinnitus, bilateral: Secondary | ICD-10-CM

## 2022-02-08 DIAGNOSIS — T7840XA Allergy, unspecified, initial encounter: Secondary | ICD-10-CM

## 2022-02-08 DIAGNOSIS — K279 Peptic ulcer, site unspecified, unspecified as acute or chronic, without hemorrhage or perforation: Secondary | ICD-10-CM

## 2022-02-08 DIAGNOSIS — E785 Hyperlipidemia, unspecified: Secondary | ICD-10-CM

## 2022-02-08 DIAGNOSIS — I10 Essential (primary) hypertension: Secondary | ICD-10-CM

## 2022-02-08 MED ORDER — HYDROXYZINE HCL 25 MG PO TABS
25.0000 mg | ORAL_TABLET | Freq: Every evening | ORAL | 3 refills | Status: DC | PRN
  Filled 2022-02-08: qty 30, 30d supply, fill #0

## 2022-02-08 MED ORDER — POLYETHYLENE GLYCOL 3350 17 GM/SCOOP PO POWD
17.0000 g | Freq: Every day | ORAL | 1 refills | Status: DC
  Filled 2022-02-08: qty 510, 30d supply, fill #0

## 2022-02-08 MED ORDER — TRIAMCINOLONE ACETONIDE 0.1 % EX CREA
1.0000 "application " | TOPICAL_CREAM | Freq: Two times a day (BID) | CUTANEOUS | 1 refills | Status: DC
  Filled 2022-02-08: qty 45, 23d supply, fill #0

## 2022-02-08 NOTE — Progress Notes (Signed)
? ?Subjective:  ?Patient ID: Alice Woodard, female    DOB: [birth year]  Age: 51 y.o. MRN: [id number] ? ?CC: Abdominal Pain ? ? ?HPI ?Alice Woodard is a 51 y.o. 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 ?Seen with the aid of a [place] video interpreter. ?Accompanied by her Daughter in law. ? ?Interval History: ?Complains that abdominal pain is uncontrolled regardless of what medication she takes. Pain is in her RUQ. She also had an episode of red blood in her stool.  Denies presence of nausea, vomiting, diarrhea but she does have constipation. ?Currently on a PPI. ?She underwent an EGD in 07/2021 which revealed: ?- Z-line regular, 35 cm from the incisors. ?- No gross lesions in esophagus. ?- Erythematous mucosa in the antrum and prepyloric region of the stomach. Biopsied. ?- Non-bleeding gastric ulcers with no stigmata of bleeding. Biopsied. ?- Duodenal erosions without bleeding. Biopsied. ? ? ?Pathology revealed: ?Diagnosis ?1. Surgical [P], duodenal ?- PEPTIC DUODENITIS. ?- NO DYSPLASIA OR MALIGNANCY. ?2. Surgical [P], gastric ?- REACTIVE GASTROPATHY WITH FOCAL INTESTINAL METAPLASIA. ?- WARTHIN-STARRY IS NEGATIVE FOR HELICOBACTER PYLORI. ?- NO DYSPLASIA OR MALIGNANCY. ? ?She complains of pruritus in her legs subsequently developing a  rash when she goes for a walk and the plants touch her. When she touches chemicals she also has itching. ?She is on Trazodone which is ineffective for her insomnia as she has a hard time falling asleep and she wakes up complaining of dizziness and memory problems ? ?Also complains of tinnitus. ?Past Medical History:  ?Diagnosis Date  ? Anemia   ? History of blood transfusion 04/17/2017  ? "anemic"  ? Hyperlipidemia   ? Hypertension   ? ? ?Past Surgical History:  ?Procedure Laterality Date  ? APPENDECTOMY    ?  CHOLECYSTECTOMY OPEN    ? ESOPHAGOGASTRODUODENOSCOPY N/A 04/18/2017  ? Procedure: ESOPHAGOGASTRODUODENOSCOPY (EGD);  Surgeon: Mauri Pole, MD;  Location: [facility] ENDOSCOPY;  Service: Endoscopy;  Laterality: N/A;  ? UPPER GASTROINTESTINAL ENDOSCOPY  04/2017  ? ? ?Family History  ?Problem Relation Age of Onset  ? Cancer - Other Mother   ?     blood  ? Colon cancer Neg Hx   ? Esophageal cancer Neg Hx   ? Rectal cancer Neg Hx   ? Stomach cancer Neg Hx   ? ? ?Social History  ? ?Socioeconomic History  ? Marital status: Single  ?  Spouse name: Not on file  ? Number of children: 2  ? Years of education: Not on file  ? Highest education level: Not on file  ?Occupational History  ? Not on file  ?Tobacco Use  ? Smoking status: Never  ? Smokeless tobacco: Never  ?Vaping Use  ? Vaping Use: Never used  ?Substance and Sexual Activity  ? Alcohol use: No  ? Drug use: No  ? Sexual activity: Not on file  ?Other Topics Concern  ? Not on file  ?Social History Narrative  ? Not on file  ? ?Social Determinants of Health  ? ?Financial Resource Strain: Not on file  ?Food Insecurity: Not on file  ?Transportation Needs: Not on file  ?Physical Activity: Not on file  ?Stress: Not on file  ?Social Connections: Not on file  ? ? ?Allergies  ?Allergen Reactions  ? Amlodipine Rash  ? ? ?Outpatient Medications Prior to Visit  ?Medication Sig Dispense Refill  ? atorvastatin (LIPITOR)  20 MG tablet Take 1 tablet (20 mg total) by mouth daily. 90 tablet 1  ? carvedilol (COREG) 6.25 MG tablet Take 1 tablet (6.25 mg total) by mouth 2 (two) times daily with a meal. 180 tablet 0  ? cloNIDine (CATAPRES) 0.1 MG tablet Take 1 tablet (0.1 mg total) by mouth at bedtime. For hot flashes 30 tablet 6  ? diclofenac Sodium (VOLTAREN) 1 % GEL Apply 4 g topically 4 (four) times daily. 100 g 1  ? hydrocortisone 2.5 % lotion Apply topically 2 (two) times daily. Apply to affected areas twice daily. 59 mL 0  ? Hyoscyamine Sulfate SL (LEVSIN/SL) 0.125 MG SUBL Place 0.125 mg  under the tongue 2 (two) times daily as needed. 60 tablet 11  ? methylPREDNISolone (MEDROL DOSEPAK) 4 MG TBPK tablet Day 1: 8 mg by mouth before breakfast, 4 mg after lunch and after dinner, and 8 mg at bedtime  ? Day 2: 4 mg by mouth before breakfast, after lunch, and after dinner and 8 mg at bedtime  ? Day 3: 4 mg by mouth before breakfast, after lunch, after dinner, and at bedtime  ? Day 4: 4 mg by mouth before breakfast, after lunch, and at bedtime  ? Day 5: 4 mg by mouth before breakfast and at bedtime  ? Day 6: 4 mg by mouth before breakfast (Patient taking differently: Day 1: 8 mg by mouth before breakfast, 4 mg after lunch and after dinner, and 8 mg at bedtime  ? Day 2: 4 mg by mouth before breakfast, after lunch, and after dinner and 8 mg at bedtime  ? Day 3: 4 mg by mouth before breakfast, after lunch, after dinner, and at bedtime  ? Day 4: 4 mg by mouth before breakfast, after lunch, and at bedtime  ? Day 5: 4 mg by mouth before breakfast and at bedtime  ? Day 6: 4 mg by mouth before breakfast) 21 tablet 0  ? pantoprazole (PROTONIX) 40 MG tablet Take 1 tablet (40 mg total) by mouth daily. 30 minutes before breakfast 90 tablet 3  ? polyethylene glycol powder (GLYCOLAX/MIRALAX) 17 GM/SCOOP powder Use 1 capful two times daily as needed. 17 g 0  ? zolpidem (AMBIEN) 5 MG tablet Take 1 tablet (5 mg total) by mouth at bedtime as needed for sleep. 6 tablet 0  ? diphenhydrAMINE (BENADRYL) 25 mg capsule Take 1 capsule (25 mg total) by mouth every 6 (six) hours as needed for itching. 20 capsule 0  ? losartan (COZAAR) 100 MG tablet Take 1 tablet (100 mg total) by mouth daily. 90 tablet 0  ? spironolactone (ALDACTONE) 25 MG tablet Take 1 tablet (25 mg total) by mouth daily. 90 tablet 0  ? traZODone (DESYREL) 50 MG tablet Take 1 tablet (50 mg total) by mouth at bedtime. 90 tablet 1  ? cetirizine (ZYRTEC) 10 MG tablet TAKE 1 TABLET (10 MG TOTAL) BY MOUTH DAILY. 90 tablet 1  ? gabapentin (NEURONTIN) 300 MG capsule TAKE 1  CAPSULE (300 MG TOTAL) BY MOUTH AT BEDTIME. 60 capsule 6  ? ?Facility-Administered Medications Prior to Visit  ?Medication Dose Route Frequency Provider Last Rate Last Admin  ? 0.9 %  sodium chloride infusion  500 mL Intravenous Once Nandigam, Venia Minks, MD      ? ? ? ?ROS ?Review of Systems  ?Constitutional:  Negative for activity change, appetite change and fatigue.  ?HENT:  Negative for congestion, sinus pressure and sore throat.   ?Eyes:  Negative for visual disturbance.  ?  Respiratory:  Negative for cough, chest tightness, shortness of breath and wheezing.   ?Cardiovascular:  Negative for chest pain and palpitations.  ?Gastrointestinal:  Positive for abdominal distention and blood in stool. Negative for abdominal pain and constipation.  ?Endocrine: Negative for polydipsia.  ?Genitourinary:  Negative for dysuria and frequency.  ?Musculoskeletal:  Negative for arthralgias and back pain.  ?Skin:  Positive for rash.  ?Neurological:  Negative for tremors, light-headedness and numbness.  ?Hematological:  Does not bruise/bleed easily.  ?Psychiatric/Behavioral:  Positive for sleep disturbance. Negative for agitation and behavioral problems.   ? ?Objective:  ?BP 136/82   Pulse (!) 51   Ht '4\' 10"'$  (1.473 m)   Wt 115 lb 3.2 oz (52.3 kg)   LMP 11/15/2014   SpO2 100%   BMI 24.08 kg/m?  ? ? ?  02/08/2022  ? 11:10 AM 11/08/2021  ? 10:40 AM 09/23/2021  ?  4:42 PM  ?BP/Weight  ?Systolic BP 269 485 462  ?Diastolic BP 82 703 90  ?Wt. (Lbs) 115.2 120.13   ?BMI 24.08 kg/m2 25.11 kg/m2   ? ? ? ? ?Physical Exam ?Constitutional:   ?   Appearance: She is well-developed.  ?HENT:  ?   Right Ear: There is impacted cerumen.  ?   Left Ear: There is no impacted cerumen.  ?Cardiovascular:  ?   Rate and Rhythm: Bradycardia present.  ?   Heart sounds: Normal heart sounds. No murmur heard. ?Pulmonary:  ?   Effort: Pulmonary effort is normal.  ?   Breath sounds: Normal breath sounds. No wheezing or rales.  ?Chest:  ?   Chest wall: No  tenderness.  ?Abdominal:  ?   General: Bowel sounds are normal. There is no distension.  ?   Palpations: Abdomen is soft. There is no mass.  ?   Tenderness: There is no abdominal tenderness.  ?Musculoskeletal:     ?   General: N

## 2022-02-08 NOTE — Patient Instructions (Signed)
Tinnitus ?Tinnitus refers to hearing a sound when there is no actual source for that sound. This is often described as ringing in the ears. However, people with this condition may hear a variety of noises, in one ear or in both ears. ?The sounds of tinnitus can be soft, loud, or somewhere in between. Tinnitus can last for a few seconds or can be constant for days. It may go away without treatment and come back at various times. When tinnitus is constant or happens often, it can lead to other problems, such as trouble sleeping and trouble concentrating. ?Almost everyone experiences tinnitus at some point. Tinnitus is not the same as hearing loss. Tinnitus that is long-lasting (chronic) or comes back often (recurs) may require medical attention. ?What are the causes? ?The cause of tinnitus is often not known. In some cases, it can result from: ?Exposure to loud noises from machinery, music, or other sources. ?An object (foreign body) stuck in the ear. ?Earwax buildup. ?Drinking alcohol or caffeine. ?Taking certain medicines. ?Age-related hearing loss. ?It may also be caused by medical conditions such as: ?Ear or sinus infections. ?Heart diseases or high blood pressure. ?Allergies. ?M?ni?re's disease. ?Thyroid problems. ?Tumors. ?A weak, bulging blood vessel (aneurysm) near the ear. ?What increases the risk? ?The following factors may make you more likely to develop this condition: ?Exposure to loud noises. ?Age. Tinnitus is more likely in older individuals. ?Using alcohol or tobacco. ?What are the signs or symptoms? ?The main symptom of tinnitus is hearing a sound when there is no source for that sound. It may sound like: ?Buzzing. ?Sizzling. ?Ringing. ?Blowing air. ?Hissing. ?Whistling. ?Other sounds may include: ?Roaring. ?Running water. ?A musical note. ?Tapping. ?Humming. ?Symptoms may affect only one ear (unilateral) or both ears (bilateral). ?How is this diagnosed? ?Tinnitus is diagnosed based on your symptoms,  your medical history, and a physical exam. Your health care provider may do a thorough hearing test (audiologic exam) if your tinnitus: ?Is unilateral. ?Causes hearing difficulties. ?Lasts 6 months or longer. ?You may work with a health care provider who specializes in hearing disorders (audiologist). You may be asked questions about your symptoms and how they affect your daily life. You may have other tests done, such as: ?CT scan. ?MRI. ?An imaging test of how blood flows through your blood vessels (angiogram). ?How is this treated? ?Treating an underlying medical condition can sometimes make tinnitus go away. If your tinnitus continues, other treatments may include: ?Therapy and counseling to help you manage the stress of living with tinnitus. ?Sound generators to mask the tinnitus. These include: ?Tabletop sound machines that play relaxing sounds to help you fall asleep. ?Wearable devices that fit in your ear and play sounds or music. ?Acoustic neural stimulation. This involves using headphones to listen to music that contains an auditory signal. Over time, listening to this signal may change some pathways in your brain and make you less sensitive to tinnitus. This treatment is used for very severe cases when no other treatment is working. ?Using hearing aids or cochlear implants if your tinnitus is related to hearing loss. Hearing aids are worn in the outer ear. Cochlear implants are surgically placed in the inner ear. ?Follow these instructions at home: ?Managing symptoms ?  ?When possible, avoid being in loud places and being exposed to loud sounds. ?Wear hearing protection, such as earplugs, when you are exposed to loud noises. ?Use a white noise machine, a humidifier, or other devices to mask the sound of tinnitus. ?Practice techniques  for reducing stress, such as meditation, yoga, or deep breathing. Work with your health care provider if you need help with managing stress. ?Sleep with your head slightly  raised. This may reduce the impact of tinnitus. ?General instructions ?Do not use stimulants, such as nicotine, alcohol, or caffeine. Talk with your health care provider about other stimulants to avoid. Stimulants are substances that can make you feel alert and attentive by increasing certain activities in the body (such as heart rate and blood pressure). These substances may make tinnitus worse. ?Take over-the-counter and prescription medicines only as told by your health care provider. ?Try to get plenty of sleep each night. ?Keep all follow-up visits. This is important. ?Contact a health care provider if: ?Your tinnitus continues for 3 weeks or longer without stopping. ?You develop sudden hearing loss. ?Your symptoms get worse or do not get better with home care. ?You feel you are not able to manage the stress of living with tinnitus. ?Get help right away if: ?You develop tinnitus after a head injury. ?You have tinnitus along with any of the following: ?Dizziness. ?Nausea and vomiting. ?Loss of balance. ?Sudden, severe headache. ?Vision changes. ?Facial weakness or weakness of arms or legs. ?These symptoms may represent a serious problem that is an emergency. Do not wait to see if the symptoms will go away. Get medical help right away. Call your local emergency services (911 in the [place] Do not drive yourself to the hospital. ?Summary ?Tinnitus refers to hearing a sound when there is no actual source for that sound. This is often described as ringing in the ears. ?Symptoms may affect only one ear (unilateral) or both ears (bilateral). ?Use a white noise machine, a humidifier, or other devices to mask the sound of tinnitus. ?Do not use stimulants, such as nicotine, alcohol, or caffeine. These substances may make tinnitus worse. ?This information is not intended to replace advice given to you by your health care provider. Make sure you discuss any questions you have with your health care provider. ?Document  Revised: 10/03/2020 Document Reviewed: 10/03/2020 ?Elsevier Patient Education ? Alice Woodard. ? ?

## 2022-02-15 ENCOUNTER — Other Ambulatory Visit: Payer: Self-pay

## 2022-03-15 ENCOUNTER — Ambulatory Visit: Payer: Medicaid Other | Admitting: Nurse Practitioner

## 2022-04-05 ENCOUNTER — Other Ambulatory Visit: Payer: Self-pay

## 2022-04-05 ENCOUNTER — Encounter: Payer: Self-pay | Admitting: Nurse Practitioner

## 2022-04-05 ENCOUNTER — Other Ambulatory Visit (INDEPENDENT_AMBULATORY_CARE_PROVIDER_SITE_OTHER): Payer: Self-pay

## 2022-04-05 ENCOUNTER — Ambulatory Visit (INDEPENDENT_AMBULATORY_CARE_PROVIDER_SITE_OTHER): Payer: Self-pay | Admitting: Nurse Practitioner

## 2022-04-05 VITALS — BP 160/90 | HR 64 | Ht <= 58 in | Wt 114.6 lb

## 2022-04-05 DIAGNOSIS — R1013 Epigastric pain: Secondary | ICD-10-CM

## 2022-04-05 DIAGNOSIS — R1011 Right upper quadrant pain: Secondary | ICD-10-CM

## 2022-04-05 LAB — CBC WITH DIFFERENTIAL/PLATELET
Basophils Absolute: 0 10*3/uL (ref 0.0–0.1)
Basophils Relative: 0.2 % (ref 0.0–3.0)
Eosinophils Absolute: 0.2 10*3/uL (ref 0.0–0.7)
Eosinophils Relative: 2.3 % (ref 0.0–5.0)
HCT: 29.8 % — ABNORMAL LOW (ref 36.0–46.0)
Hemoglobin: 10.5 g/dL — ABNORMAL LOW (ref 12.0–15.0)
Lymphocytes Relative: 29.8 % (ref 12.0–46.0)
Lymphs Abs: 2.3 10*3/uL (ref 0.7–4.0)
MCHC: 35.1 g/dL (ref 30.0–36.0)
MCV: 111.1 fl — ABNORMAL HIGH (ref 78.0–100.0)
Monocytes Absolute: 0.4 10*3/uL (ref 0.1–1.0)
Monocytes Relative: 4.7 % (ref 3.0–12.0)
Neutro Abs: 4.8 10*3/uL (ref 1.4–7.7)
Neutrophils Relative %: 63 % (ref 43.0–77.0)
Platelets: 259 10*3/uL (ref 150.0–400.0)
RBC: 2.68 Mil/uL — ABNORMAL LOW (ref 3.87–5.11)
RDW: 18.2 % — ABNORMAL HIGH (ref 11.5–15.5)
WBC: 7.6 10*3/uL (ref 4.0–10.5)

## 2022-04-05 LAB — COMPREHENSIVE METABOLIC PANEL
ALT: 11 U/L (ref 0–35)
AST: 19 U/L (ref 0–37)
Albumin: 4.3 g/dL (ref 3.5–5.2)
Alkaline Phosphatase: 116 U/L (ref 39–117)
BUN: 8 mg/dL (ref 6–23)
CO2: 25 mEq/L (ref 19–32)
Calcium: 9.3 mg/dL (ref 8.4–10.5)
Chloride: 108 mEq/L (ref 96–112)
Creatinine, Ser: 0.65 mg/dL (ref 0.40–1.20)
GFR: 100.71 mL/min (ref >60.00)
Glucose, Bld: 82 mg/dL (ref 70–99)
Potassium: 3.8 mEq/L (ref 3.5–5.1)
Sodium: 142 mEq/L (ref 135–145)
Total Bilirubin: 2.1 mg/dL — ABNORMAL HIGH (ref 0.2–1.2)
Total Protein: 7 g/dL (ref 6.0–8.3)

## 2022-04-05 MED ORDER — PANTOPRAZOLE SODIUM 40 MG PO TBEC
40.0000 mg | DELAYED_RELEASE_TABLET | Freq: Two times a day (BID) | ORAL | 1 refills | Status: DC
  Filled 2022-04-05: qty 60, 30d supply, fill #0
  Filled 2022-09-18 (×2): qty 60, 30d supply, fill #1

## 2022-04-05 NOTE — Progress Notes (Signed)
04/05/2022 Laurell Coalson [id number] [birth year]   CHIEF COMPLAINT: upper abdominal pain   HISTORY OF PRESENT ILLNESS: Camden Mazzaferro is a 51 year old female from [place] with a past medical history of  hypertension, hyperlipidemia and H. Pylori duodenal ulcers 2018. Past cholecystectomy. She is followed by Dr. Silverio Decamp. She presents today accompanied by her daughter and [place] interpreter. She complains of having intermittent epigastric pain which started about 3 months ago which is similar to the upper abdominal pain she had 07/2021. She underwent an EGD 07/27/2021 which showed non bleeding gastric ulcers and duodenal erosions. Gastric biopsies were negative for H. 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 A repeat EGD in 3 years was recommended.   Her epigastric pain is more noticeable after she drinks cold water, eating does not trigger or worsen her pain. She had a few days of nausea in April without vomiting which abated. No heartburn. No NSAID use. No alcohol use. She is passing a normal brown formed stool daily. Occasional constipation. She saw a small amount of red blood on the stool one month ago and a few days ago. She underwent a colonoscopy 05/2019 which identified 4 hyperplastic polyps removed from the colon and rectum and one adenoma removed from the cecum.   Labs 07/06/2021: WBC 10.3. Hg 10. HCT 34.5. T. Bili 0.7. Alk phos 112. AST 26. ALT 23.  EGD 07/27/2021: - Z-line regular, 35 cm from the incisors. - No gross lesions in esophagus. - Erythematous mucosa in the antrum and prepyloric region of the stomach. Biopsied. - Non-bleeding gastric ulcers with no stigmata of bleeding. Biopsied. - Duodenal erosions without bleeding. Biopsied. -Recall EGD 3 years 1. Surgical [P], duodenal - PEPTIC DUODENITIS. - NO DYSPLASIA OR MALIGNANCY. 2. 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 - WARTHIN-STARRY IS NEGATIVE FOR  HELICOBACTER PYLORI. - NO DYSPLASIA OR MALIGNANCY  Colonoscopy 05/20/2019: - Two 4 to 6 mm polyps in the sigmoid colon and in the cecum, removed with a cold snare. Resected and retrieved. - Three 1 to 2 mm polyps in the rectum, removed with a cold biopsy forceps. Resected and retrieved. - Non-bleeding internal hemorrhoids. - 7 year recall 1. Surgical [P], cecum, polyp - TUBULAR ADENOMA(S). - NO HIGH GRADE DYSPLASIA OR MALIGNANCY. 2. Surgical [P], sigmoid and rectum, polyp (4) - HYPERPLASTIC POLYP(S). - NO ADENOMATOUS CHANGE OR MALIGNANCY  Past Medical History:  Diagnosis Date   Anemia    History of blood transfusion 04/17/2017   "anemic"   Hyperlipidemia    Hypertension    Past Surgical History:  Procedure Laterality Date   APPENDECTOMY     CHOLECYSTECTOMY OPEN     ESOPHAGOGASTRODUODENOSCOPY N/A 04/18/2017   Procedure: ESOPHAGOGASTRODUODENOSCOPY (EGD);  Surgeon: Mauri Pole, MD;  Location: [facility] ENDOSCOPY;  Service: Endoscopy;  Laterality: N/A;   UPPER GASTROINTESTINAL ENDOSCOPY  04/2017    Allergies  Allergen Reactions   Amlodipine Rash      Outpatient Encounter Medications as of 04/05/2022  Medication Sig   atorvastatin (LIPITOR) 20 MG tablet Take 1 tablet (20 mg total) by mouth daily.   carvedilol (COREG) 6.25 MG tablet Take 1 tablet (6.25 mg total) by mouth 2 (two) times daily with a meal.   cloNIDine (CATAPRES) 0.1 MG tablet Take 1 tablet (0.1 mg total) by mouth at bedtime. For hot flashes   hydrocortisone 2.5 % lotion Apply topically 2 (two) times daily. Apply to affected areas twice daily.   hydrOXYzine (ATARAX) 25 MG  tablet Take 1 tablet (25 mg total) by mouth at bedtime as needed (insomnia).   pantoprazole (PROTONIX) 40 MG tablet Take 1 tablet (40 mg total) by mouth daily. 30 minutes before breakfast   polyethylene glycol powder (GLYCOLAX/MIRALAX) 17 GM/SCOOP powder Take 17 g by mouth daily. (Patient taking differently: Take 17 g by mouth daily as needed.)    triamcinolone cream (KENALOG) 0.1 % Apply 1 application. topically 2 (two) times daily.   zolpidem (AMBIEN) 5 MG tablet Take 1 tablet (5 mg total) by mouth at bedtime as needed for sleep.   cetirizine (ZYRTEC) 10 MG tablet TAKE 1 TABLET (10 MG TOTAL) BY MOUTH DAILY.   gabapentin (NEURONTIN) 300 MG capsule TAKE 1 CAPSULE (300 MG TOTAL) BY MOUTH AT BEDTIME.   [DISCONTINUED] diclofenac Sodium (VOLTAREN) 1 % GEL Apply 4 g topically 4 (four) times daily.   [DISCONTINUED] Hyoscyamine Sulfate SL (LEVSIN/SL) 0.125 MG SUBL Place 0.125 mg under the tongue 2 (two) times daily as needed.   [DISCONTINUED] methylPREDNISolone (MEDROL DOSEPAK) 4 MG TBPK tablet Day 1: 8 mg by mouth before breakfast, 4 mg after lunch and after dinner, and 8 mg at bedtime   Day 2: 4 mg by mouth before breakfast, after lunch, and after dinner and 8 mg at bedtime   Day 3: 4 mg by mouth before breakfast, after lunch, after dinner, and at bedtime   Day 4: 4 mg by mouth before breakfast, after lunch, and at bedtime   Day 5: 4 mg by mouth before breakfast and at bedtime   Day 6: 4 mg by mouth before breakfast (Patient taking differently: Day 1: 8 mg by mouth before breakfast, 4 mg after lunch and after dinner, and 8 mg at bedtime   Day 2: 4 mg by mouth before breakfast, after lunch, and after dinner and 8 mg at bedtime   Day 3: 4 mg by mouth before breakfast, after lunch, after dinner, and at bedtime   Day 4: 4 mg by mouth before breakfast, after lunch, and at bedtime   Day 5: 4 mg by mouth before breakfast and at bedtime   Day 6: 4 mg by mouth before breakfast)   [DISCONTINUED] 0.9 %  sodium chloride infusion    No facility-administered encounter medications on file as of 04/05/2022.    REVIEW OF SYSTEMS:  Gen: Denies fever, sweats or chills. No weight loss.  CV: Denies chest pain, palpitations or edema. Resp: Denies cough, shortness of breath of hemoptysis.  GI: See HPI.  GU : Denies urinary burning, blood in urine, increased  urinary frequency or incontinence. MS: Denies joint pain, muscles aches or weakness. Derm: Denies rash, itchiness, skin lesions or unhealing ulcers. Psych: Denies depression, anxiety or memory loss. Heme: Denies bruising, easy bleeding. Neuro:  Denies headaches, dizziness or paresthesias. Endo:  Denies any problems with DM, thyroid or adrenal function.  PHYSICAL EXAM: BP (!) 160/90   Pulse 64   Ht '4\' 10"'  (1.473 m)   Wt 114 lb 9.6 oz (52 kg)   LMP 11/15/2014   BMI 23.95 kg/m  General: 51 year old female in NAD.  Head: Normocephalic and atraumatic. Eyes:  Sclerae non-icteric, conjunctive pink. Ears: Normal auditory acuity. Mouth: Dentition intact. No ulcers or lesions.  Neck: Supple, no lymphadenopathy or thyromegaly.  Lungs: Clear bilaterally to auscultation without wheezes, crackles or rhonchi. Heart: Regular rate and rhythm. No murmur, rub or gallop appreciated.  Abdomen: Soft, nontender, non distended. No masses. No hepatosplenomegaly. Normoactive bowel sounds x 4  quadrants. Large RUQ scar. Rectal: Deferred.  Musculoskeletal: Symmetrical with no gross deformities. Skin: Warm and dry. No rash or lesions on visible extremities. Extremities: No edema. Neurological: Alert oriented x 4, no focal deficits.  Psychological:  Alert and cooperative. Normal mood and affect.  ASSESSMENT AND PLAN:  63) 51 year old female with a history of H. Pylori positive duodenal ulcers in 2018, treated with eradication with epigastric pain s/p EGD 07/2021 showed nonbleeding gastric ulcers and duodenal erosions. Gastric biopsies were negative for H. 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 A repeat EGD in 3 years was recommended. She endorses having intermittent epigastric pain x 3 months, nausea for a few days in April.  -CMP, lipase level and CBC -Increase Pantoprzole to 66m bid -GERD diet  -Repeat EGD if no improvement in 2 weeks  -Patient to contact office if symptoms  worsen   2) Rectal bleeding, few episodes one month ago and a few days ago.  -CBC as ordered above  -Patient to contact office if rectal bleeding recurs   3) History of 4 hyperplastic polyps and 1 adenomatous polyp per colonoscopy 05/2019 -Next colon polyp surveillance colonoscopy due 05/2026    CC:  NCharlott Rakes MD

## 2022-04-05 NOTE — Patient Instructions (Addendum)
Your provider has requested that you go to the basement level for lab work before leaving today. Press "B" on the elevator. The lab is located at the first door on the left as you exit the elevator.  Due to recent changes in healthcare laws, you may see the results of your imaging and laboratory studies on MyChart before your provider has had a chance to review them.  We understand that in some cases there may be results that are confusing or concerning to you. Not all laboratory results come back in the same time frame and the provider may be waiting for multiple results in order to interpret others.  Please give [place] 48 hours in order for your provider to thoroughly review all the results before contacting the office for clarification of your results.   We are giving you GERD handout to follow.  Increase your pantoprazole '40mg'$  to one twice a day. We have sent this to your pharmacy.  Contact Colleen in 2 weeks with an update. Phone # [phone].   I appreciate the opportunity to care for you. Carl Best, CRNP

## 2022-04-06 ENCOUNTER — Other Ambulatory Visit: Payer: Self-pay

## 2022-04-06 LAB — LIPASE: Lipase: 44 U/L (ref 11.0–59.0)

## 2022-04-11 ENCOUNTER — Telehealth: Payer: Self-pay | Admitting: Gastroenterology

## 2022-04-11 ENCOUNTER — Other Ambulatory Visit: Payer: Self-pay

## 2022-04-11 DIAGNOSIS — D649 Anemia, unspecified: Secondary | ICD-10-CM

## 2022-04-11 NOTE — Telephone Encounter (Signed)
Patient's daughter, Lalla Brothers, returned your call.  Please call back.  Thank you.

## 2022-04-11 NOTE — Telephone Encounter (Signed)
Addressed in previously created encounter 

## 2022-04-18 ENCOUNTER — Other Ambulatory Visit (INDEPENDENT_AMBULATORY_CARE_PROVIDER_SITE_OTHER): Payer: Self-pay

## 2022-04-18 DIAGNOSIS — D649 Anemia, unspecified: Secondary | ICD-10-CM

## 2022-04-18 LAB — CBC
HCT: 29.1 % — ABNORMAL LOW (ref 36.0–46.0)
Hemoglobin: 10.4 g/dL — ABNORMAL LOW (ref 12.0–15.0)
MCHC: 35.8 g/dL (ref 30.0–36.0)
MCV: 112.2 fl — ABNORMAL HIGH (ref 78.0–100.0)
Platelets: 242 10*3/uL (ref 150.0–400.0)
RBC: 2.6 Mil/uL — ABNORMAL LOW (ref 3.87–5.11)
RDW: 17.6 % — ABNORMAL HIGH (ref 11.5–15.5)
WBC: 6.8 10*3/uL (ref 4.0–10.5)

## 2022-04-18 LAB — IBC + FERRITIN
Ferritin: 65.8 ng/mL (ref 10.0–291.0)
Iron: 105 ug/dL (ref 42–145)
Saturation Ratios: 39.3 % (ref 20.0–50.0)
TIBC: 267.4 ug/dL (ref 250.0–450.0)
Transferrin: 191 mg/dL — ABNORMAL LOW (ref 212.0–360.0)

## 2022-04-18 LAB — B12 AND FOLATE PANEL
Folate: 14.8 ng/mL (ref >5.9)
Vitamin B-12: 58 pg/mL — ABNORMAL LOW (ref 211–911)

## 2022-04-23 NOTE — Progress Notes (Signed)
Reviewed and agree with documentation and assessment and plan. K. Veena Kailer Heindel , MD   

## 2022-05-03 ENCOUNTER — Telehealth: Payer: Self-pay | Admitting: Nurse Practitioner

## 2022-05-03 NOTE — Telephone Encounter (Signed)
Patient's daughter called regarding the results of the recent blood tests done on patient.  Please call and advise.  Thank you.

## 2022-05-03 NOTE — Telephone Encounter (Signed)
Pt daughter called in requesting recent lab results: Pt daughter Lalla Brothers notified that it was documented that an office visit was to be scheduled to discuss labs:  Pt has an office visit scheduled with Dr. Margarita Rana  on 05/24/2022 at 8:30: Hema made aware Hema  verbalized understanding with all questions answered.

## 2022-05-24 ENCOUNTER — Ambulatory Visit: Payer: Medicaid Other | Attending: Family Medicine | Admitting: Family Medicine

## 2022-05-24 ENCOUNTER — Encounter: Payer: Self-pay | Admitting: Family Medicine

## 2022-05-24 ENCOUNTER — Other Ambulatory Visit: Payer: Self-pay

## 2022-05-24 VITALS — BP 185/99 | HR 54 | Temp 98.1°F | Ht <= 58 in | Wt 113.0 lb

## 2022-05-24 DIAGNOSIS — M549 Dorsalgia, unspecified: Secondary | ICD-10-CM

## 2022-05-24 DIAGNOSIS — M7122 Synovial cyst of popliteal space [Baker], left knee: Secondary | ICD-10-CM

## 2022-05-24 DIAGNOSIS — E785 Hyperlipidemia, unspecified: Secondary | ICD-10-CM

## 2022-05-24 DIAGNOSIS — E538 Deficiency of other specified B group vitamins: Secondary | ICD-10-CM

## 2022-05-24 DIAGNOSIS — Z1231 Encounter for screening mammogram for malignant neoplasm of breast: Secondary | ICD-10-CM

## 2022-05-24 DIAGNOSIS — I10 Essential (primary) hypertension: Secondary | ICD-10-CM

## 2022-05-24 DIAGNOSIS — Z131 Encounter for screening for diabetes mellitus: Secondary | ICD-10-CM

## 2022-05-24 MED ORDER — TIZANIDINE HCL 4 MG PO TABS
4.0000 mg | ORAL_TABLET | Freq: Three times a day (TID) | ORAL | 1 refills | Status: DC | PRN
  Filled 2022-05-24: qty 60, 20d supply, fill #0

## 2022-05-24 MED ORDER — DICLOFENAC SODIUM 1 % EX GEL
4.0000 g | Freq: Four times a day (QID) | CUTANEOUS | 1 refills | Status: DC
  Filled 2022-05-24: qty 100, 7d supply, fill #0

## 2022-05-24 MED ORDER — VITAMIN B-12 1000 MCG PO TABS
1000.0000 ug | ORAL_TABLET | Freq: Every day | ORAL | 3 refills | Status: DC
  Filled 2022-05-24: qty 30, 30d supply, fill #0

## 2022-05-24 MED ORDER — CARVEDILOL 6.25 MG PO TABS
6.2500 mg | ORAL_TABLET | Freq: Two times a day (BID) | ORAL | 0 refills | Status: DC
  Filled 2022-05-24: qty 60, 30d supply, fill #0

## 2022-05-24 MED ORDER — CARVEDILOL 6.25 MG PO TABS
6.2500 mg | ORAL_TABLET | Freq: Two times a day (BID) | ORAL | 1 refills | Status: DC
  Filled 2022-05-24: qty 180, 90d supply, fill #0
  Filled 2022-06-25: qty 60, 30d supply, fill #0
  Filled 2022-07-26: qty 60, 30d supply, fill #1
  Filled 2022-08-28: qty 60, 30d supply, fill #2
  Filled 2022-09-27: qty 60, 30d supply, fill #3
  Filled 2022-10-29: qty 60, 30d supply, fill #4
  Filled 2022-11-27: qty 60, 30d supply, fill #5

## 2022-05-24 NOTE — Progress Notes (Signed)
Discuss blood work from GI appointment. Back pain.

## 2022-05-24 NOTE — Patient Instructions (Signed)

## 2022-05-24 NOTE — Progress Notes (Signed)
Subjective:  Patient ID: Alice Woodard, female    DOB: Aug 08, 1971  Age: 51 y.o. MRN: [id number]  CC: Hypertension   HPI Alice Woodard is a 51 y.o. 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 Seen with the aid of a Nepali video interpreter.  Interval History: Her labs from GI had revealed iron deficiency anemia with a hemoglobin of 10.4, vitamin B12 deficiency 58 (normal 211 - 911), decreased ferritin of 191 (normal 212 -360)  Her BP is severely elevated and she is yet to take her antihypertensive as she ran out.  She Complains of back pain after prolonged standing or sitting and it is rated as moderate. She does not take any analgesics for it.  Pain does not radiate down her lower extremities and she denies lower extremity weakness or numbness or tingling.  She complains of a swelling in her posterior left knee which is sometimes tender. Endorses adherence with her statin. Past Medical History:  Diagnosis Date   Anemia    History of blood transfusion 04/17/2017   "anemic"   Hyperlipidemia    Hypertension     Past Surgical History:  Procedure Laterality Date   APPENDECTOMY     CHOLECYSTECTOMY OPEN     ESOPHAGOGASTRODUODENOSCOPY N/A 04/18/2017   Procedure: ESOPHAGOGASTRODUODENOSCOPY (EGD);  Surgeon: Mauri Pole, MD;  Location: [facility] ENDOSCOPY;  Service: Endoscopy;  Laterality: N/A;   UPPER GASTROINTESTINAL ENDOSCOPY  04/2017    Family History  Problem Relation Age of Onset   Cancer - Other Mother        blood   Colon cancer Neg Hx    Esophageal cancer Neg Hx    Rectal cancer Neg Hx    Stomach cancer Neg Hx     Social History   Socioeconomic History   Marital status: Single    Spouse name: Not on file   Number of children: 2   Years of education: Not on file   Highest education level: Not on file   Occupational History   Not on file  Tobacco Use   Smoking status: Never   Smokeless tobacco: Never  Vaping Use   Vaping Use: Never used  Substance and Sexual Activity   Alcohol use: No   Drug use: No   Sexual activity: Not on file  Other Topics Concern   Not on file  Social History Narrative   Not on file   Social Determinants of Health   Financial Resource Strain: Not on file  Food Insecurity: Not on file  Transportation Needs: Not on file  Physical Activity: Not on file  Stress: Not on file  Social Connections: Not on file    Allergies  Allergen Reactions   Amlodipine Rash    Outpatient Medications Prior to Visit  Medication Sig Dispense Refill   atorvastatin (LIPITOR) 20 MG tablet Take 1 tablet (20 mg total) by mouth daily. 90 tablet 1   cloNIDine (CATAPRES) 0.1 MG tablet Take 1 tablet (0.1 mg total) by mouth at bedtime. For hot flashes 30 tablet 6   hydrocortisone 2.5 % lotion Apply topically 2 (two) times daily. Apply to affected areas twice daily. 59 mL 0   hydrOXYzine (ATARAX) 25 MG tablet Take 1 tablet (25 mg total) by mouth at bedtime as needed (insomnia). 30 tablet 3   pantoprazole (PROTONIX) 40 MG tablet Take 1 tablet (40 mg total) by  mouth 2 (two) times daily before a meal. 60 tablet 1   polyethylene glycol powder (GLYCOLAX/MIRALAX) 17 GM/SCOOP powder Take 17 g by mouth daily. (Patient taking differently: Take 17 g by mouth daily as needed.) 510 g 1   triamcinolone cream (KENALOG) 0.1 % Apply 1 application. topically 2 (two) times daily. 45 g 1   zolpidem (AMBIEN) 5 MG tablet Take 1 tablet (5 mg total) by mouth at bedtime as needed for sleep. 6 tablet 0   cetirizine (ZYRTEC) 10 MG tablet TAKE 1 TABLET (10 MG TOTAL) BY MOUTH DAILY. 90 tablet 1   gabapentin (NEURONTIN) 300 MG capsule TAKE 1 CAPSULE (300 MG TOTAL) BY MOUTH AT BEDTIME. 60 capsule 6   carvedilol (COREG) 6.25 MG tablet Take 1 tablet (6.25 mg total) by mouth 2 (two) times daily with a meal. 180  tablet 0   No facility-administered medications prior to visit.     ROS Review of Systems  Constitutional:  Negative for activity change, appetite change and fatigue.  HENT:  Negative for congestion, sinus pressure and sore throat.   Eyes:  Negative for visual disturbance.  Respiratory:  Negative for cough, chest tightness, shortness of breath and wheezing.   Cardiovascular:  Negative for chest pain and palpitations.  Gastrointestinal:  Negative for abdominal distention, abdominal pain and constipation.  Endocrine: Negative for polydipsia.  Genitourinary:  Negative for dysuria and frequency.  Musculoskeletal:        See HPI  Skin:  Negative for rash.  Neurological:  Negative for tremors, light-headedness and numbness.  Hematological:  Does not bruise/bleed easily.  Psychiatric/Behavioral:  Negative for agitation and behavioral problems.     Objective:  BP (!) 185/99   Pulse (!) 54   Temp 98.1 F (36.7 C) (Oral)   Ht '4\' 10"'$  (1.473 m)   Wt 113 lb (51.3 kg)   LMP 11/15/2014   SpO2 100%   BMI 23.62 kg/m      05/24/2022    8:40 AM 04/05/2022    2:36 PM 02/08/2022   11:10 AM  BP/Weight  Systolic BP 170 017 494  Diastolic BP 99 90 82  Wt. (Lbs) 113 114.6 115.2  BMI 23.62 kg/m2 23.95 kg/m2 24.08 kg/m2      Physical Exam Constitutional:      Appearance: She is well-developed.  Cardiovascular:     Rate and Rhythm: Normal rate.     Heart sounds: Normal heart sounds. No murmur heard. Pulmonary:     Effort: Pulmonary effort is normal.     Breath sounds: Normal breath sounds. No wheezing or rales.  Chest:     Chest wall: No tenderness.  Abdominal:     General: Bowel sounds are normal. There is no distension.     Palpations: Abdomen is soft. There is no mass.     Tenderness: There is no abdominal tenderness.  Musculoskeletal:        General: Normal range of motion.     Right lower leg: No edema.     Left lower leg: No edema.     Comments: Soft fluctuant swelling in  popliteal fossa, nontender No tenderness on palpation of lumbar spine.  Negative straight leg raise bilaterally  Neurological:     Mental Status: She is alert and oriented to person, place, and time.     Motor: No weakness.     Gait: Gait normal.     Deep Tendon Reflexes: Reflexes normal.  Psychiatric:        Mood  and Affect: Mood normal.        Latest Ref Rng & Units 04/05/2022    3:34 PM 11/08/2021   11:15 AM 07/06/2021    6:05 PM  CMP  Glucose 70 - 99 mg/dL 82  87  133   BUN 6 - 23 mg/dL '8  10  10   '$ Creatinine 0.40 - 1.20 mg/dL 0.65  0.82  0.84   Sodium 135 - 145 mEq/L 142  141  138   Potassium 3.5 - 5.1 mEq/L 3.8  3.9  4.1   Chloride 96 - 112 mEq/L 108  105  108   CO2 19 - 32 mEq/L '25  22  23   '$ Calcium 8.4 - 10.5 mg/dL 9.3  9.0  9.2   Total Protein 6.0 - 8.3 g/dL 7.0  6.7  7.1   Total Bilirubin 0.2 - 1.2 mg/dL 2.1  0.9  0.7   Alkaline Phos 39 - 117 U/L 116  152  112   AST 0 - 37 U/L '19  31  26   '$ ALT 0 - 35 U/L '11  25  23     '$ Lipid Panel     Component Value Date/Time   CHOL 200 (H) 11/08/2021 1115   TRIG 265 (H) 11/08/2021 1115   HDL 32 (L) 11/08/2021 1115   CHOLHDL 6.3 (H) 11/08/2021 1115   CHOLHDL 4.8 10/26/2016 0933   VLDL 22 10/26/2016 0933   LDLCALC 121 (H) 11/08/2021 1115    CBC    Component Value Date/Time   WBC 6.8 04/18/2022 0951   RBC 2.60 (L) 04/18/2022 0951   HGB 10.4 (L) 04/18/2022 0951   HGB 11.2 05/30/2017 0903   HCT 29.1 (L) 04/18/2022 0951   HCT 34.6 05/30/2017 0903   PLT 242.0 04/18/2022 0951   PLT 329 05/30/2017 0903   MCV 112.2 (H) 04/18/2022 0951   MCV 91 05/30/2017 0903   MCH 34.1 (H) 07/06/2021 1805   MCHC 35.8 04/18/2022 0951   RDW 17.6 (H) 04/18/2022 0951   RDW 14.0 05/30/2017 0903   LYMPHSABS 2.3 04/05/2022 1534   LYMPHSABS 1.9 05/30/2017 0903   MONOABS 0.4 04/05/2022 1534   EOSABS 0.2 04/05/2022 1534   EOSABS 0.2 05/30/2017 0903   BASOSABS 0.0 04/05/2022 1534   BASOSABS 0.0 05/30/2017 0903    Lab Results  Component  Value Date   HGBA1C 5.0 12/29/2015    Assessment & Plan:  1. Vitamin B12 deficiency We will commence with oral B12 replacement and if she is intrinsic factor deficient we will need to switch to injectable form of replacement - Intrinsic Factor Antibodies - vitamin B-12 (CYANOCOBALAMIN) 1000 MCG tablet; Take 1 tablet (1,000 mcg total) by mouth daily.  Dispense: 30 tablet; Refill: 3  2. Musculoskeletal back pain Advised to apply heat or ice whichever is tolerated to painful areas. Counseled on evidence of improvement in pain control with regards to yoga, water aerobics, massage, home physical therapy, exercise as tolerated.  - diclofenac Sodium (VOLTAREN) 1 % GEL; Apply 4 g topically 4 (four) times daily.  Dispense: 100 g; Refill: 1 - tiZANidine (ZANAFLEX) 4 MG tablet; Take 1 tablet (4 mg total) by mouth every 8 (eight) hours as needed for muscle spasms.  Dispense: 60 tablet; Refill: 1  3. Synovial cyst of left popliteal space Likely Baker's cyst She would like this evaluated by orthopedics - AMB referral to orthopedics  4. Encounter for screening mammogram for malignant neoplasm of breast  - MM DIGITAL SCREENING BILATERAL;  Future  5. Essential hypertension, benign Uncontrolled due to the fact that she has run out of her antihypertensives which I have refilled Reassess at next visit Counseled on blood pressure goal of less than 130/80, low-sodium, DASH diet, medication compliance, 150 minutes of moderate intensity exercise per week. Discussed medication compliance, adverse effects. - carvedilol (COREG) 6.25 MG tablet; Take 1 tablet (6.25 mg total) by mouth 2 (two) times daily with a meal.  Dispense: 180 tablet; Refill: 0  6. Screening for diabetes mellitus - Hemoglobin A1c  7. Dyslipidemia Uncontrolled We will check lipid panel and adjust regimen accordingly - LP+Non-HDL Cholesterol    Meds ordered this encounter  Medications   vitamin B-12 (CYANOCOBALAMIN) 1000 MCG tablet     Sig: Take 1 tablet (1,000 mcg total) by mouth daily.    Dispense:  30 tablet    Refill:  3   diclofenac Sodium (VOLTAREN) 1 % GEL    Sig: Apply 4 g topically 4 (four) times daily.    Dispense:  100 g    Refill:  1   tiZANidine (ZANAFLEX) 4 MG tablet    Sig: Take 1 tablet (4 mg total) by mouth every 8 (eight) hours as needed for muscle spasms.    Dispense:  60 tablet    Refill:  1   carvedilol (COREG) 6.25 MG tablet    Sig: Take 1 tablet (6.25 mg total) by mouth 2 (two) times daily with a meal.    Dispense:  180 tablet    Refill:  0    Follow-up: Return in about 2 weeks (around 06/07/2022) for Blood pressure follow-up with Lurena Joiner, Medical conditions with PCP in 3 months.       Charlott Rakes, MD, FAAFP. [facility] and [place], [place]   05/24/2022, 1:46 PM

## 2022-05-28 ENCOUNTER — Other Ambulatory Visit: Payer: Self-pay | Admitting: Family Medicine

## 2022-05-28 LAB — LP+NON-HDL CHOLESTEROL
Cholesterol, Total: 195 mg/dL (ref 100–199)
HDL: 42 mg/dL (ref >39)
LDL Chol Calc (NIH): 129 mg/dL — ABNORMAL HIGH (ref 0–99)
Total Non-HDL-Chol (LDL+VLDL): 153 mg/dL — ABNORMAL HIGH (ref 0–129)
Triglycerides: 134 mg/dL (ref 0–149)
VLDL Cholesterol Cal: 24 mg/dL (ref 5–40)

## 2022-05-28 LAB — HEMOGLOBIN A1C
Est. average glucose Bld gHb Est-mCnc: 77 mg/dL
Hgb A1c MFr Bld: 4.3 % — ABNORMAL LOW (ref 4.8–5.6)

## 2022-05-28 LAB — INTRINSIC FACTOR ANTIBODIES: Intrinsic Factor Abs, Serum: 1.1 AU/mL (ref 0.0–1.1)

## 2022-06-25 ENCOUNTER — Other Ambulatory Visit: Payer: Self-pay

## 2022-06-26 ENCOUNTER — Other Ambulatory Visit: Payer: Self-pay

## 2022-06-26 ENCOUNTER — Ambulatory Visit: Payer: Medicaid Other | Attending: Family Medicine | Admitting: Pharmacist

## 2022-06-26 VITALS — BP 159/89 | HR 43

## 2022-06-26 DIAGNOSIS — I10 Essential (primary) hypertension: Secondary | ICD-10-CM

## 2022-06-26 MED ORDER — HYDROCHLOROTHIAZIDE 12.5 MG PO CAPS
12.5000 mg | ORAL_CAPSULE | Freq: Every day | ORAL | 2 refills | Status: DC
  Filled 2022-06-26: qty 30, 30d supply, fill #0
  Filled 2022-07-26: qty 30, 30d supply, fill #1
  Filled 2022-08-28: qty 30, 30d supply, fill #2

## 2022-06-26 NOTE — Progress Notes (Signed)
S:     No chief complaint on file.  Alice Woodard is a 51 y.o. female who presents for hypertension evaluation, education, and management.  PMH is significant for HTN, dyslipidemia.  Patient was referred and last seen by Primary Care Provider, Dr. Margarita Rana, on 05/24/2022.   At last visit, BP was elevated 185/99 as she ran out of her BP medications.   Today, patient arrives in good spirits and presents without assistance. Denies dizziness, headache, blurred vision, swelling.   Family/Social history:  Fhx: Leukemia Tobacco: never smoked Alcohol: none reported    Medication adherence is suboptimal. Patient presents today with her daughter and neither are able to verbalize what the patient should be taking for hypertension. Additionally, her fill history at our pharmacy here suggests non-adherence. Patient has taken BP medications today.   Current antihypertensives include: carvedilol 6.25 mg BID   Antihypertensives tried in the past include: Losartan 100 mg once daily, spironolactone 25 mg once daily, Lisinopril/HCTZ 20-12.5 mg once daily (facial swelling)   Patient has not been taking BP at home, and does not have BP cuff at home.  Patient reported dietary habits: - Reports adherence to sodium restrictions (follows a vegetarian diet) - Denies caffeine intake  Patient-reported exercise habits:  - 10-15 minutes daily walking  O:  Vitals:   06/26/22 1111  BP: (!) 159/89  Pulse: (!) 43    Last 3 Office BP readings: BP Readings from Last 3 Encounters:  06/26/22 (!) 159/89  05/24/22 (!) 185/99  04/05/22 (!) 160/90    BMET    Component Value Date/Time   NA 142 04/05/2022 1534   NA 141 11/08/2021 1115   K 3.8 04/05/2022 1534   CL 108 04/05/2022 1534   CO2 25 04/05/2022 1534   GLUCOSE 82 04/05/2022 1534   BUN 8 04/05/2022 1534   BUN 10 11/08/2021 1115   CREATININE 0.65 04/05/2022 1534   CREATININE 0.79 01/25/2017 0916   CALCIUM 9.3 04/05/2022 1534   GFRNONAA >60  07/06/2021 1805   GFRNONAA 89 01/25/2017 0916   GFRAA 100 07/13/2020 1129   GFRAA >89 01/25/2017 0916    Renal function: CrCl cannot be calculated (Patient's most recent lab result is older than the maximum 21 days allowed.).  Clinical ASCVD: No  The 10-year ASCVD risk score (Arnett DK, et al., 2019) is: 4.4%   Values used to calculate the score:     Age: 27 years     Sex: Female     Is Non-Hispanic African American: No     Diabetic: No     Tobacco smoker: No     Systolic Blood Pressure: 834 mmHg     Is BP treated: Yes     HDL Cholesterol: 42 mg/dL     Total Cholesterol: 195 mg/dL  A/P: Hypertension longstanding currently above goal on current medications. BP goal < 130/80 mmHg. Medication adherence appears suboptimal. - Started HCTZ 12.5 mg once daily - Continued Carvedilol 6.25 BID.  -Patient educated on purpose, proper use, and potential adverse effects of HCTZ.  -F/u labs ordered - none -Counseled on lifestyle modifications for blood pressure control including reduced dietary sodium, increased exercise, adequate sleep. -Encouraged patient to check BP at home and bring log of readings to next visit. Counseled on proper use of home BP cuff.    Results reviewed and written information provided.    Written patient instructions provided. Patient verbalized understanding of treatment plan.  Total time in face to face counseling 30 minutes.  Follow-up:  Pharmacist in 2-3 weeks.  Patient seen with: Deirdre Evener, PharmD Candidate  [employer] 2025  Pharmacist: Benard Halsted, PharmD, [place], [place] [phone]

## 2022-07-18 NOTE — Progress Notes (Deleted)
   S:     No chief complaint on file.  Alice Woodard is a 51 y.o. female who presents for hypertension evaluation, education, and management.  PMH is significant for HTN and HLD. Medication allergy listed to amlodipine with a reaction of rash listed. Patient was referred and last seen by Primary Care Provider, Dr. ***, on ***.  *** Patient was referred by *** on ***. Patient was last seen by Primary Care Provider, Dr. ***, on ***.   At last visit, ***.   Today, patient arrives in *** spirits and presents without *** assistance. *** Denies dizziness, headache, blurred vision, swelling.   Patient reports hypertension was diagnosed in ***.   Family/Social history: ***  Medication adherence *** . Patient has *** taken BP medications today.   Current antihypertensives include: carvedilol 6.'25mg'$  BID, clonidine 0.'1mg'$  once daily, and  HCTZ 12.'5mg'$  once daily  Antihypertensives tried in the past include: losartan, losartan/hctz  Reported home BP readings: ***  Patient reported dietary habits: Eats *** meals/day Breakfast: *** Lunch: *** Dinner: *** Snacks: *** Drinks: ***  Patient-reported exercise habits: ***  ASCVD risk factors include: ***  O:  ROS  Physical Exam  Last 3 Office BP readings: BP Readings from Last 3 Encounters:  06/26/22 (!) 159/89  05/24/22 (!) 185/99  04/05/22 (!) 160/90    BMET    Component Value Date/Time   NA 142 04/05/2022 1534   NA 141 11/08/2021 1115   K 3.8 04/05/2022 1534   CL 108 04/05/2022 1534   CO2 25 04/05/2022 1534   GLUCOSE 82 04/05/2022 1534   BUN 8 04/05/2022 1534   BUN 10 11/08/2021 1115   CREATININE 0.65 04/05/2022 1534   CREATININE 0.79 01/25/2017 0916   CALCIUM 9.3 04/05/2022 1534   GFRNONAA >60 07/06/2021 1805   GFRNONAA 89 01/25/2017 0916   GFRAA 100 07/13/2020 1129   GFRAA >89 01/25/2017 0916    Renal function: CrCl cannot be calculated (Patient's most recent lab result is older than the maximum 21 days  allowed.).  Clinical ASCVD: {YES/NO:21197} The 10-year ASCVD risk score (Arnett DK, et al., 2019) is: 4.4%   Values used to calculate the score:     Age: 13 years     Sex: Female     Is Non-Hispanic African American: No     Diabetic: No     Tobacco smoker: No     Systolic Blood Pressure: 425 mmHg     Is BP treated: Yes     HDL Cholesterol: 42 mg/dL     Total Cholesterol: 195 mg/dL  Patient is participating in a Managed Medicaid Plan:  {MM YES/NO:27447::"Yes"}    A/P: Hypertension diagnosed *** currently *** on current medications. BP goal < 130/80 *** mmHg. Medication adherence appears ***. Control is suboptimal due to ***.  -{Meds adjust:18428} ***.  -Patient educated on purpose, proper use, and potential adverse effects of ***.  -F/u labs ordered - *** -Counseled on lifestyle modifications for blood pressure control including reduced dietary sodium, increased exercise, adequate sleep. -Encouraged patient to check BP at home and bring log of readings to next visit. Counseled on proper use of home BP cuff.    Results reviewed and written information provided.    Written patient instructions provided. Patient verbalized understanding of treatment plan.  Total time in face to face counseling *** minutes.    Follow-up:  Pharmacist ***. PCP clinic visit in ***.  Patient seen with ***.

## 2022-07-19 ENCOUNTER — Ambulatory Visit: Payer: Medicaid Other | Attending: Family Medicine | Admitting: Pharmacist

## 2022-07-19 VITALS — BP 134/83 | HR 46

## 2022-07-19 DIAGNOSIS — I10 Essential (primary) hypertension: Secondary | ICD-10-CM

## 2022-07-19 NOTE — Progress Notes (Signed)
S:     No chief complaint on file.  Alice Woodard is a 51 y.o. female who presents for hypertension evaluation, education, and management.  PMH is significant for HTN, dyslipidemia.  Patient was referred and last seen by Primary Care Provider, Dr. Margarita Rana, on 05/24/2022.   At last visit 06/26/2022, BP was elevated 159/89 and HCTZ 12.5 mg was added.   Today, patient arrives in good spirits and presents without assistance. Denies dizziness, headache, blurred vision, swelling.   Family/Social history:  Fhx: Leukemia Tobacco: never smoked Alcohol: none reported    Medication adherence is appropriate. Patient has taken BP medications today.   Current antihypertensives include: carvedilol 6.25 mg BID, HCTZ 12.5 mg daily   Antihypertensives tried in the past include: Losartan 100 mg once daily, spironolactone 25 mg once daily, Lisinopril/HCTZ 20-12.5 mg once daily (facial swelling)   Patient has not been taking BP at home, and does not have BP cuff at home.  Patient reported dietary habits: -Reports adherence to sodium restrictions (follows a vegetarian diet) -Denies caffeine intake  Patient-reported exercise habits:  -10-15 minutes daily walking  O:  Vitals:   07/19/22 0950  BP: 134/83  Pulse: (!) 46   Last 3 Office BP readings: BP Readings from Last 3 Encounters:  07/19/22 134/83  06/26/22 (!) 159/89  05/24/22 (!) 185/99   BMET    Component Value Date/Time   NA 142 04/05/2022 1534   NA 141 11/08/2021 1115   K 3.8 04/05/2022 1534   CL 108 04/05/2022 1534   CO2 25 04/05/2022 1534   GLUCOSE 82 04/05/2022 1534   BUN 8 04/05/2022 1534   BUN 10 11/08/2021 1115   CREATININE 0.65 04/05/2022 1534   CREATININE 0.79 01/25/2017 0916   CALCIUM 9.3 04/05/2022 1534   GFRNONAA >60 07/06/2021 1805   GFRNONAA 89 01/25/2017 0916   GFRAA 100 07/13/2020 1129   GFRAA >89 01/25/2017 0916   Renal function: CrCl cannot be calculated (Patient's most recent lab result is older  than the maximum 21 days allowed.).  Clinical ASCVD: No  The 10-year ASCVD risk score (Arnett DK, et al., 2019) is: 3.1%   Values used to calculate the score:     Age: 42 years     Sex: Female     Is Non-Hispanic African American: No     Diabetic: No     Tobacco smoker: No     Systolic Blood Pressure: 622 mmHg     Is BP treated: Yes     HDL Cholesterol: 42 mg/dL     Total Cholesterol: 195 mg/dL  A/P: Hypertension longstanding currently close to goal on current medications. BP goal < 130/80 mmHg. Medication adherence appears appropriate. - Continued HCTZ 12.5 mg once daily. Recommend increasing HCTZ to 25 mg if not at goal next visit.  - Continued Carvedilol 6.25 BID.  -F/u labs ordered - BMP -Counseled on lifestyle modifications for blood pressure control including reduced dietary sodium, increased exercise, adequate sleep. -Encouraged patient to check BP at home and bring log of readings to next visit. Counseled on proper use of home BP cuff.   Results reviewed and written information provided.    Written patient instructions provided. Patient verbalized understanding of treatment plan.  Total time in face to face counseling 30 minutes.    Follow-up:  -Follow up with PCP in October.  Patient seen with: Deirdre Evener, PharmD Candidate  [employer] 2025  Pharmacist: Benard Halsted, PharmD, BCACP, CPP Clinical Pharmacist  [place] [phone]

## 2022-07-20 LAB — BASIC METABOLIC PANEL
BUN/Creatinine Ratio: 10 (ref 9–23)
BUN: 7 mg/dL (ref 6–24)
CO2: 22 mmol/L (ref 20–29)
Calcium: 9 mg/dL (ref 8.7–10.2)
Chloride: 101 mmol/L (ref 96–106)
Creatinine, Ser: 0.68 mg/dL (ref 0.57–1.00)
Glucose: 88 mg/dL (ref 70–99)
Potassium: 3.4 mmol/L — ABNORMAL LOW (ref 3.5–5.2)
Sodium: 140 mmol/L (ref 134–144)
eGFR: 104 mL/min/{1.73_m2} (ref >59)

## 2022-07-26 ENCOUNTER — Other Ambulatory Visit: Payer: Self-pay

## 2022-08-27 ENCOUNTER — Ambulatory Visit: Payer: Medicaid Other | Admitting: Family Medicine

## 2022-08-28 ENCOUNTER — Other Ambulatory Visit: Payer: Self-pay

## 2022-09-18 ENCOUNTER — Ambulatory Visit: Payer: Medicaid Other | Attending: Family Medicine | Admitting: Family Medicine

## 2022-09-18 ENCOUNTER — Other Ambulatory Visit: Payer: Self-pay

## 2022-09-18 ENCOUNTER — Encounter: Payer: Self-pay | Admitting: Family Medicine

## 2022-09-18 VITALS — BP 147/96 | HR 51 | Temp 98.7°F | Ht <= 58 in | Wt 115.6 lb

## 2022-09-18 DIAGNOSIS — I10 Essential (primary) hypertension: Secondary | ICD-10-CM

## 2022-09-18 DIAGNOSIS — E876 Hypokalemia: Secondary | ICD-10-CM

## 2022-09-18 DIAGNOSIS — K2970 Gastritis, unspecified, without bleeding: Secondary | ICD-10-CM

## 2022-09-18 DIAGNOSIS — K2990 Gastroduodenitis, unspecified, without bleeding: Secondary | ICD-10-CM

## 2022-09-18 DIAGNOSIS — M79671 Pain in right foot: Secondary | ICD-10-CM

## 2022-09-18 DIAGNOSIS — G4709 Other insomnia: Secondary | ICD-10-CM

## 2022-09-18 DIAGNOSIS — Z23 Encounter for immunization: Secondary | ICD-10-CM

## 2022-09-18 DIAGNOSIS — E538 Deficiency of other specified B group vitamins: Secondary | ICD-10-CM

## 2022-09-18 MED ORDER — CHLORTHALIDONE 25 MG PO TABS
25.0000 mg | ORAL_TABLET | Freq: Every day | ORAL | 1 refills | Status: DC
  Filled 2022-09-18: qty 30, 30d supply, fill #0
  Filled 2022-10-16: qty 30, 30d supply, fill #1

## 2022-09-18 MED ORDER — TRAZODONE HCL 50 MG PO TABS
50.0000 mg | ORAL_TABLET | Freq: Every evening | ORAL | 3 refills | Status: AC | PRN
  Filled 2022-09-18: qty 30, 30d supply, fill #0
  Filled 2022-10-30: qty 30, 30d supply, fill #1

## 2022-09-18 MED ORDER — DICLOFENAC SODIUM 1 % EX GEL
4.0000 g | Freq: Four times a day (QID) | CUTANEOUS | 1 refills | Status: DC
  Filled 2022-09-18: qty 100, 7d supply, fill #0

## 2022-09-18 NOTE — Patient Instructions (Addendum)
DASH Eating Plan DASH stands for Dietary Approaches to Stop Hypertension. The DASH eating plan is a healthy eating plan that has been shown to: Reduce high blood pressure (hypertension). Reduce your risk for type 2 diabetes, heart disease, and stroke. Help with weight loss. What are tips for following this plan? Reading food labels Check food labels for the amount of salt (sodium) per serving. Choose foods with less than 5 percent of the Daily Value of sodium. Generally, foods with less than 300 milligrams (mg) of sodium per serving fit into this eating plan. To find whole grains, look for the word "whole" as the first word in the ingredient list. Shopping Buy products labeled as "low-sodium" or "no salt added." Buy fresh foods. Avoid canned foods and pre-made or frozen meals. Cooking Avoid adding salt when cooking. Use salt-free seasonings or herbs instead of table salt or sea salt. Check with your health care provider or pharmacist before using salt substitutes. Do not fry foods. Cook foods using healthy methods such as baking, boiling, grilling, roasting, and broiling instead. Cook with heart-healthy oils, such as olive, canola, avocado, soybean, or sunflower oil. Meal planning  Eat a balanced diet that includes: 4 or more servings of fruits and 4 or more servings of vegetables each day. Try to fill one-half of your plate with fruits and vegetables. 6-8 servings of whole grains each day. Less than 6 oz (170 g) of lean meat, poultry, or fish each day. A 3-oz (85-g) serving of meat is about the same size as a deck of cards. One egg equals 1 oz (28 g). 2-3 servings of low-fat dairy each day. One serving is 1 cup (237 mL). 1 serving of nuts, seeds, or beans 5 times each week. 2-3 servings of heart-healthy fats. Healthy fats called omega-3 fatty acids are found in foods such as walnuts, flaxseeds, fortified milks, and eggs. These fats are also found in cold-water fish, such as sardines, salmon,  and mackerel. Limit how much you eat of: Canned or prepackaged foods. Food that is high in trans fat, such as some fried foods. Food that is high in saturated fat, such as fatty meat. Desserts and other sweets, sugary drinks, and other foods with added sugar. Full-fat dairy products. Do not salt foods before eating. Do not eat more than 4 egg yolks a week. Try to eat at least 2 vegetarian meals a week. Eat more home-cooked food and less restaurant, buffet, and fast food. Lifestyle When eating at a restaurant, ask that your food be prepared with less salt or no salt, if possible. If you drink alcohol: Limit how much you use to: 0-1 drink a day for women who are not pregnant. 0-2 drinks a day for men. Be aware of how much alcohol is in your drink. In the [place] one drink equals one 12 oz bottle of beer (355 mL), one 5 oz glass of wine (148 mL), or one 1 oz glass of hard liquor (44 mL). General information Avoid eating more than 2,300 mg of salt a day. If you have hypertension, you may need to reduce your sodium intake to 1,500 mg a day. Work with your health care provider to maintain a healthy body weight or to lose weight. Ask what an ideal weight is for you. Get at least 30 minutes of exercise that causes your heart to beat faster (aerobic exercise) most days of the week. Activities may include walking, swimming, or biking. Work with your health care provider or dietitian to   adjust your eating plan to your individual calorie needs. What foods should I eat? Fruits All fresh, dried, or frozen fruit. Canned fruit in natural juice (without added sugar). Vegetables Fresh or frozen vegetables (raw, steamed, roasted, or grilled). Low-sodium or reduced-sodium tomato and vegetable juice. Low-sodium or reduced-sodium tomato sauce and tomato paste. Low-sodium or reduced-sodium canned vegetables. Grains Whole-grain or whole-wheat bread. Whole-grain or whole-wheat pasta. Brown rice. Oatmeal. Quinoa.  Bulgur. Whole-grain and low-sodium cereals. Pita bread. Low-fat, low-sodium crackers. Whole-wheat flour tortillas. Meats and other proteins Skinless chicken or turkey. Ground chicken or turkey. Pork with fat trimmed off. Fish and seafood. Egg whites. Dried beans, peas, or lentils. Unsalted nuts, nut butters, and seeds. Unsalted canned beans. Lean cuts of beef with fat trimmed off. Low-sodium, lean precooked or cured meat, such as sausages or meat loaves. Dairy Low-fat (1%) or fat-free (skim) milk. Reduced-fat, low-fat, or fat-free cheeses. Nonfat, low-sodium ricotta or cottage cheese. Low-fat or nonfat yogurt. Low-fat, low-sodium cheese. Fats and oils Soft margarine without trans fats. Vegetable oil. Reduced-fat, low-fat, or light mayonnaise and salad dressings (reduced-sodium). Canola, safflower, olive, avocado, soybean, and sunflower oils. Avocado. Seasonings and condiments Herbs. Spices. Seasoning mixes without salt. Other foods Unsalted popcorn and pretzels. Fat-free sweets. The items listed above may not be a complete list of foods and beverages you can eat. Contact a dietitian for more information. What foods should I avoid? Fruits Canned fruit in a light or heavy syrup. Fried fruit. Fruit in cream or butter sauce. Vegetables Creamed or fried vegetables. Vegetables in a cheese sauce. Regular canned vegetables (not low-sodium or reduced-sodium). Regular canned tomato sauce and paste (not low-sodium or reduced-sodium). Regular tomato and vegetable juice (not low-sodium or reduced-sodium). Pickles. Olives. Grains Baked goods made with fat, such as croissants, muffins, or some breads. Dry pasta or rice meal packs. Meats and other proteins Fatty cuts of meat. Ribs. Fried meat. Bacon. Bologna, salami, and other precooked or cured meats, such as sausages or meat loaves. Fat from the back of a pig (fatback). Bratwurst. Salted nuts and seeds. Canned beans with added salt. Canned or smoked fish.  Whole eggs or egg yolks. Chicken or turkey with skin. Dairy Whole or 2% milk, cream, and half-and-half. Whole or full-fat cream cheese. Whole-fat or sweetened yogurt. Full-fat cheese. Nondairy creamers. Whipped toppings. Processed cheese and cheese spreads. Fats and oils Butter. Stick margarine. Lard. Shortening. Ghee. Bacon fat. Tropical oils, such as coconut, palm kernel, or palm oil. Seasonings and condiments Onion salt, garlic salt, seasoned salt, table salt, and sea salt. Worcestershire sauce. Tartar sauce. Barbecue sauce. Teriyaki sauce. Soy sauce, including reduced-sodium. Steak sauce. Canned and packaged gravies. Fish sauce. Oyster sauce. Cocktail sauce. Store-bought horseradish. Ketchup. Mustard. Meat flavorings and tenderizers. Bouillon cubes. Hot sauces. Pre-made or packaged marinades. Pre-made or packaged taco seasonings. Relishes. Regular salad dressings. Other foods Salted popcorn and pretzels. The items listed above may not be a complete list of foods and beverages you should avoid. Contact a dietitian for more information. Where to find more information National Heart, Lung, and Blood Institute: www.nhlbi.nih.gov American Heart Association: www.heart.org Academy of Nutrition and Dietetics: www.eatright.org National Kidney Foundation: www.kidney.org Summary The DASH eating plan is a healthy eating plan that has been shown to reduce high blood pressure (hypertension). It may also reduce your risk for type 2 diabetes, heart disease, and stroke. When on the DASH eating plan, aim to eat more fresh fruits and vegetables, whole grains, lean proteins, low-fat dairy, and heart-healthy fats. With the DASH   eating plan, you should limit salt (sodium) intake to 2,300 mg a day. If you have hypertension, you may need to reduce your sodium intake to 1,500 mg a day. Work with your health care provider or dietitian to adjust your eating plan to your individual calorie needs. This information is not  intended to replace advice given to you by your health care provider. Make sure you discuss any questions you have with your health care provider. Document Revised: 10/02/2019 Document Reviewed: 10/02/2019 Elsevier Patient Education  [place]. Influenza (Flu) Vaccine (Inactivated or Recombinant): What You Need to Know 1. Why get vaccinated? Influenza vaccine can prevent influenza (flu). Flu is a contagious disease that spreads around the [place] every year, usually between October and May. Anyone can get the flu, but it is more dangerous for some people. Infants and young children, people 36 years and older, pregnant people, and people with certain health conditions or a weakened immune system are at greatest risk of flu complications. Pneumonia, bronchitis, sinus infections, and ear infections are examples of flu-related complications. If you have a medical condition, such as heart disease, cancer, or diabetes, flu can make it worse. Flu can cause fever and chills, sore throat, muscle aches, fatigue, cough, headache, and runny or stuffy nose. Some people may have vomiting and diarrhea, though this is more common in children than adults. In an average year, thousands of people in the [place] die from flu, and many more are hospitalized. Flu vaccine prevents millions of illnesses and flu-related visits to the doctor each year. 2. Influenza vaccines CDC recommends everyone 6 months and older get vaccinated every flu season. Children 6 months through 63 years of age may need 2 doses during a single flu season. Everyone else needs only 1 dose each flu season. It takes about 2 weeks for protection to develop after vaccination. There are many flu viruses, and they are always changing. Each year a new flu vaccine is made to protect against the influenza viruses believed to be likely to cause disease in the upcoming flu season. Even when the vaccine doesn't exactly match these viruses, it  may still provide some protection. Influenza vaccine does not cause flu. Influenza vaccine may be given at the same time as other vaccines. 3. Talk with your health care provider Tell your vaccination provider if the person getting the vaccine: Has had an allergic reaction after a previous dose of influenza vaccine, or has any severe, life-threatening allergies Has ever had Guillain-Barr Syndrome (also called "GBS") In some cases, your health care provider may decide to postpone influenza vaccination until a future visit. Influenza vaccine can be administered at any time during pregnancy. People who are or will be pregnant during influenza season should receive inactivated influenza vaccine. People with minor illnesses, such as a cold, may be vaccinated. People who are moderately or severely ill should usually wait until they recover before getting influenza vaccine. Your health care provider can give you more information. 4. Risks of a vaccine reaction Soreness, redness, and swelling where the shot is given, fever, muscle aches, and headache can happen after influenza vaccination. There may be a very small increased risk of Guillain-Barr Syndrome (GBS) after inactivated influenza vaccine (the flu shot). Young children who get the flu shot along with pneumococcal vaccine (PCV13) and/or DTaP vaccine at the same time might be slightly more likely to have a seizure caused by fever. Tell your health care provider if a child who is getting flu  vaccine has ever had a seizure. People sometimes faint after medical procedures, including vaccination. Tell your provider if you feel dizzy or have vision changes or ringing in the ears. As with any medicine, there is a very remote chance of a vaccine causing a severe allergic reaction, other serious injury, or death. 5. What if there is a serious problem? An allergic reaction could occur after the vaccinated person leaves the clinic. If you see signs of a  severe allergic reaction (hives, swelling of the face and throat, difficulty breathing, a fast heartbeat, dizziness, or weakness), call 9-1-1 and get the person to the nearest hospital. For other signs that concern you, call your health care provider. Adverse reactions should be reported to the Vaccine Adverse Event Reporting System (VAERS). Your health care provider will usually file this report, or you can do it yourself. Visit the VAERS website at www.vaers.SamedayNews.es or call (938)405-7638. VAERS is only for reporting reactions, and VAERS staff members do not give medical advice. 6. The National Vaccine Injury Compensation Program The [employer] Vaccine Injury Compensation Program (VICP) is a federal program that was created to compensate people who may have been injured by certain vaccines. Claims regarding alleged injury or death due to vaccination have a time limit for filing, which may be as short as two years. Visit the VICP website at GoldCloset.com.ee or call (782) 221-8756 to learn about the program and about filing a claim. 7. How can I learn more? Ask your health care provider. Call your local or state health department. Visit the website of the Food and Drug Administration (FDA) for vaccine package inserts and additional information at TraderRating.uy. Contact the Centers for Disease Control and Prevention (CDC): Call 587-737-6878 (1-800-CDC-INFO) or Visit CDC's website at https://gibson.com/. Source: CDC Vaccine Information Statement Inactivated Influenza Vaccine (06/17/2020) This same material is available at http://www.wolf.info/ for no charge. This information is not intended to replace advice given to you by your health care provider. Make sure you discuss any questions you have with your health care provider. Document Revised: 09/26/2021 Document Reviewed: 07/20/2021 Elsevier Patient Education  [place].

## 2022-09-18 NOTE — Progress Notes (Signed)
Subjective:  Patient ID: Alice Woodard, female    DOB: Mar 16, 1971  Age: 51 y.o. MRN: [id number]  CC: Hypertension   HPI Alice Woodard is a 51 y.o. 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  Seen with the aid of Nepali video interpreter and is accompanied by her daughter today.  Interval History: She had a visit with the clinical pharmacist at which time her blood pressure was 134/83 but it is slightly elevated today.  Endorses adherence with her antihypertensive.  Her PPI dose of '40mg'$  bid has provided relief of her epigastric pain compared to when she was on a daily dosing. Was placed on vitamin B12 replacement due to vitamin B-12 deficiency at her last office visit.  Interested factor was negative.  She complains of insomnia and would like a Prescription for this. Denies intake of caffeinated products and she does not take daytime naps. At times she has pain in the sole of her right foot which is worse at night.  Pain is absent at this time. Past Medical History:  Diagnosis Date   Anemia    History of blood transfusion 04/17/2017   "anemic"   Hyperlipidemia    Hypertension     Past Surgical History:  Procedure Laterality Date   APPENDECTOMY     CHOLECYSTECTOMY OPEN     ESOPHAGOGASTRODUODENOSCOPY N/A 04/18/2017   Procedure: ESOPHAGOGASTRODUODENOSCOPY (EGD);  Surgeon: Mauri Pole, MD;  Location: [facility] ENDOSCOPY;  Service: Endoscopy;  Laterality: N/A;   UPPER GASTROINTESTINAL ENDOSCOPY  04/2017    Family History  Problem Relation Age of Onset   Cancer - Other Mother        blood   Colon cancer Neg Hx    Esophageal cancer Neg Hx    Rectal cancer Neg Hx    Stomach cancer Neg Hx     Social History   Socioeconomic History   Marital status: Single    Spouse name: Not on file   Number of children: 2    Years of education: Not on file   Highest education level: Not on file  Occupational History   Not on file  Tobacco Use   Smoking status: Never   Smokeless tobacco: Never  Vaping Use   Vaping Use: Never used  Substance and Sexual Activity   Alcohol use: No   Drug use: No   Sexual activity: Not on file  Other Topics Concern   Not on file  Social History Narrative   Not on file   Social Determinants of Health   Financial Resource Strain: Not on file  Food Insecurity: Not on file  Transportation Needs: Not on file  Physical Activity: Not on file  Stress: Not on file  Social Connections: Not on file    Allergies  Allergen Reactions   Amlodipine Rash    Outpatient Medications Prior to Visit  Medication Sig Dispense Refill   atorvastatin (LIPITOR) 20 MG tablet Take 1 tablet (20 mg total) by mouth daily. 90 tablet 1   carvedilol (COREG) 6.25 MG tablet Take 1 tablet (6.25 mg total) by mouth 2 (two) times daily with a meal. 180 tablet 1   cloNIDine (CATAPRES) 0.1 MG tablet Take 1 tablet (0.1 mg total) by mouth at bedtime. For hot flashes 30 tablet 6   hydrocortisone 2.5 % lotion Apply topically 2 (two) times daily. Apply to affected areas twice  daily. 59 mL 0   hydrOXYzine (ATARAX) 25 MG tablet Take 1 tablet (25 mg total) by mouth at bedtime as needed (insomnia). 30 tablet 3   pantoprazole (PROTONIX) 40 MG tablet Take 1 tablet (40 mg total) by mouth 2 (two) times daily before a meal. 60 tablet 1   polyethylene glycol powder (GLYCOLAX/MIRALAX) 17 GM/SCOOP powder Take 17 g by mouth daily. (Patient taking differently: Take 17 g by mouth daily as needed.) 510 g 1   tiZANidine (ZANAFLEX) 4 MG tablet Take 1 tablet (4 mg total) by mouth every 8 (eight) hours as needed for muscle spasms. 60 tablet 1   triamcinolone cream (KENALOG) 0.1 % Apply 1 application. topically 2 (two) times daily. 45 g 1   vitamin B-12 (CYANOCOBALAMIN) 1000 MCG tablet Take 1 tablet (1,000 mcg total) by mouth daily.  30 tablet 3   diclofenac Sodium (VOLTAREN) 1 % GEL Apply 4 g topically 4 (four) times daily. 100 g 1   hydrochlorothiazide (MICROZIDE) 12.5 MG capsule Take 1 capsule (12.5 mg total) by mouth daily. 30 capsule 2   zolpidem (AMBIEN) 5 MG tablet Take 1 tablet (5 mg total) by mouth at bedtime as needed for sleep. 6 tablet 0   cetirizine (ZYRTEC) 10 MG tablet TAKE 1 TABLET (10 MG TOTAL) BY MOUTH DAILY. 90 tablet 1   gabapentin (NEURONTIN) 300 MG capsule TAKE 1 CAPSULE (300 MG TOTAL) BY MOUTH AT BEDTIME. 60 capsule 6   No facility-administered medications prior to visit.     ROS Review of Systems  Constitutional:  Negative for activity change, appetite change and fatigue.  HENT:  Negative for congestion, sinus pressure and sore throat.   Eyes:  Negative for visual disturbance.  Respiratory:  Negative for cough, chest tightness, shortness of breath and wheezing.   Cardiovascular:  Negative for chest pain and palpitations.  Gastrointestinal:  Negative for abdominal distention, abdominal pain and constipation.  Endocrine: Negative for polydipsia.  Genitourinary:  Negative for dysuria and frequency.  Musculoskeletal:        See HPI  Skin:  Negative for rash.  Neurological:  Negative for tremors, light-headedness and numbness.  Hematological:  Does not bruise/bleed easily.  Psychiatric/Behavioral:  Positive for sleep disturbance. Negative for agitation and behavioral problems.     Objective:  BP (!) 147/96   Pulse (!) 51   Temp 98.7 F (37.1 C) (Oral)   Ht '4\' 10"'$  (1.473 m)   Wt 115 lb 9.6 oz (52.4 kg)   LMP 11/15/2014   SpO2 100%   BMI 24.16 kg/m      09/18/2022   10:04 AM 09/18/2022    9:36 AM 07/19/2022    9:50 AM  BP/Weight  Systolic BP 326 712 458  Diastolic BP 96 95 83  Wt. (Lbs)  115.6   BMI  24.16 kg/m2       Physical Exam Constitutional:      Appearance: She is well-developed.  Cardiovascular:     Rate and Rhythm: Bradycardia present.     Heart sounds: Normal  heart sounds. No murmur heard. Pulmonary:     Effort: Pulmonary effort is normal.     Breath sounds: Normal breath sounds. No wheezing or rales.  Chest:     Chest wall: No tenderness.  Abdominal:     General: Bowel sounds are normal. There is no distension.     Palpations: Abdomen is soft. There is no mass.     Tenderness: There is no abdominal tenderness.  Musculoskeletal:  General: Normal range of motion.     Right lower leg: No edema.     Left lower leg: No edema.  Neurological:     Mental Status: She is alert and oriented to person, place, and time.  Psychiatric:        Mood and Affect: Mood normal.        Latest Ref Rng & Units 07/19/2022   10:08 AM 04/05/2022    3:34 PM 11/08/2021   11:15 AM  CMP  Glucose 70 - 99 mg/dL 88  82  87   BUN 6 - 24 mg/dL '7  8  10   '$ Creatinine 0.57 - 1.00 mg/dL 0.68  0.65  0.82   Sodium 134 - 144 mmol/L 140  142  141   Potassium 3.5 - 5.2 mmol/L 3.4  3.8  3.9   Chloride 96 - 106 mmol/L 101  108  105   CO2 20 - 29 mmol/L '22  25  22   '$ Calcium 8.7 - 10.2 mg/dL 9.0  9.3  9.0   Total Protein 6.0 - 8.3 g/dL  7.0  6.7   Total Bilirubin 0.2 - 1.2 mg/dL  2.1  0.9   Alkaline Phos 39 - 117 U/L  116  152   AST 0 - 37 U/L  19  31   ALT 0 - 35 U/L  11  25     Lipid Panel     Component Value Date/Time   CHOL 195 05/24/2022 0921   TRIG 134 05/24/2022 0921   HDL 42 05/24/2022 0921   CHOLHDL 6.3 (H) 11/08/2021 1115   CHOLHDL 4.8 10/26/2016 0933   VLDL 22 10/26/2016 0933   LDLCALC 129 (H) 05/24/2022 0921    CBC    Component Value Date/Time   WBC 6.8 04/18/2022 0951   RBC 2.60 (L) 04/18/2022 0951   HGB 10.4 (L) 04/18/2022 0951   HGB 11.2 05/30/2017 0903   HCT 29.1 (L) 04/18/2022 0951   HCT 34.6 05/30/2017 0903   PLT 242.0 04/18/2022 0951   PLT 329 05/30/2017 0903   MCV 112.2 (H) 04/18/2022 0951   MCV 91 05/30/2017 0903   MCH 34.1 (H) 07/06/2021 1805   MCHC 35.8 04/18/2022 0951   RDW 17.6 (H) 04/18/2022 0951   RDW 14.0 05/30/2017  0903   LYMPHSABS 2.3 04/05/2022 1534   LYMPHSABS 1.9 05/30/2017 0903   MONOABS 0.4 04/05/2022 1534   EOSABS 0.2 04/05/2022 1534   EOSABS 0.2 05/30/2017 0903   BASOSABS 0.0 04/05/2022 1534   BASOSABS 0.0 05/30/2017 0903    Lab Results  Component Value Date   HGBA1C 4.3 (L) 05/24/2022    Assessment & Plan:  1. Gastritis and gastroduodenitis Controlled on PPI Avoid foods that trigger symptoms  Continue pantoprazole 40 mg every 12  2. Right foot pain Suspicious for plantar fasciitis Advised to obtain insoles Unable to use oral NSAIDs due to gastritis - diclofenac Sodium (VOLTAREN) 1 % GEL; Apply 4 g topically 4 (four) times daily.  Dispense: 100 g; Refill: 1  3. Vitamin B12 deficiency Last B12 level was 58 in 04/2022, intrinsic factor was negative We will check B12 level again Continue vitamin B12 replacement - Vitamin B12  4. Hypokalemia Last potassium was 3.4 Likely diuretic induced We will check potassium again - Potassium  5. Need for immunization against influenza - Flu Vaccine QUAD 55moIM (Fluarix, Fluzone & Alfiuria Quad PF)  6. Other insomnia Uncontrolled Sleep hygiene discussed Initiate trazodone - traZODone (DESYREL) 50 MG  tablet; Take 1 tablet (50 mg total) by mouth at bedtime as needed for sleep.  Dispense: 30 tablet; Refill: 3  7. Essential hypertension, benign Slightly above goal Switch from HCTZ to chlorthalidone Counseled on blood pressure goal of less than 130/80, low-sodium, DASH diet, medication compliance, 150 minutes of moderate intensity exercise per week. Discussed medication compliance, adverse effects. - chlorthalidone (HYGROTON) 25 MG tablet; Take 1 tablet (25 mg total) by mouth daily.  Dispense: 90 tablet; Refill: 1   Meds ordered this encounter  Medications   diclofenac Sodium (VOLTAREN) 1 % GEL    Sig: Apply 4 g topically 4 (four) times daily.    Dispense:  100 g    Refill:  1   traZODone (DESYREL) 50 MG tablet    Sig: Take 1  tablet (50 mg total) by mouth at bedtime as needed for sleep.    Dispense:  30 tablet    Refill:  3   chlorthalidone (HYGROTON) 25 MG tablet    Sig: Take 1 tablet (25 mg total) by mouth daily.    Dispense:  90 tablet    Refill:  1    Discontinue HCTZ    Follow-up: Return in about 3 months (around 12/19/2022) for Chronic medical conditions, 2nd dose shingrix.       Charlott Rakes, MD, FAAFP. [facility], [place]   09/18/2022, 12:33 PM

## 2022-09-19 ENCOUNTER — Other Ambulatory Visit: Payer: Self-pay | Admitting: Family Medicine

## 2022-09-19 ENCOUNTER — Other Ambulatory Visit: Payer: Self-pay

## 2022-09-19 LAB — POTASSIUM: Potassium: 3.3 mmol/L — ABNORMAL LOW (ref 3.5–5.2)

## 2022-09-19 LAB — VITAMIN B12: Vitamin B-12: 127 pg/mL — ABNORMAL LOW (ref 232–1245)

## 2022-09-19 MED ORDER — POTASSIUM CHLORIDE CRYS ER 20 MEQ PO TBCR
20.0000 meq | EXTENDED_RELEASE_TABLET | Freq: Every day | ORAL | 3 refills | Status: DC
  Filled 2022-09-19: qty 30, 30d supply, fill #0
  Filled 2022-10-29: qty 30, 30d supply, fill #1

## 2022-09-25 ENCOUNTER — Other Ambulatory Visit: Payer: Self-pay

## 2022-09-27 ENCOUNTER — Other Ambulatory Visit: Payer: Self-pay

## 2022-10-12 DIAGNOSIS — Z419 Encounter for procedure for purposes other than remedying health state, unspecified: Secondary | ICD-10-CM | POA: Diagnosis not present

## 2022-10-16 ENCOUNTER — Other Ambulatory Visit: Payer: Self-pay

## 2022-10-16 ENCOUNTER — Other Ambulatory Visit: Payer: Self-pay | Admitting: Nurse Practitioner

## 2022-10-16 MED ORDER — PANTOPRAZOLE SODIUM 40 MG PO TBEC
40.0000 mg | DELAYED_RELEASE_TABLET | Freq: Two times a day (BID) | ORAL | 1 refills | Status: DC
  Filled 2022-10-16: qty 60, 30d supply, fill #0

## 2022-10-16 NOTE — Telephone Encounter (Signed)
Please advise. Patient was last seen in 03/2022.

## 2022-10-17 ENCOUNTER — Other Ambulatory Visit: Payer: Self-pay

## 2022-10-29 ENCOUNTER — Other Ambulatory Visit: Payer: Self-pay

## 2022-10-30 ENCOUNTER — Other Ambulatory Visit: Payer: Self-pay

## 2022-10-31 DIAGNOSIS — R109 Unspecified abdominal pain: Secondary | ICD-10-CM | POA: Diagnosis not present

## 2022-11-01 DIAGNOSIS — E876 Hypokalemia: Secondary | ICD-10-CM | POA: Diagnosis not present

## 2022-11-12 DIAGNOSIS — Z419 Encounter for procedure for purposes other than remedying health state, unspecified: Secondary | ICD-10-CM | POA: Diagnosis not present

## 2022-11-19 DIAGNOSIS — H538 Other visual disturbances: Secondary | ICD-10-CM | POA: Diagnosis not present

## 2022-11-19 DIAGNOSIS — E538 Deficiency of other specified B group vitamins: Secondary | ICD-10-CM | POA: Diagnosis not present

## 2022-11-19 DIAGNOSIS — R202 Paresthesia of skin: Secondary | ICD-10-CM | POA: Diagnosis not present

## 2022-11-19 DIAGNOSIS — I10 Essential (primary) hypertension: Secondary | ICD-10-CM | POA: Diagnosis not present

## 2022-11-19 DIAGNOSIS — E876 Hypokalemia: Secondary | ICD-10-CM | POA: Diagnosis not present

## 2022-11-26 DIAGNOSIS — E538 Deficiency of other specified B group vitamins: Secondary | ICD-10-CM | POA: Diagnosis not present

## 2022-11-27 ENCOUNTER — Other Ambulatory Visit: Payer: Self-pay

## 2022-11-28 NOTE — Progress Notes (Signed)
11/28/2022 Sherina Stammer [id number] 12-Oct-1971   Chief Complaint: Abdominal pain   History of Present Illness: Morningstar Toft is a 52 year of female from [place] with a past medical history of hypertension, hyperlipidemia, vitamin B12 deficiency, hepatic steatosis and H. Pylori duodenal ulcers 2018. Past cholecystectomy. She is followed by Dr. Silverio Decamp.   She presents today for further evaluation regarding chronic upper abdominal pain with generalized abdominal bloat for the past few months. She is accompanied by her daughter. She speaks Nepali therefore a Haematologist interpreter # [phone] was utilized throughout today's visit. Her upper abdominal pain comes and goes, lasts 30 minutes to 60 minutes or sometimes all day. She describes the pain as mild and achy. Eating triggers the pain, no specific food triggers. She has nausea daily , no vomiting. She was previously prescribed Pantoprazole '40mg'$  one po QD, however, she reported taking Pantoprazole '40mg'$  two capsule po Qam. She passes a yellow formed BM once daily but for the past weeks she is passing 3 to 4 BMs daily. She does not feel constipated. No black stools. She sometimes sees a little bright red blood on the stool which last occurred 2 weeks ago. No rectal pain.  No NSAID use.  She underwent an EGD 07/27/2021 which showed non bleeding gastric ulcers and duodenal erosions. Gastric biopsies were negative for H. 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 A repeat EGD in 3 years was recommended.   She underwent a colonoscopy 05/20/2019 which showed 2 tubular adenomatous/hyperplastic polyps removed from the sigmoid and colon and 3 hyperplastic polyps were removed from the rectum.  Bleeding internal hemorrhoids were noted.  She was advised to repeat a colonoscopy in 7 years.  Abdominal sonogram 11/2019 was consistent with s/p cholecystectomy and hepatic steatosis.  She has vitamin B12 deficiency anemia, on B12  replacement therapy per her PCP.       Latest Ref Rng & Units 04/18/2022    9:51 AM 04/05/2022    3:34 PM 07/06/2021    6:05 PM  CBC  WBC 4.0 - 10.5 K/uL 6.8  7.6  10.3   Hemoglobin 12.0 - 15.0 g/dL 10.4  10.5  12.0   Hematocrit 36.0 - 46.0 % 29.1  29.8  34.5   Platelets 150.0 - 400.0 K/uL 242.0  259.0  273   MCV 112.2 Iron 105 on 04/18/2022 Vitamin B12 level 58 on 04/18/2022 and 127 09/18/2022.     Latest Ref Rng & Units 09/18/2022   10:22 AM 07/19/2022   10:08 AM 04/05/2022    3:34 PM  CMP  Glucose 70 - 99 mg/dL  88  82   BUN 6 - 24 mg/dL  7  8   Creatinine 0.57 - 1.00 mg/dL  0.68  0.65   Sodium 134 - 144 mmol/L  140  142   Potassium 3.5 - 5.2 mmol/L 3.3  3.4  3.8   Chloride 96 - 106 mmol/L  101  108   CO2 20 - 29 mmol/L  22  25   Calcium 8.7 - 10.2 mg/dL  9.0  9.3   Total Protein 6.0 - 8.3 g/dL   7.0   Total Bilirubin 0.2 - 1.2 mg/dL   2.1   Alkaline Phos 39 - 117 U/L   116   AST 0 - 37 U/L   19   ALT 0 - 35 U/L   11     Abdominal sonogram 11/21/2019: 1. Status post cholecystectomy. 2. Heterogeneous increased echotexture  of the liver consistent with fatty infiltration. No discrete mass lesion is present. 3. Poor visualization of the IVC, pancreas, and IVC.  EGD 07/27/2021: - Z-line regular, 35 cm from the incisors. - No gross lesions in esophagus. - Erythematous mucosa in the antrum and prepyloric region of the stomach. Biopsied. - Non-bleeding gastric ulcers with no stigmata of bleeding. Biopsied. - Duodenal erosions without bleeding. Biopsied. -Recall EGD 3 years 1. Surgical [P], duodenal - PEPTIC DUODENITIS. - NO DYSPLASIA OR MALIGNANCY. 2. 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 - WARTHIN-STARRY IS NEGATIVE FOR HELICOBACTER PYLORI. - NO DYSPLASIA OR MALIGNANCY   Colonoscopy 05/20/2019: - Two 4 to 6 mm polyps in the sigmoid colon and in the cecum, removed with a cold snare. Resected and retrieved. - Three 1 to 2 mm polyps in  the rectum, removed with a cold biopsy forceps. Resected and retrieved. - Non-bleeding internal hemorrhoids. - 7 year recall 1. Surgical [P], cecum, polyp - TUBULAR ADENOMA(S). - NO HIGH GRADE DYSPLASIA OR MALIGNANCY. 2. Surgical [P], sigmoid and rectum, polyp (4) - HYPERPLASTIC POLYP(S). - NO ADENOMATOUS CHANGE OR MALIGNANCY   Current Outpatient Medications on File Prior to Visit  Medication Sig Dispense Refill   carvedilol (COREG) 6.25 MG tablet Take 1 tablet (6.25 mg total) by mouth 2 (two) times daily with a meal. 180 tablet 1   pantoprazole (PROTONIX) 40 MG tablet Take 1 tablet (40 mg total) by mouth 2 (two) times daily before a meal. 60 tablet 1   traZODone (DESYREL) 50 MG tablet Take 1 tablet (50 mg total) by mouth at bedtime as needed for sleep. 30 tablet 3   atorvastatin (LIPITOR) 20 MG tablet Take 1 tablet (20 mg total) by mouth daily. (Patient not taking: Reported on 11/29/2022) 90 tablet 1   cetirizine (ZYRTEC) 10 MG tablet TAKE 1 TABLET (10 MG TOTAL) BY MOUTH DAILY. 90 tablet 1   chlorthalidone (HYGROTON) 25 MG tablet Take 1 tablet (25 mg total) by mouth daily. (Patient not taking: Reported on 11/29/2022) 90 tablet 1   cloNIDine (CATAPRES) 0.1 MG tablet Take 1 tablet (0.1 mg total) by mouth at bedtime. For hot flashes (Patient not taking: Reported on 11/29/2022) 30 tablet 6   diclofenac Sodium (VOLTAREN) 1 % GEL Apply 4 g topically 4 (four) times daily. (Patient not taking: Reported on 11/29/2022) 100 g 1   gabapentin (NEURONTIN) 300 MG capsule TAKE 1 CAPSULE (300 MG TOTAL) BY MOUTH AT BEDTIME. 60 capsule 6   hydrocortisone 2.5 % lotion Apply topically 2 (two) times daily. Apply to affected areas twice daily. (Patient not taking: Reported on 11/29/2022) 59 mL 0   hydrOXYzine (ATARAX) 25 MG tablet Take 1 tablet (25 mg total) by mouth at bedtime as needed (insomnia). (Patient not taking: Reported on 11/29/2022) 30 tablet 3   polyethylene glycol powder (GLYCOLAX/MIRALAX) 17 GM/SCOOP  powder Take 17 g by mouth daily. (Patient not taking: Reported on 11/29/2022) 510 g 1   potassium chloride SA (KLOR-CON M) 20 MEQ tablet Take 1 tablet (20 mEq total) by mouth daily. (Patient not taking: Reported on 11/29/2022) 30 tablet 3   tiZANidine (ZANAFLEX) 4 MG tablet Take 1 tablet (4 mg total) by mouth every 8 (eight) hours as needed for muscle spasms. (Patient not taking: Reported on 11/29/2022) 60 tablet 1   triamcinolone cream (KENALOG) 0.1 % Apply 1 application. topically 2 (two) times daily. (Patient not taking: Reported on 11/29/2022) 45 g 1   vitamin B-12 (CYANOCOBALAMIN) 1000 MCG tablet  Take 1 tablet (1,000 mcg total) by mouth daily. (Patient not taking: Reported on 11/29/2022) 30 tablet 3   No current facility-administered medications on file prior to visit.   Allergies  Allergen Reactions   Amlodipine Rash   Current Medications, Allergies, Past Medical History, Past Surgical History, Family History and Social History were reviewed in [employer] record.  Review of Systems:   Constitutional: Negative for fever, sweats, chills or weight loss.  Respiratory: Negative for shortness of breath.   Cardiovascular: Negative for chest pain, palpitations and leg swelling.  Gastrointestinal: See HPI.  Musculoskeletal: Negative for back pain or muscle aches.  Neurological: Negative for dizziness, headaches or paresthesias.    Physical Exam: LMP 11/15/2014  BP 130/72   Pulse (!) 51   Ht '4\' 10"'$  (1.473 m)   Wt 116 lb (52.6 kg)   LMP 11/15/2014   BMI 24.24 kg/m   General: 52 year old female in no acute distress. Head: Normocephalic and atraumatic. Eyes: No scleral icterus. Conjunctiva pink . Ears: Normal auditory acuity. Mouth: Dentition intact. No ulcers or lesions.  Lungs: Clear throughout to auscultation. Heart: Regular rate and rhythm, no murmur. Abdomen: Soft.  Mild generalized gaseous distention.  Epigastric and RUQ tenderness without rebound or  guarding. No masses or hepatomegaly. Normal bowel sounds x 4 quadrants. Large RUQ scar intact.  Rectal: Deferred.  Musculoskeletal: Symmetrical with no gross deformities. Extremities: No edema. Neurological: Alert oriented x 4. No focal deficits.  Psychological: Alert and cooperative. Normal mood and affect  Assessment and Recommendations:  49) 52 year old female with chronic epigastric pain with generalized abdominal bloat for the past few months.  -CBC, CMP -CTAP with oral and IV contrast  -Reduce Pantoprazole to 40 mg 1 capsule every morning.  Add Famotidine 20 mg 1 p.o. nightly. -Further recommendations to be determined after CT and lab results reviewed  2) History of H. Pylori positive duodenal ulcers in 2018, treated with eradication with epigastric pain s/p EGD 07/2021 showed nonbleeding gastric ulcers and duodenal erosions. Gastric biopsies were negative for H. 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  -See plan in #1 -Repeat EGD 07/2024, earlier if symptoms warrant    3) History of 4 hyperplastic polyps and 1 adenomatous polyp per colonoscopy 05/2019 -Next colon polyp surveillance colonoscopy due 05/2026  4) Anemia, B12 deficiency. Normal Iron levels.  -B12 level  5) Hepatic steatosis  -Check LFTs as ordered above       CC:  Charlott Rakes, MD

## 2022-11-29 ENCOUNTER — Other Ambulatory Visit: Payer: Self-pay

## 2022-11-29 ENCOUNTER — Other Ambulatory Visit (INDEPENDENT_AMBULATORY_CARE_PROVIDER_SITE_OTHER): Payer: Medicaid Other

## 2022-11-29 ENCOUNTER — Ambulatory Visit: Payer: Medicaid Other | Admitting: Nurse Practitioner

## 2022-11-29 ENCOUNTER — Telehealth: Payer: Self-pay

## 2022-11-29 ENCOUNTER — Encounter: Payer: Self-pay | Admitting: Nurse Practitioner

## 2022-11-29 VITALS — BP 130/72 | HR 51 | Ht <= 58 in | Wt 116.0 lb

## 2022-11-29 DIAGNOSIS — R1013 Epigastric pain: Secondary | ICD-10-CM

## 2022-11-29 DIAGNOSIS — G8929 Other chronic pain: Secondary | ICD-10-CM

## 2022-11-29 DIAGNOSIS — K259 Gastric ulcer, unspecified as acute or chronic, without hemorrhage or perforation: Secondary | ICD-10-CM

## 2022-11-29 DIAGNOSIS — R140 Abdominal distension (gaseous): Secondary | ICD-10-CM

## 2022-11-29 DIAGNOSIS — E538 Deficiency of other specified B group vitamins: Secondary | ICD-10-CM | POA: Diagnosis not present

## 2022-11-29 LAB — CBC WITH DIFFERENTIAL/PLATELET
Basophils Absolute: 0 10*3/uL (ref 0.0–0.1)
Basophils Relative: 0.5 % (ref 0.0–3.0)
Eosinophils Absolute: 0.2 10*3/uL (ref 0.0–0.7)
Eosinophils Relative: 3.1 % (ref 0.0–5.0)
HCT: 34.9 % — ABNORMAL LOW (ref 36.0–46.0)
Hemoglobin: 12.4 g/dL (ref 12.0–15.0)
Lymphocytes Relative: 23.7 % (ref 12.0–46.0)
Lymphs Abs: 1.8 10*3/uL (ref 0.7–4.0)
MCHC: 35.6 g/dL (ref 30.0–36.0)
MCV: 100.7 fl — ABNORMAL HIGH (ref 78.0–100.0)
Monocytes Absolute: 0.4 10*3/uL (ref 0.1–1.0)
Monocytes Relative: 5.6 % (ref 3.0–12.0)
Neutro Abs: 5.1 10*3/uL (ref 1.4–7.7)
Neutrophils Relative %: 67.1 % (ref 43.0–77.0)
Platelets: 311 10*3/uL (ref 150.0–400.0)
RBC: 3.47 Mil/uL — ABNORMAL LOW (ref 3.87–5.11)
RDW: 16.3 % — ABNORMAL HIGH (ref 11.5–15.5)
WBC: 7.7 10*3/uL (ref 4.0–10.5)

## 2022-11-29 LAB — COMPREHENSIVE METABOLIC PANEL
ALT: 16 U/L (ref 0–35)
AST: 23 U/L (ref 0–37)
Albumin: 4.4 g/dL (ref 3.5–5.2)
Alkaline Phosphatase: 115 U/L (ref 39–117)
BUN: 10 mg/dL (ref 6–23)
CO2: 25 mEq/L (ref 19–32)
Calcium: 9 mg/dL (ref 8.4–10.5)
Chloride: 107 mEq/L (ref 96–112)
Creatinine, Ser: 0.8 mg/dL (ref 0.40–1.20)
GFR: 83.9 mL/min (ref >60.00)
Glucose, Bld: 85 mg/dL (ref 70–99)
Potassium: 3.7 mEq/L (ref 3.5–5.1)
Sodium: 141 mEq/L (ref 135–145)
Total Bilirubin: 0.8 mg/dL (ref 0.2–1.2)
Total Protein: 7.5 g/dL (ref 6.0–8.3)

## 2022-11-29 LAB — B12 AND FOLATE PANEL
Folate: 12.1 ng/mL (ref >5.9)
Vitamin B-12: 687 pg/mL (ref 211–911)

## 2022-11-29 MED ORDER — PANTOPRAZOLE SODIUM 40 MG PO TBEC
40.0000 mg | DELAYED_RELEASE_TABLET | Freq: Every day | ORAL | 1 refills | Status: DC
  Filled 2022-11-29: qty 30, 30d supply, fill #0

## 2022-11-29 MED ORDER — FAMOTIDINE 20 MG PO TABS
20.0000 mg | ORAL_TABLET | Freq: Every day | ORAL | 1 refills | Status: AC
  Filled 2022-11-29: qty 30, 30d supply, fill #0

## 2022-11-29 NOTE — Telephone Encounter (Signed)
Call and spoke with Pt's daughter. Both patient and patient's daughter is aware andunderstanding of the lab results.

## 2022-11-29 NOTE — Patient Instructions (Addendum)
??????? ????????? ???????? ?? ?????? ??? ???????????? ????? ???? ?????? ???? ??? ?????? ????? ?? ??????? "  B" ??????????? ?????????? ??????? ????? ?????? ??????? ? ?? ????? ???????? ????? ?????????????  ?????? ????? ??????? ??????? ?????????? ??????? ?????? ?????? ????? ???? ?????? ???: Pantoprazole 40 mg & Famotidine 20 mg  ??????? ??? ? ?????? ?? CT ??????? ?? ???? [facility], 1st Floor Radiology ?? ????????? ?????? ?? ????? 12/06/22 ?? ?????? 5 ??? ????????? ?????????? ????? ??????? ???? ??????? ???????????? ??? ????? ? ????? ????? ???????? ????  ?????????? ????????? ????? ?????? ?????????? ??????? ??????????  You may take any medications as prescribed with a small amount of water, if necessary. If you take any of the following medications: METFORMIN, GLUCOPHAGE, GLUCOVANCE, AVANDAMET, RIOMET, FORTAMET, [place] MET, JANUMET, GLUMETZA or METAGLIP, you MAY be asked to HOLD this medication 48 hours AFTER the exam.   The purpose of you drinking the oral contrast is to aid in the visualization of your intestinal tract. The contrast solution may cause some diarrhea. Depending on your individual set of symptoms, you may also receive an intravenous injection of x-ray contrast/dye. Plan on being at [facility] for 45 minutes or longer, depending on the type of exam you are having performed.   If you have any questions regarding your exam or if you need to reschedule, you may call [facility] at [phone] between the hours of 8:00 am and 5:00 pm, Monday-Friday.

## 2022-12-03 DIAGNOSIS — E538 Deficiency of other specified B group vitamins: Secondary | ICD-10-CM | POA: Diagnosis not present

## 2022-12-06 ENCOUNTER — Ambulatory Visit (HOSPITAL_COMMUNITY): Payer: Medicaid Other

## 2022-12-10 DIAGNOSIS — E538 Deficiency of other specified B group vitamins: Secondary | ICD-10-CM | POA: Diagnosis not present

## 2022-12-12 DIAGNOSIS — H2513 Age-related nuclear cataract, bilateral: Secondary | ICD-10-CM | POA: Diagnosis not present

## 2022-12-13 DIAGNOSIS — Z419 Encounter for procedure for purposes other than remedying health state, unspecified: Secondary | ICD-10-CM | POA: Diagnosis not present

## 2022-12-17 DIAGNOSIS — E538 Deficiency of other specified B group vitamins: Secondary | ICD-10-CM | POA: Diagnosis not present

## 2022-12-18 ENCOUNTER — Ambulatory Visit: Payer: Medicaid Other | Attending: Family Medicine | Admitting: Family Medicine

## 2022-12-24 DIAGNOSIS — E538 Deficiency of other specified B group vitamins: Secondary | ICD-10-CM | POA: Diagnosis not present

## 2022-12-25 DIAGNOSIS — G4700 Insomnia, unspecified: Secondary | ICD-10-CM | POA: Diagnosis not present

## 2022-12-25 DIAGNOSIS — R21 Rash and other nonspecific skin eruption: Secondary | ICD-10-CM | POA: Diagnosis not present

## 2022-12-25 DIAGNOSIS — R509 Fever, unspecified: Secondary | ICD-10-CM | POA: Diagnosis not present

## 2022-12-25 DIAGNOSIS — K259 Gastric ulcer, unspecified as acute or chronic, without hemorrhage or perforation: Secondary | ICD-10-CM | POA: Diagnosis not present

## 2022-12-25 DIAGNOSIS — I10 Essential (primary) hypertension: Secondary | ICD-10-CM | POA: Diagnosis not present

## 2022-12-25 DIAGNOSIS — L290 Pruritus ani: Secondary | ICD-10-CM | POA: Diagnosis not present

## 2022-12-31 DIAGNOSIS — E119 Type 2 diabetes mellitus without complications: Secondary | ICD-10-CM | POA: Diagnosis not present

## 2022-12-31 DIAGNOSIS — E785 Hyperlipidemia, unspecified: Secondary | ICD-10-CM | POA: Diagnosis not present

## 2023-01-11 DIAGNOSIS — Z419 Encounter for procedure for purposes other than remedying health state, unspecified: Secondary | ICD-10-CM | POA: Diagnosis not present

## 2023-01-24 DIAGNOSIS — G4700 Insomnia, unspecified: Secondary | ICD-10-CM | POA: Diagnosis not present

## 2023-01-24 DIAGNOSIS — R6889 Other general symptoms and signs: Secondary | ICD-10-CM | POA: Diagnosis not present

## 2023-01-24 DIAGNOSIS — Z5941 Food insecurity: Secondary | ICD-10-CM | POA: Diagnosis not present

## 2023-01-24 DIAGNOSIS — E538 Deficiency of other specified B group vitamins: Secondary | ICD-10-CM | POA: Diagnosis not present

## 2023-01-24 DIAGNOSIS — H5789 Other specified disorders of eye and adnexa: Secondary | ICD-10-CM | POA: Diagnosis not present

## 2023-02-11 DIAGNOSIS — Z419 Encounter for procedure for purposes other than remedying health state, unspecified: Secondary | ICD-10-CM | POA: Diagnosis not present

## 2023-03-04 DIAGNOSIS — J029 Acute pharyngitis, unspecified: Secondary | ICD-10-CM | POA: Diagnosis not present

## 2023-03-04 DIAGNOSIS — R059 Cough, unspecified: Secondary | ICD-10-CM | POA: Diagnosis not present

## 2023-03-04 DIAGNOSIS — Z20822 Contact with and (suspected) exposure to covid-19: Secondary | ICD-10-CM | POA: Diagnosis not present

## 2023-03-12 DIAGNOSIS — R92323 Mammographic fibroglandular density, bilateral breasts: Secondary | ICD-10-CM | POA: Diagnosis not present

## 2023-03-12 DIAGNOSIS — Z1231 Encounter for screening mammogram for malignant neoplasm of breast: Secondary | ICD-10-CM | POA: Diagnosis not present

## 2023-03-13 DIAGNOSIS — Z419 Encounter for procedure for purposes other than remedying health state, unspecified: Secondary | ICD-10-CM | POA: Diagnosis not present

## 2023-03-29 DIAGNOSIS — Z0000 Encounter for general adult medical examination without abnormal findings: Secondary | ICD-10-CM | POA: Diagnosis not present

## 2023-03-29 DIAGNOSIS — E538 Deficiency of other specified B group vitamins: Secondary | ICD-10-CM | POA: Diagnosis not present

## 2023-03-29 DIAGNOSIS — Z131 Encounter for screening for diabetes mellitus: Secondary | ICD-10-CM | POA: Diagnosis not present

## 2023-03-29 DIAGNOSIS — Z1321 Encounter for screening for nutritional disorder: Secondary | ICD-10-CM | POA: Diagnosis not present

## 2023-03-29 DIAGNOSIS — Z13220 Encounter for screening for lipoid disorders: Secondary | ICD-10-CM | POA: Diagnosis not present

## 2023-04-12 DIAGNOSIS — E538 Deficiency of other specified B group vitamins: Secondary | ICD-10-CM | POA: Diagnosis not present

## 2023-04-13 DIAGNOSIS — Z419 Encounter for procedure for purposes other than remedying health state, unspecified: Secondary | ICD-10-CM | POA: Diagnosis not present

## 2023-04-22 DIAGNOSIS — E538 Deficiency of other specified B group vitamins: Secondary | ICD-10-CM | POA: Diagnosis not present

## 2023-05-03 DIAGNOSIS — E538 Deficiency of other specified B group vitamins: Secondary | ICD-10-CM | POA: Diagnosis not present

## 2023-05-09 DIAGNOSIS — E538 Deficiency of other specified B group vitamins: Secondary | ICD-10-CM | POA: Diagnosis not present

## 2023-05-13 DIAGNOSIS — Z419 Encounter for procedure for purposes other than remedying health state, unspecified: Secondary | ICD-10-CM | POA: Diagnosis not present

## 2023-05-15 DIAGNOSIS — K259 Gastric ulcer, unspecified as acute or chronic, without hemorrhage or perforation: Secondary | ICD-10-CM | POA: Diagnosis not present

## 2023-05-15 DIAGNOSIS — G4700 Insomnia, unspecified: Secondary | ICD-10-CM | POA: Diagnosis not present

## 2023-05-15 DIAGNOSIS — Z1330 Encounter for screening examination for mental health and behavioral disorders, unspecified: Secondary | ICD-10-CM | POA: Diagnosis not present

## 2023-05-15 DIAGNOSIS — F332 Major depressive disorder, recurrent severe without psychotic features: Secondary | ICD-10-CM | POA: Diagnosis not present

## 2023-05-15 DIAGNOSIS — E538 Deficiency of other specified B group vitamins: Secondary | ICD-10-CM | POA: Diagnosis not present

## 2023-05-15 DIAGNOSIS — F411 Generalized anxiety disorder: Secondary | ICD-10-CM | POA: Diagnosis not present

## 2023-05-15 DIAGNOSIS — R6889 Other general symptoms and signs: Secondary | ICD-10-CM | POA: Diagnosis not present

## 2023-05-18 ENCOUNTER — Encounter (HOSPITAL_COMMUNITY): Payer: Self-pay | Admitting: Emergency Medicine

## 2023-05-18 ENCOUNTER — Emergency Department (HOSPITAL_COMMUNITY)
Admission: EM | Admit: 2023-05-18 | Discharge: 2023-05-18 | Disposition: A | Discharge status: Home / Self Care | Payer: Medicaid Other | Attending: Emergency Medicine | Admitting: Emergency Medicine

## 2023-05-18 ENCOUNTER — Other Ambulatory Visit: Payer: Self-pay

## 2023-05-18 DIAGNOSIS — R21 Rash and other nonspecific skin eruption: Secondary | ICD-10-CM

## 2023-05-18 DIAGNOSIS — T7840XA Allergy, unspecified, initial encounter: Secondary | ICD-10-CM | POA: Insufficient documentation

## 2023-05-18 MED ORDER — DIPHENHYDRAMINE HCL 25 MG PO TABS: 25.0000 mg | ORAL_TABLET | Freq: Four times a day (QID) | ORAL | 0 refills | Status: DC | PRN

## 2023-05-18 MED ORDER — METHYLPREDNISOLONE 4 MG PO TBPK: ORAL_TABLET | ORAL | 0 refills | Status: DC

## 2023-05-18 MED ORDER — DIPHENHYDRAMINE HCL 25 MG PO CAPS
25.0000 mg | ORAL_CAPSULE | Freq: Once | ORAL | Status: AC
  Administered 2023-05-18: 25 mg via ORAL
  Filled 2023-05-18: qty 1

## 2023-05-18 MED ORDER — DIPHENHYDRAMINE HCL 25 MG PO TABS
25.0000 mg | ORAL_TABLET | Freq: Four times a day (QID) | ORAL | 0 refills | Status: DC | PRN
  Filled 2023-05-18: qty 30, 8d supply, fill #0

## 2023-05-18 MED ORDER — PREDNISONE 20 MG PO TABS
40.0000 mg | ORAL_TABLET | Freq: Once | ORAL | Status: AC
  Administered 2023-05-18: 40 mg via ORAL
  Filled 2023-05-18: qty 2

## 2023-05-18 MED ORDER — METHYLPREDNISOLONE 4 MG PO TBPK
ORAL_TABLET | ORAL | 0 refills | Status: DC
  Filled 2023-05-18: qty 21, fill #0

## 2023-05-18 NOTE — ED Provider Notes (Signed)
[place] EMERGENCY DEPARTMENT AT [facility] Provider Note   CSN: [id number] Arrival date & time: 05/18/23  1745     History  Chief Complaint  Patient presents with   Rash    Alice Woodard is a 52 y.o. female.  52 year old female with prior medical history as detailed below presents for evaluation.  Patient with history of prior episodes of allergic type reactions with itchy uncomfortable rash.  Patient reports current symptoms started approximately 2 days ago.  She has not take anything at home for her symptoms.  She complains of itchy rash distributed across upper extremities, upper chest, and both lower extremities.  She denies difficulty swallowing, difficulty breathing, chest pain, shortness of breath, other complaint.    The history is provided by the patient, a relative and medical records.       Home Medications Prior to Admission medications   Medication Sig Start Date End Date Taking? Authorizing Provider  atorvastatin (LIPITOR) 20 MG tablet Take 1 tablet (20 mg total) by mouth daily. Patient not taking: Reported on 11/29/2022 11/08/21   Claiborne Rigg, NP  carvedilol (COREG) 6.25 MG tablet Take 1 tablet (6.25 mg total) by mouth 2 (two) times daily with a meal. 05/24/22   Hoy Register, MD  cetirizine (ZYRTEC) 10 MG tablet TAKE 1 TABLET (10 MG TOTAL) BY MOUTH DAILY. 11/08/21 02/06/22  Claiborne Rigg, NP  chlorthalidone (HYGROTON) 25 MG tablet Take 1 tablet (25 mg total) by mouth daily. Patient not taking: Reported on 11/29/2022 09/18/22   Hoy Register, MD  cloNIDine (CATAPRES) 0.1 MG tablet Take 1 tablet (0.1 mg total) by mouth at bedtime. For hot flashes Patient not taking: Reported on 11/29/2022 11/08/21   Claiborne Rigg, NP  diclofenac Sodium (VOLTAREN) 1 % GEL Apply 4 g topically 4 (four) times daily. Patient not taking: Reported on 11/29/2022 09/18/22   Hoy Register, MD  famotidine (PEPCID) 20 MG tablet Take 1 tablet (20 mg total) by mouth at  bedtime. 11/29/22   Arnaldo Natal, NP  gabapentin (NEURONTIN) 300 MG capsule TAKE 1 CAPSULE (300 MG TOTAL) BY MOUTH AT BEDTIME. 09/13/20 09/13/21  Hoy Register, MD  hydrocortisone 2.5 % lotion Apply topically 2 (two) times daily. Apply to affected areas twice daily. Patient not taking: Reported on 11/29/2022 07/06/21   Cheryll Cockayne, MD  hydrOXYzine (ATARAX) 25 MG tablet Take 1 tablet (25 mg total) by mouth at bedtime as needed (insomnia). Patient not taking: Reported on 11/29/2022 02/08/22   Hoy Register, MD  pantoprazole (PROTONIX) 40 MG tablet Take 1 tablet (40 mg total) by mouth 2 (two) times daily before a meal. 10/16/22   Kennedy-Smith, Malachi Carl, NP  pantoprazole (PROTONIX) 40 MG tablet Take 1 tablet (40 mg total) by mouth daily. Take 30 minutes before breakfast. 11/29/22   Arnaldo Natal, NP  polyethylene glycol powder (GLYCOLAX/MIRALAX) 17 GM/SCOOP powder Take 17 g by mouth daily. Patient not taking: Reported on 11/29/2022 02/08/22   Hoy Register, MD  potassium chloride SA (KLOR-CON M) 20 MEQ tablet Take 1 tablet (20 mEq total) by mouth daily. Patient not taking: Reported on 11/29/2022 09/19/22   Hoy Register, MD  tiZANidine (ZANAFLEX) 4 MG tablet Take 1 tablet (4 mg total) by mouth every 8 (eight) hours as needed for muscle spasms. Patient not taking: Reported on 11/29/2022 05/24/22   Hoy Register, MD  traZODone (DESYREL) 50 MG tablet Take 1 tablet (50 mg total) by mouth at bedtime as needed for sleep. 09/18/22  Hoy Register, MD  triamcinolone cream (KENALOG) 0.1 % Apply 1 application. topically 2 (two) times daily. Patient not taking: Reported on 11/29/2022 02/08/22   Hoy Register, MD  vitamin B-12 (CYANOCOBALAMIN) 1000 MCG tablet Take 1 tablet (1,000 mcg total) by mouth daily. Patient not taking: Reported on 11/29/2022 05/24/22   Hoy Register, MD      Allergies    Amlodipine    Review of Systems   Review of Systems  All other systems reviewed and are  negative.   Physical Exam Updated Vital Signs BP (!) 165/90 (BP Location: Right Arm)   Pulse 63   Temp 98 F (36.7 C) (Oral)   Resp 18   LMP 11/15/2014   SpO2 100%  Physical Exam Vitals and nursing note reviewed.  Constitutional:      General: She is not in acute distress.    Appearance: Normal appearance. She is well-developed.  HENT:     Head: Normocephalic and atraumatic.  Eyes:     Conjunctiva/sclera: Conjunctivae normal.     Pupils: Pupils are equal, round, and reactive to light.  Cardiovascular:     Rate and Rhythm: Normal rate and regular rhythm.     Heart sounds: Normal heart sounds.  Pulmonary:     Effort: Pulmonary effort is normal. No respiratory distress.     Breath sounds: Normal breath sounds.  Abdominal:     General: There is no distension.     Palpations: Abdomen is soft.     Tenderness: There is no abdominal tenderness.  Musculoskeletal:        General: No deformity. Normal range of motion.     Cervical back: Normal range of motion and neck supple.  Skin:    General: Skin is warm and dry.     Comments: Diffuse mildly erythematous rash distributed across both upper extremities, chest, abdomen, both lower extremities  Consistent with allergic type reaction.  Neurological:     General: No focal deficit present.     Mental Status: She is alert and oriented to person, place, and time.     ED Results / Procedures / Treatments   Labs (all labs ordered are listed, but only abnormal results are displayed) Labs Reviewed - No data to display  EKG None  Radiology No results found.  Procedures Procedures    Medications Ordered in ED Medications - No data to display  ED Course/ Medical Decision Making/ A&P                             Medical Decision Making Risk OTC drugs. Prescription drug management.    Medical Screen Complete  This patient presented to the ED with complaint of rash, allergic reaction.  This complaint involves an  extensive number of treatment options. The initial differential diagnosis includes, but is not limited to, allergic reaction, etc.  This presentation is: Acute, Self-Limited, Previously Undiagnosed, Uncertain Prognosis, Complicated, and Systemic Symptoms  Patient is presenting with diffuse erythematous rash across upper extremities, chest, lower extremities.  Consistent with likely allergic reaction.  Patient has had this previously.  She reports good response to steroids.  Will prescribe antihistamines and Medrol Dosepak.  Patient does understand need for close outpatient follow-up.  Strict return precautions given and understood.  Patient's family member translated throughout the entirety of evaluation.  On line Nepali translator was not available.   Additional history obtained: External records from outside sources obtained and reviewed including prior  ED visits and prior Inpatient records.   Medicines ordered:  I ordered medication including prednisone, Benadryl for allergic reaction Reevaluation of the patient after these medicines showed that the patient: improved  Problem List / ED Course:  Allergic reaction   Reevaluation:  After the interventions noted above, I reevaluated the patient and found that they have: improved   Disposition:  After consideration of the diagnostic results and the patients response to treatment, I feel that the patent would benefit from close outpatient follow-up.          Final Clinical Impression(s) / ED Diagnoses Final diagnoses:  Rash  Allergic reaction, initial encounter    Rx / DC Orders ED Discharge Orders          Ordered    methylPREDNISolone (MEDROL DOSEPAK) 4 MG TBPK tablet        05/18/23 1929    diphenhydrAMINE (BENADRYL) 25 MG tablet  Every 6 hours PRN        05/18/23 1929              Wynetta Fines, MD 05/18/23 [phone]

## 2023-05-18 NOTE — ED Triage Notes (Signed)
Pt presents with rash and itching all over for 3 days. No benadryl or other meds at home.  No known cause.  Airway intact. NAD

## 2023-05-18 NOTE — Discharge Instructions (Signed)
Return for any problem.  ?

## 2023-05-20 ENCOUNTER — Other Ambulatory Visit: Payer: Self-pay

## 2023-05-20 DIAGNOSIS — G4700 Insomnia, unspecified: Secondary | ICD-10-CM | POA: Diagnosis not present

## 2023-05-20 DIAGNOSIS — I10 Essential (primary) hypertension: Secondary | ICD-10-CM | POA: Diagnosis not present

## 2023-05-20 DIAGNOSIS — E785 Hyperlipidemia, unspecified: Secondary | ICD-10-CM | POA: Diagnosis not present

## 2023-05-21 ENCOUNTER — Telehealth: Payer: Self-pay

## 2023-05-21 NOTE — Telephone Encounter (Signed)
Chart review completed for patient. Patient is due for screening mammogram. Left message on voice mail for patient to inquire about scheduling mammogram.  [address], Population Health Specialist.   

## 2023-06-13 DIAGNOSIS — Z419 Encounter for procedure for purposes other than remedying health state, unspecified: Secondary | ICD-10-CM | POA: Diagnosis not present

## 2023-06-16 ENCOUNTER — Emergency Department (HOSPITAL_COMMUNITY)
Admission: EM | Admit: 2023-06-16 | Discharge: 2023-06-16 | Disposition: A | Discharge status: Home / Self Care | Payer: Medicaid Other

## 2023-06-16 ENCOUNTER — Other Ambulatory Visit: Payer: Self-pay

## 2023-06-16 ENCOUNTER — Encounter (HOSPITAL_COMMUNITY): Payer: Self-pay

## 2023-06-16 ENCOUNTER — Emergency Department (HOSPITAL_BASED_OUTPATIENT_CLINIC_OR_DEPARTMENT_OTHER): Payer: Medicaid Other

## 2023-06-16 ENCOUNTER — Emergency Department (HOSPITAL_COMMUNITY): Payer: Medicaid Other

## 2023-06-16 DIAGNOSIS — S90111A Contusion of right great toe without damage to nail, initial encounter: Secondary | ICD-10-CM | POA: Diagnosis not present

## 2023-06-16 DIAGNOSIS — M79671 Pain in right foot: Secondary | ICD-10-CM | POA: Diagnosis not present

## 2023-06-16 DIAGNOSIS — X58XXXA Exposure to other specified factors, initial encounter: Secondary | ICD-10-CM | POA: Insufficient documentation

## 2023-06-16 DIAGNOSIS — I10 Essential (primary) hypertension: Secondary | ICD-10-CM | POA: Diagnosis not present

## 2023-06-16 DIAGNOSIS — M79604 Pain in right leg: Secondary | ICD-10-CM

## 2023-06-16 DIAGNOSIS — Z79899 Other long term (current) drug therapy: Secondary | ICD-10-CM | POA: Diagnosis not present

## 2023-06-16 DIAGNOSIS — J069 Acute upper respiratory infection, unspecified: Secondary | ICD-10-CM | POA: Diagnosis not present

## 2023-06-16 DIAGNOSIS — S301XXA Contusion of abdominal wall, initial encounter: Secondary | ICD-10-CM | POA: Diagnosis not present

## 2023-06-16 DIAGNOSIS — M7731 Calcaneal spur, right foot: Secondary | ICD-10-CM | POA: Diagnosis not present

## 2023-06-16 DIAGNOSIS — E876 Hypokalemia: Secondary | ICD-10-CM | POA: Insufficient documentation

## 2023-06-16 DIAGNOSIS — Z1152 Encounter for screening for COVID-19: Secondary | ICD-10-CM | POA: Insufficient documentation

## 2023-06-16 DIAGNOSIS — M79661 Pain in right lower leg: Secondary | ICD-10-CM | POA: Diagnosis not present

## 2023-06-16 DIAGNOSIS — M25571 Pain in right ankle and joints of right foot: Secondary | ICD-10-CM | POA: Diagnosis not present

## 2023-06-16 LAB — CBC WITH DIFFERENTIAL/PLATELET
Abs Immature Granulocytes: 0.03 10*3/uL (ref 0.00–0.07)
Basophils Absolute: 0 10*3/uL (ref 0.0–0.1)
Basophils Relative: 1 %
Eosinophils Absolute: 0.2 10*3/uL (ref 0.0–0.5)
Eosinophils Relative: 3 %
HCT: 35.6 % — ABNORMAL LOW (ref 36.0–46.0)
Hemoglobin: 12.3 g/dL (ref 12.0–15.0)
Immature Granulocytes: 0 %
Lymphocytes Relative: 25 %
Lymphs Abs: 2 10*3/uL (ref 0.7–4.0)
MCH: 31.4 pg (ref 26.0–34.0)
MCHC: 34.6 g/dL (ref 30.0–36.0)
MCV: 90.8 fL (ref 80.0–100.0)
Monocytes Absolute: 0.6 10*3/uL (ref 0.1–1.0)
Monocytes Relative: 7 %
Neutro Abs: 5.3 10*3/uL (ref 1.7–7.7)
Neutrophils Relative %: 64 %
Platelets: 274 10*3/uL (ref 150–400)
RBC: 3.92 MIL/uL (ref 3.87–5.11)
RDW: 12.9 % (ref 11.5–15.5)
WBC: 8.2 10*3/uL (ref 4.0–10.5)
nRBC: 0 % (ref 0.0–0.2)

## 2023-06-16 LAB — COMPREHENSIVE METABOLIC PANEL
ALT: 14 U/L (ref 0–44)
AST: 21 U/L (ref 15–41)
Albumin: 3.6 g/dL (ref 3.5–5.0)
Alkaline Phosphatase: 85 U/L (ref 38–126)
Anion gap: 10 (ref 5–15)
BUN: 9 mg/dL (ref 6–20)
CO2: 24 mmol/L (ref 22–32)
Calcium: 8.8 mg/dL — ABNORMAL LOW (ref 8.9–10.3)
Chloride: 107 mmol/L (ref 98–111)
Creatinine, Ser: 1.03 mg/dL — ABNORMAL HIGH (ref 0.44–1.00)
GFR, Estimated: >60 mL/min (ref >60)
Glucose, Bld: 148 mg/dL — ABNORMAL HIGH (ref 70–99)
Potassium: 3.3 mmol/L — ABNORMAL LOW (ref 3.5–5.1)
Sodium: 141 mmol/L (ref 135–145)
Total Bilirubin: 0.9 mg/dL (ref 0.3–1.2)
Total Protein: 6.8 g/dL (ref 6.5–8.1)

## 2023-06-16 LAB — SARS CORONAVIRUS 2 BY RT PCR: SARS Coronavirus 2 by RT PCR: NEGATIVE

## 2023-06-16 LAB — GROUP A STREP BY PCR: Group A Strep by PCR: NOT DETECTED

## 2023-06-16 MED ORDER — ACETAMINOPHEN 500 MG PO TABS
1000.0000 mg | ORAL_TABLET | Freq: Once | ORAL | Status: AC
  Administered 2023-06-16: 1000 mg via ORAL
  Filled 2023-06-16: qty 2

## 2023-06-16 MED ORDER — POTASSIUM CHLORIDE CRYS ER 20 MEQ PO TBCR
40.0000 meq | EXTENDED_RELEASE_TABLET | Freq: Once | ORAL | Status: AC
  Administered 2023-06-16: 40 meq via ORAL
  Filled 2023-06-16: qty 2

## 2023-06-16 MED ORDER — DEXAMETHASONE SODIUM PHOSPHATE 10 MG/ML IJ SOLN
10.0000 mg | Freq: Once | INTRAMUSCULAR | Status: AC
  Administered 2023-06-16: 10 mg via INTRAMUSCULAR
  Filled 2023-06-16: qty 1

## 2023-06-16 MED ORDER — OXYCODONE HCL 5 MG PO TABS
5.0000 mg | ORAL_TABLET | Freq: Once | ORAL | Status: AC
  Administered 2023-06-16: 5 mg via ORAL
  Filled 2023-06-16: qty 1

## 2023-06-16 MED ORDER — IBUPROFEN 400 MG PO TABS
600.0000 mg | ORAL_TABLET | Freq: Once | ORAL | Status: AC
  Administered 2023-06-16: 600 mg via ORAL
  Filled 2023-06-16: qty 1

## 2023-06-16 NOTE — ED Triage Notes (Signed)
Pt came in via POV d/t Rt calf/ankle/foot pain for the past 2-3 days. States that it hurts to walk on it & it feels like a burning as well. Also is concerned d/t her Rt Great & pinky toe hurts & she has not hit or injured it. Denies any recent falls or injuries, rates her pain 7/10 while in triage.

## 2023-06-16 NOTE — ED Provider Notes (Signed)
[place] EMERGENCY DEPARTMENT AT [facility] Provider Note   CSN: [id number] Arrival date & time: 06/16/23  1531     History  Chief Complaint  Patient presents with   RLE pain/burning    Alice Woodard is a 52 y.o. female with past medical history hypertension, hyperlipidemia, anemia presents to the ED with her daughter for multiple complaints.  Primary complaint today is a burning pain to the right leg.  Notes that it is throughout the entirety of the leg.  No back pain.  No injury to the leg.  Notes an area of ecchymosis over the big toe and fifth toe without explanation for this.  No history of diabetes.  No recent travel, recent surgery.  No history of DVT.  No localized area of pain.  Does state that she has increased pain with ambulation but is able to do so.  Also complains of a sore throat, cough, and voice hoarseness for the last couple days.  No known sick contacts.  No fevers, chest pain, shortness of breath.  Also states that she has a small area of ecchymosis to her abdomen and is unsure how this occurred.  No trauma to the abdomen.  No history of bleeding disorders or unexplained bruising/other reported easy bleeding.  No cancer history.  No abdominal pain, nausea, vomiting, or diarrhea.      Home Medications Prior to Admission medications   Medication Sig Start Date End Date Taking? Authorizing Provider  atorvastatin (LIPITOR) 20 MG tablet Take 1 tablet (20 mg total) by mouth daily. Patient not taking: Reported on 11/29/2022 11/08/21   Claiborne Rigg, NP  carvedilol (COREG) 6.25 MG tablet Take 1 tablet (6.25 mg total) by mouth 2 (two) times daily with a meal. 05/24/22   Hoy Register, MD  cetirizine (ZYRTEC) 10 MG tablet TAKE 1 TABLET (10 MG TOTAL) BY MOUTH DAILY. 11/08/21 02/06/22  Claiborne Rigg, NP  chlorthalidone (HYGROTON) 25 MG tablet Take 1 tablet (25 mg total) by mouth daily. Patient not taking: Reported on 11/29/2022 09/18/22   Hoy Register, MD   cloNIDine (CATAPRES) 0.1 MG tablet Take 1 tablet (0.1 mg total) by mouth at bedtime. For hot flashes Patient not taking: Reported on 11/29/2022 11/08/21   Claiborne Rigg, NP  diclofenac Sodium (VOLTAREN) 1 % GEL Apply 4 g topically 4 (four) times daily. Patient not taking: Reported on 11/29/2022 09/18/22   Hoy Register, MD  diphenhydrAMINE (BENADRYL) 25 MG tablet Take 1 tablet (25 mg total) by mouth every 6 (six) hours as needed for itching. 05/18/23   Wynetta Fines, MD  famotidine (PEPCID) 20 MG tablet Take 1 tablet (20 mg total) by mouth at bedtime. 11/29/22   Arnaldo Natal, NP  gabapentin (NEURONTIN) 300 MG capsule TAKE 1 CAPSULE (300 MG TOTAL) BY MOUTH AT BEDTIME. 09/13/20 09/13/21  Hoy Register, MD  hydrocortisone 2.5 % lotion Apply topically 2 (two) times daily. Apply to affected areas twice daily. Patient not taking: Reported on 11/29/2022 07/06/21   Cheryll Cockayne, MD  hydrOXYzine (ATARAX) 25 MG tablet Take 1 tablet (25 mg total) by mouth at bedtime as needed (insomnia). Patient not taking: Reported on 11/29/2022 02/08/22   Hoy Register, MD  methylPREDNISolone (MEDROL DOSEPAK) 4 MG TBPK tablet Take as per manufacturer's instructions. 05/18/23   Wynetta Fines, MD  pantoprazole (PROTONIX) 40 MG tablet Take 1 tablet (40 mg total) by mouth 2 (two) times daily before a meal. 10/16/22   Arnaldo Natal, NP  pantoprazole (PROTONIX) 40 MG tablet Take 1 tablet (40 mg total) by mouth daily. Take 30 minutes before breakfast. 11/29/22   Arnaldo Natal, NP  polyethylene glycol powder (GLYCOLAX/MIRALAX) 17 GM/SCOOP powder Take 17 g by mouth daily. Patient not taking: Reported on 11/29/2022 02/08/22   Hoy Register, MD  potassium chloride SA (KLOR-CON M) 20 MEQ tablet Take 1 tablet (20 mEq total) by mouth daily. Patient not taking: Reported on 11/29/2022 09/19/22   Hoy Register, MD  tiZANidine (ZANAFLEX) 4 MG tablet Take 1 tablet (4 mg total) by mouth every 8 (eight)  hours as needed for muscle spasms. Patient not taking: Reported on 11/29/2022 05/24/22   Hoy Register, MD  traZODone (DESYREL) 50 MG tablet Take 1 tablet (50 mg total) by mouth at bedtime as needed for sleep. 09/18/22   Hoy Register, MD  triamcinolone cream (KENALOG) 0.1 % Apply 1 application. topically 2 (two) times daily. Patient not taking: Reported on 11/29/2022 02/08/22   Hoy Register, MD  vitamin B-12 (CYANOCOBALAMIN) 1000 MCG tablet Take 1 tablet (1,000 mcg total) by mouth daily. Patient not taking: Reported on 11/29/2022 05/24/22   Hoy Register, MD      Allergies    Amlodipine    Review of Systems   Review of Systems  All other systems reviewed and are negative.   Physical Exam Updated Vital Signs BP (!) 167/97 (BP Location: Right Arm)   Pulse (!) 55   Temp 98.3 F (36.8 C) (Oral)   Resp 18   Ht 5' (1.524 m)   Wt 52.2 kg   LMP 11/15/2014   SpO2 100%   BMI 22.46 kg/m  Physical Exam Vitals and nursing note reviewed.  Constitutional:      General: She is not in acute distress.    Appearance: Normal appearance. She is not ill-appearing or toxic-appearing.  HENT:     Head: Normocephalic and atraumatic.     Mouth/Throat:     Mouth: Mucous membranes are moist.     Pharynx: Oropharynx is clear. No oropharyngeal exudate or posterior oropharyngeal erythema.  Eyes:     General: No scleral icterus.    Conjunctiva/sclera: Conjunctivae normal.  Cardiovascular:     Rate and Rhythm: Normal rate and regular rhythm.     Heart sounds: No murmur heard. Pulmonary:     Effort: Pulmonary effort is normal. No respiratory distress.     Breath sounds: Normal breath sounds.  Abdominal:     General: Abdomen is flat. There is no distension.     Palpations: Abdomen is soft.     Tenderness: There is no abdominal tenderness. There is no guarding or rebound.     Comments: Small circular area of ecchymosis to the mid abdomen, no induration or fluctuance, nontender  Musculoskeletal:      Cervical back: Normal range of motion and neck supple. No rigidity.     Right lower leg: No edema.     Left lower leg: No edema.     Comments: Minimal area of ecchymosis over the right dorsal aspect of the great toe, 2+ DP and PT pulses, range of motion of the right lower extremity grossly intact, soft compartments, no areas of localized tenderness, negative Homans' sign, no overlying erythema or increased warmth, no wounds, pelvis stable  Skin:    General: Skin is warm and dry.     Capillary Refill: Capillary refill takes less than 2 seconds.  Neurological:     Mental Status: She is alert. Mental status is  at baseline.  Psychiatric:        Behavior: Behavior normal.     ED Results / Procedures / Treatments   Labs (all labs ordered are listed, but only abnormal results are displayed) Labs Reviewed  CBC WITH DIFFERENTIAL/PLATELET - Abnormal; Notable for the following components:      Result Value   HCT 35.6 (*)    All other components within normal limits  COMPREHENSIVE METABOLIC PANEL - Abnormal; Notable for the following components:   Potassium 3.3 (*)    Glucose, Bld 148 (*)    Creatinine, Ser 1.03 (*)    Calcium 8.8 (*)    All other components within normal limits  GROUP A STREP BY PCR  SARS CORONAVIRUS 2 BY RT PCR    EKG None  Radiology VAS [place] LOWER EXTREMITY VENOUS (DVT) (7a-7p)  Result Date: 06/16/2023  Lower Venous DVT Study Patient Name:  DELOIS SILVESTER  Date of Exam:   06/16/2023 Medical Rec #: [id number]       Accession #:    [id number] Date of Birth: [birth year]        Patient Gender: F Patient Age:   72 years Exam Location:  [facility] Procedure:      VAS [place] LOWER EXTREMITY VENOUS (DVT) Referring Phys: [facility]  --------------------------------------------------------------------------------  Indications: Pain.  Comparison Study: No previous exams Performing Technologist: Jody Hill RVT, RDMS  Examination Guidelines: A complete evaluation includes B-mode  imaging, spectral Doppler, color Doppler, and power Doppler as needed of all accessible portions of each vessel. Bilateral testing is considered an integral part of a complete examination. Limited examinations for reoccurring indications may be performed as noted. The reflux portion of the exam is performed with the patient in reverse Trendelenburg.  +---------+---------------+---------+-----------+----------+--------------+ RIGHT    CompressibilityPhasicitySpontaneityPropertiesThrombus Aging +---------+---------------+---------+-----------+----------+--------------+ CFV      Full           Yes      Yes                                 +---------+---------------+---------+-----------+----------+--------------+ SFJ      Full                                                        +---------+---------------+---------+-----------+----------+--------------+ FV Prox  Full           Yes      Yes                                 +---------+---------------+---------+-----------+----------+--------------+ FV Mid   Full           Yes      Yes                                 +---------+---------------+---------+-----------+----------+--------------+ FV DistalFull           Yes      Yes                                 +---------+---------------+---------+-----------+----------+--------------+ PFV      Full                                                        +---------+---------------+---------+-----------+----------+--------------+  POP      Full           Yes      Yes                                 +---------+---------------+---------+-----------+----------+--------------+ PTV      Full                                                        +---------+---------------+---------+-----------+----------+--------------+ PERO     Full                                                        +---------+---------------+---------+-----------+----------+--------------+    +----+---------------+---------+-----------+----------+--------------+ LEFTCompressibilityPhasicitySpontaneityPropertiesThrombus Aging +----+---------------+---------+-----------+----------+--------------+ CFV Full           Yes      Yes                                 +----+---------------+---------+-----------+----------+--------------+     Summary: RIGHT: - There is no evidence of deep vein thrombosis in the lower extremity.  - No cystic structure found in the popliteal fossa.  LEFT: - No evidence of common femoral vein obstruction.  *See table(s) above for measurements and observations.    Preliminary    DG Ankle 2 Views Right  Result Date: 06/16/2023 CLINICAL DATA:  144615 Pain 144615 EXAM: RIGHT ANKLE - 2 VIEW COMPARISON:  None Available. FINDINGS: There is no evidence of fracture, dislocation, or joint effusion. There is no evidence of arthropathy or other focal bone abnormality. Soft tissues are unremarkable. Calcaneal spurs. IMPRESSION: Negative. Electronically Signed   By: Corlis Leak M.D.   On: 06/16/2023 16:55   DG Foot Complete Right  Result Date: 06/16/2023 CLINICAL DATA:  144615 Pain 144615 EXAM: RIGHT FOOT COMPLETE - 3+ VIEW COMPARISON:  None Available. FINDINGS: There is no evidence of fracture or dislocation. There is no evidence of arthropathy or other focal bone abnormality. Soft tissues are unremarkable. Calcaneal spurs. IMPRESSION: Negative. Electronically Signed   By: Corlis Leak M.D.   On: 06/16/2023 16:54   DG Tibia/Fibula Right  Result Date: 06/16/2023 CLINICAL DATA:  144615 Pain 144615 EXAM: RIGHT TIBIA AND FIBULA - 2 VIEW COMPARISON:  None Available. FINDINGS: There is no evidence of fracture or acute or focal bone lesions. Small calcaneal spurs. Soft tissues are unremarkable. IMPRESSION: 1. No acute findings. 2. Small calcaneal spurs. Electronically Signed   By: Corlis Leak M.D.   On: 06/16/2023 16:54    Procedures Procedures    Medications Ordered in  ED Medications  dexamethasone (DECADRON) injection 10 mg (10 mg Intramuscular Given 06/16/23 1731)  oxyCODONE (Oxy IR/ROXICODONE) immediate release tablet 5 mg (5 mg Oral Given 06/16/23 1731)  acetaminophen (TYLENOL) tablet 1,000 mg (1,000 mg Oral Given 06/16/23 1731)  ibuprofen (ADVIL) tablet 600 mg (600 mg Oral Given 06/16/23 1731)  potassium chloride SA (KLOR-CON M) CR tablet 40 mEq (40 mEq Oral Given 06/16/23 1836)    ED Course/ Medical Decision Making/ A&P  Medical Decision Making Amount and/or Complexity of Data Reviewed Labs: ordered. Decision-making details documented in ED Course. Radiology: ordered. Decision-making details documented in ED Course.  Risk OTC drugs. Prescription drug management.   Medical Decision Making:   Marlin Brys is a 52 y.o. female who presented to the ED today with leg pain detailed above.    Additional history discussed with patient's family/caregivers.  Patient's presentation is complicated by their history of HTN, HLD.  Complete initial physical exam performed, notably the patient was in no acute distress, nontoxic-appearing.  No respiratory distress.  No calf tenderness.  Right lower extremity with soft compartments, neurovascularly intact.    Reviewed and confirmed nursing documentation for past medical history, family history, social history.    Initial Assessment:   With the patient's presentation, differential diagnosis includes but is not limited to contusion, fracture, dislocation, sprain, strain, DVT, septic joint, compartment syndrome. This is most consistent with an acute complicated illness  Initial Plan:  Screening labs including CBC and Metabolic panel to evaluate for infectious or metabolic etiology of disease.   X-rays to evaluate for bony pathology Strep swab / COVID swab to evaluate for etiology of voice hoarseness DVT study Symptomatic management Objective evaluation as below reviewed   Initial  Study Results:   Laboratory  All laboratory results reviewed without evidence of clinically relevant pathology.   Exceptions include: Potassium 3.3, creatinine 1.03,  Radiology:  All images reviewed independently. Agree with radiology report at this time.   VAS [place] LOWER EXTREMITY VENOUS (DVT) (7a-7p)  Result Date: 06/16/2023  Lower Venous DVT Study Patient Name:  SHANISE BALCH  Date of Exam:   06/16/2023 Medical Rec #: [id number]       Accession #:    [id number] Date of Birth: 07-18-1971        Patient Gender: F Patient Age:   33 years Exam Location:  [facility] Procedure:      VAS [place] LOWER EXTREMITY VENOUS (DVT) Referring Phys: [facility]  --------------------------------------------------------------------------------  Indications: Pain.  Comparison Study: No previous exams Performing Technologist: Jody Hill RVT, RDMS  Examination Guidelines: A complete evaluation includes B-mode imaging, spectral Doppler, color Doppler, and power Doppler as needed of all accessible portions of each vessel. Bilateral testing is considered an integral part of a complete examination. Limited examinations for reoccurring indications may be performed as noted. The reflux portion of the exam is performed with the patient in reverse Trendelenburg.  +---------+---------------+---------+-----------+----------+--------------+ RIGHT    CompressibilityPhasicitySpontaneityPropertiesThrombus Aging +---------+---------------+---------+-----------+----------+--------------+ CFV      Full           Yes      Yes                                 +---------+---------------+---------+-----------+----------+--------------+ SFJ      Full                                                        +---------+---------------+---------+-----------+----------+--------------+ FV Prox  Full           Yes      Yes                                 +---------+---------------+---------+-----------+----------+--------------+  FV Mid   Full           Yes      Yes                                 +---------+---------------+---------+-----------+----------+--------------+ FV DistalFull           Yes      Yes                                 +---------+---------------+---------+-----------+----------+--------------+ PFV      Full                                                        +---------+---------------+---------+-----------+----------+--------------+ POP      Full           Yes      Yes                                 +---------+---------------+---------+-----------+----------+--------------+ PTV      Full                                                        +---------+---------------+---------+-----------+----------+--------------+ PERO     Full                                                        +---------+---------------+---------+-----------+----------+--------------+   +----+---------------+---------+-----------+----------+--------------+ LEFTCompressibilityPhasicitySpontaneityPropertiesThrombus Aging +----+---------------+---------+-----------+----------+--------------+ CFV Full           Yes      Yes                                 +----+---------------+---------+-----------+----------+--------------+     Summary: RIGHT: - There is no evidence of deep vein thrombosis in the lower extremity.  - No cystic structure found in the popliteal fossa.  LEFT: - No evidence of common femoral vein obstruction.  *See table(s) above for measurements and observations.    Preliminary    DG Ankle 2 Views Right  Result Date: 06/16/2023 CLINICAL DATA:  144615 Pain 144615 EXAM: RIGHT ANKLE - 2 VIEW COMPARISON:  None Available. FINDINGS: There is no evidence of fracture, dislocation, or joint effusion. There is no evidence of arthropathy or other focal bone abnormality. Soft tissues are unremarkable. Calcaneal spurs. IMPRESSION: Negative. Electronically Signed   By: Corlis Leak M.D.    On: 06/16/2023 16:55   DG Foot Complete Right  Result Date: 06/16/2023 CLINICAL DATA:  144615 Pain 144615 EXAM: RIGHT FOOT COMPLETE - 3+ VIEW COMPARISON:  None Available. FINDINGS: There is no evidence of fracture or dislocation. There is no evidence of arthropathy or other focal bone abnormality. Soft tissues are unremarkable. Calcaneal spurs. IMPRESSION: Negative. Electronically Signed   By: Ronald Pippins.D.  On: 06/16/2023 16:54   DG Tibia/Fibula Right  Result Date: 06/16/2023 CLINICAL DATA:  144615 Pain 144615 EXAM: RIGHT TIBIA AND FIBULA - 2 VIEW COMPARISON:  None Available. FINDINGS: There is no evidence of fracture or acute or focal bone lesions. Small calcaneal spurs. Soft tissues are unremarkable. IMPRESSION: 1. No acute findings. 2. Small calcaneal spurs. Electronically Signed   By: Corlis Leak M.D.   On: 06/16/2023 16:54      Final Assessment and Plan:   52 year old female presents with multiple complaints.  Primary complaint being right leg pain.  No fall or injury to the leg.  She does have an area of ecchymosis over the great toe and does not know how she sustained this.  Neurovascularly intact.  Soft compartments.  Good range of motion and ambulatory in the ED without signs of distress.  No edema.  No overlying skin changes.  Low suspicion for acute process.  X-rays ordered performed triage negative for acute bony process.  Patient states that she has increased pain with weightbearing.  No risk factors identified for DVT however with symptoms did obtain vascular ultrasound which was negative for DVT.  Also with multiple other complaints including an unexplained area of bruising to her abdomen.  She does have a small circular area of ecchymosis that appears to be several days old.  Denies any trauma to the abdomen.  Denies any history of bleeding or clotting disorders as well as any other areas of bleeding.  Normal hemoglobin on blood work.  No significant findings on CBC or BMP.  Family  member expresses concern due to family history of cancer.  Patient without any personal history of malignancy.  Extensive discussion with patient/family regarding importance of close outpatient follow-up with primary care for further evaluation of these concerns and possible referral to a hematologist or other specialist as indicated.  Also complaining of sore throat and cough.  COVID and strep swabs negative.  Patent airway, lungs clear to auscultation, no respiratory distress, normal oxygen saturation on room air.  Suspect viral etiology.  Will recommend over-the-counter medications to help with this as needed.  Overall, do not believe that patient has any emergent process during today's ED visit and believe she is stable for outpatient follow-up with primary care as well as orthopedics which was provided.  Strict ED return precautions given, all questions answered, and stable for discharge.   Clinical Impression:  1. Right leg pain   2. Hypokalemia   3. Upper respiratory tract infection, unspecified type      Discharge           Final Clinical Impression(s) / ED Diagnoses Final diagnoses:  Hypokalemia  Right leg pain  Upper respiratory tract infection, unspecified type    Rx / DC Orders ED Discharge Orders     None         Tonette Lederer, PA-C 06/16/23 2031    Coral Spikes, DO 06/16/23 2156

## 2023-06-16 NOTE — Progress Notes (Signed)
RLE venous duplex has been completed.  Preliminary results given to Ysidro Evert, PA-C.   Results can be found under chart review under CV PROC. 06/16/2023 7:59 PM  RVT, RDMS

## 2023-06-16 NOTE — ED Notes (Signed)
Pt also reports that she has a bruise on her stomach & it is concerning her. Also her voice is "going" & does not sound the same (per family at bedside).

## 2023-06-16 NOTE — Discharge Instructions (Signed)
Thank you for letting us take care of you today.  Overall, your workup was reassuring.  Your x-rays were normal.  Your ultrasound did not show blood clot.  Your COVID and strep swabs are negative.  Your blood work did not show any significant abnormalities.  Please follow-up closely with your PCP for any further concerns.  If you continue to have issues with leg pain, I also provided orthopedics that you may follow-up with as they specialize in issues with the extremities and bones.  If you develop any new or worsening symptoms, return to the nearest ED for reevaluation

## 2023-06-25 DIAGNOSIS — R4181 Age-related cognitive decline: Secondary | ICD-10-CM | POA: Diagnosis not present

## 2023-06-26 DIAGNOSIS — R4181 Age-related cognitive decline: Secondary | ICD-10-CM | POA: Diagnosis not present

## 2023-06-27 DIAGNOSIS — R3981 Functional urinary incontinence: Secondary | ICD-10-CM | POA: Diagnosis not present

## 2023-06-27 DIAGNOSIS — I10 Essential (primary) hypertension: Secondary | ICD-10-CM | POA: Diagnosis not present

## 2023-06-27 DIAGNOSIS — R4181 Age-related cognitive decline: Secondary | ICD-10-CM | POA: Diagnosis not present

## 2023-06-28 DIAGNOSIS — R4181 Age-related cognitive decline: Secondary | ICD-10-CM | POA: Diagnosis not present

## 2023-06-29 DIAGNOSIS — R4181 Age-related cognitive decline: Secondary | ICD-10-CM | POA: Diagnosis not present

## 2023-06-30 DIAGNOSIS — R4181 Age-related cognitive decline: Secondary | ICD-10-CM | POA: Diagnosis not present

## 2023-07-01 DIAGNOSIS — R4181 Age-related cognitive decline: Secondary | ICD-10-CM | POA: Diagnosis not present

## 2023-07-02 DIAGNOSIS — R4181 Age-related cognitive decline: Secondary | ICD-10-CM | POA: Diagnosis not present

## 2023-07-03 DIAGNOSIS — R4181 Age-related cognitive decline: Secondary | ICD-10-CM | POA: Diagnosis not present

## 2023-07-04 DIAGNOSIS — R4181 Age-related cognitive decline: Secondary | ICD-10-CM | POA: Diagnosis not present

## 2023-07-05 DIAGNOSIS — R4181 Age-related cognitive decline: Secondary | ICD-10-CM | POA: Diagnosis not present

## 2023-07-06 DIAGNOSIS — R4181 Age-related cognitive decline: Secondary | ICD-10-CM | POA: Diagnosis not present

## 2023-07-08 DIAGNOSIS — R4181 Age-related cognitive decline: Secondary | ICD-10-CM | POA: Diagnosis not present

## 2023-07-09 DIAGNOSIS — R4181 Age-related cognitive decline: Secondary | ICD-10-CM | POA: Diagnosis not present

## 2023-07-10 DIAGNOSIS — R4181 Age-related cognitive decline: Secondary | ICD-10-CM | POA: Diagnosis not present

## 2023-07-11 DIAGNOSIS — R4181 Age-related cognitive decline: Secondary | ICD-10-CM | POA: Diagnosis not present

## 2023-07-12 DIAGNOSIS — E876 Hypokalemia: Secondary | ICD-10-CM | POA: Diagnosis not present

## 2023-07-12 DIAGNOSIS — K259 Gastric ulcer, unspecified as acute or chronic, without hemorrhage or perforation: Secondary | ICD-10-CM | POA: Diagnosis not present

## 2023-07-12 DIAGNOSIS — R221 Localized swelling, mass and lump, neck: Secondary | ICD-10-CM | POA: Diagnosis not present

## 2023-07-12 DIAGNOSIS — R202 Paresthesia of skin: Secondary | ICD-10-CM | POA: Diagnosis not present

## 2023-07-12 DIAGNOSIS — G4700 Insomnia, unspecified: Secondary | ICD-10-CM | POA: Diagnosis not present

## 2023-07-12 DIAGNOSIS — R6889 Other general symptoms and signs: Secondary | ICD-10-CM | POA: Diagnosis not present

## 2023-07-12 DIAGNOSIS — L608 Other nail disorders: Secondary | ICD-10-CM | POA: Diagnosis not present

## 2023-07-12 DIAGNOSIS — E559 Vitamin D deficiency, unspecified: Secondary | ICD-10-CM | POA: Diagnosis not present

## 2023-07-12 DIAGNOSIS — F332 Major depressive disorder, recurrent severe without psychotic features: Secondary | ICD-10-CM | POA: Diagnosis not present

## 2023-07-12 DIAGNOSIS — R4181 Age-related cognitive decline: Secondary | ICD-10-CM | POA: Diagnosis not present

## 2023-07-13 DIAGNOSIS — R4181 Age-related cognitive decline: Secondary | ICD-10-CM | POA: Diagnosis not present

## 2023-07-14 DIAGNOSIS — R4181 Age-related cognitive decline: Secondary | ICD-10-CM | POA: Diagnosis not present

## 2023-07-14 DIAGNOSIS — Z419 Encounter for procedure for purposes other than remedying health state, unspecified: Secondary | ICD-10-CM | POA: Diagnosis not present

## 2023-07-15 DIAGNOSIS — R4181 Age-related cognitive decline: Secondary | ICD-10-CM | POA: Diagnosis not present

## 2023-07-16 DIAGNOSIS — R4181 Age-related cognitive decline: Secondary | ICD-10-CM | POA: Diagnosis not present

## 2023-07-17 DIAGNOSIS — R4181 Age-related cognitive decline: Secondary | ICD-10-CM | POA: Diagnosis not present

## 2023-07-18 DIAGNOSIS — R4181 Age-related cognitive decline: Secondary | ICD-10-CM | POA: Diagnosis not present

## 2023-07-19 DIAGNOSIS — R4181 Age-related cognitive decline: Secondary | ICD-10-CM | POA: Diagnosis not present

## 2023-07-20 DIAGNOSIS — R4181 Age-related cognitive decline: Secondary | ICD-10-CM | POA: Diagnosis not present

## 2023-07-21 DIAGNOSIS — R4181 Age-related cognitive decline: Secondary | ICD-10-CM | POA: Diagnosis not present

## 2023-07-22 DIAGNOSIS — R4181 Age-related cognitive decline: Secondary | ICD-10-CM | POA: Diagnosis not present

## 2023-07-23 DIAGNOSIS — R4181 Age-related cognitive decline: Secondary | ICD-10-CM | POA: Diagnosis not present

## 2023-07-24 DIAGNOSIS — R4181 Age-related cognitive decline: Secondary | ICD-10-CM | POA: Diagnosis not present

## 2023-07-25 DIAGNOSIS — R4181 Age-related cognitive decline: Secondary | ICD-10-CM | POA: Diagnosis not present

## 2023-07-26 DIAGNOSIS — R4181 Age-related cognitive decline: Secondary | ICD-10-CM | POA: Diagnosis not present

## 2023-07-27 DIAGNOSIS — R4181 Age-related cognitive decline: Secondary | ICD-10-CM | POA: Diagnosis not present

## 2023-07-28 DIAGNOSIS — R4181 Age-related cognitive decline: Secondary | ICD-10-CM | POA: Diagnosis not present

## 2023-07-29 DIAGNOSIS — R4181 Age-related cognitive decline: Secondary | ICD-10-CM | POA: Diagnosis not present

## 2023-07-30 ENCOUNTER — Ambulatory Visit: Payer: Medicaid Other | Admitting: Gastroenterology

## 2023-07-30 DIAGNOSIS — R4181 Age-related cognitive decline: Secondary | ICD-10-CM | POA: Diagnosis not present

## 2023-07-31 DIAGNOSIS — R4181 Age-related cognitive decline: Secondary | ICD-10-CM | POA: Diagnosis not present

## 2023-08-01 ENCOUNTER — Ambulatory Visit (INDEPENDENT_AMBULATORY_CARE_PROVIDER_SITE_OTHER): Payer: Medicaid Other | Admitting: Gastroenterology

## 2023-08-01 ENCOUNTER — Encounter: Payer: Self-pay | Admitting: Gastroenterology

## 2023-08-01 ENCOUNTER — Other Ambulatory Visit (INDEPENDENT_AMBULATORY_CARE_PROVIDER_SITE_OTHER): Payer: Medicaid Other

## 2023-08-01 VITALS — BP 132/80 | HR 58 | Ht 60.0 in | Wt 113.6 lb

## 2023-08-01 DIAGNOSIS — Z8711 Personal history of peptic ulcer disease: Secondary | ICD-10-CM | POA: Diagnosis not present

## 2023-08-01 DIAGNOSIS — R1084 Generalized abdominal pain: Secondary | ICD-10-CM

## 2023-08-01 DIAGNOSIS — R4181 Age-related cognitive decline: Secondary | ICD-10-CM | POA: Diagnosis not present

## 2023-08-01 LAB — BASIC METABOLIC PANEL
BUN: 8 mg/dL (ref 6–23)
CO2: 26 mEq/L (ref 19–32)
Calcium: 10.1 mg/dL (ref 8.4–10.5)
Chloride: 106 mEq/L (ref 96–112)
Creatinine, Ser: 0.86 mg/dL (ref 0.40–1.20)
GFR: 76.56 mL/min (ref >60.00)
Glucose, Bld: 96 mg/dL (ref 70–99)
Potassium: 4 mEq/L (ref 3.5–5.1)
Sodium: 142 mEq/L (ref 135–145)

## 2023-08-01 NOTE — Progress Notes (Signed)
08/01/2023 Alice Woodard [id number] 1971/06/10   HISTORY OF PRESENT ILLNESS: This is a 52 year old female from [place] with past medical history of hypertension, hyperlipidemia, vitamin B12 deficiency, hepatic steatosis, and H. pylori duodenal ulcers in 2018.  She is status postcholecystectomy.  She is followed by Dr. Lavon Paganini.    She was seen here earlier this year one of our nurse practitioners for complaints of abdominal pain.  Looks like she ordered a CT scan of the abdomen and pelvis with contrast, but it was never performed.  They tell me that it required authorization and then were never contacted back to reschedule.  They would like to proceed with that.  She describes multifocal abdominal pain, but describes a lot of pain over the right side of her abdomen where her scar is located from previous cholecystectomy.  She was previously on pantoprazole 40 mg daily and famotidine 20 mg at bedtime, but is currently only using both of those as needed.  No issues with moving her bowels for the most pain.  Of note, the patient speaks primarily Nepali so the entire visit was performed via interpreter/translator and her daughter was present for the visit as well and did speak [place].  She underwent an EGD 07/27/2021 which showed non bleeding gastric ulcers and duodenal erosions. Gastric biopsies were negative for H. 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 A repeat EGD in 3 years was recommended.   EGD 07/27/2021: - Z-line regular, 35 cm from the incisors. - No gross lesions in esophagus. - Erythematous mucosa in the antrum and prepyloric region of the stomach. Biopsied. - Non-bleeding gastric ulcers with no stigmata of bleeding. Biopsied. - Duodenal erosions without bleeding. Biopsied. -Recall EGD 3 years  1. Surgical [P], duodenal - PEPTIC DUODENITIS. - NO DYSPLASIA OR MALIGNANCY. 2. Surgical [P], gastric - REACTIVE GASTROPATHY WITH FOCAL INTESTINAL  METAPLASIA. - WARTHIN-STARRY IS NEGATIVE FOR HELICOBACTER PYLORI. - NO DYSPLASIA OR MALIGNANCY.  She underwent a colonoscopy 05/20/2019 which showed 2 tubular adenomatous/hyperplastic polyps removed from the sigmoid and colon and 3 hyperplastic polyps were removed from the rectum.  Bleeding internal hemorrhoids were noted.  She was advised to repeat a colonoscopy in 7 years.  Colonoscopy 05/20/2019: - Two 4 to 6 mm polyps in the sigmoid colon and in the cecum, removed with a cold snare. Resected and retrieved. - Three 1 to 2 mm polyps in the rectum, removed with a cold biopsy forceps. Resected and retrieved. - Non-bleeding internal hemorrhoids. - 7 year recall 1. Surgical [P], cecum, polyp - TUBULAR ADENOMA(S). - NO HIGH GRADE DYSPLASIA OR MALIGNANCY. 2. Surgical [P], sigmoid and rectum, polyp (4) - HYPERPLASTIC POLYP(S). - NO ADENOMATOUS CHANGE OR MALIGNANCY      Past Medical History:  Diagnosis Date   Anemia    History of blood transfusion 04/17/2017   "anemic"   Hyperlipidemia    Hypertension    Past Surgical History:  Procedure Laterality Date   APPENDECTOMY     CHOLECYSTECTOMY OPEN     ESOPHAGOGASTRODUODENOSCOPY N/A 04/18/2017   Procedure: ESOPHAGOGASTRODUODENOSCOPY (EGD);  Surgeon: Napoleon Form, MD;  Location: [facility]OSCOPY;  Service: Endoscopy;  Laterality: N/A;   UPPER GASTROINTESTINAL ENDOSCOPY  04/2017    reports that she has never smoked. She has never used smokeless tobacco. She reports that she does not drink alcohol and does not use drugs. family history includes Cancer - Other in her mother. Allergies  Allergen Reactions   Amlodipine Rash  Outpatient Encounter Medications as of 08/01/2023  Medication Sig   cetirizine (ZYRTEC) 10 MG tablet TAKE 1 TABLET (10 MG TOTAL) BY MOUTH DAILY.   famotidine (PEPCID) 20 MG tablet Take 1 tablet (20 mg total) by mouth at bedtime.   gabapentin (NEURONTIN) 300 MG capsule TAKE 1 CAPSULE (300 MG TOTAL) BY MOUTH AT  BEDTIME.   hydrALAZINE (APRESOLINE) 10 MG tablet Take 10 mg by mouth 3 (three) times daily.   losartan (COZAAR) 100 MG tablet Take 100 mg by mouth.   pantoprazole (PROTONIX) 40 MG tablet Take 1 tablet (40 mg total) by mouth 2 (two) times daily before a meal.   pantoprazole (PROTONIX) 40 MG tablet Take 1 tablet (40 mg total) by mouth daily. Take 30 minutes before breakfast.   traZODone (DESYREL) 50 MG tablet Take 1 tablet (50 mg total) by mouth at bedtime as needed for sleep.   atorvastatin (LIPITOR) 20 MG tablet Take 1 tablet (20 mg total) by mouth daily. (Patient not taking: Reported on 11/29/2022)   carvedilol (COREG) 6.25 MG tablet Take 1 tablet (6.25 mg total) by mouth 2 (two) times daily with a meal. (Patient not taking: Reported on 08/01/2023)   chlorthalidone (HYGROTON) 25 MG tablet Take 1 tablet (25 mg total) by mouth daily. (Patient not taking: Reported on 11/29/2022)   cloNIDine (CATAPRES) 0.1 MG tablet Take 1 tablet (0.1 mg total) by mouth at bedtime. For hot flashes (Patient not taking: Reported on 11/29/2022)   diclofenac Sodium (VOLTAREN) 1 % GEL Apply 4 g topically 4 (four) times daily. (Patient not taking: Reported on 11/29/2022)   diphenhydrAMINE (BENADRYL) 25 MG tablet Take 1 tablet (25 mg total) by mouth every 6 (six) hours as needed for itching. (Patient not taking: Reported on 08/01/2023)   hydrocortisone 2.5 % lotion Apply topically 2 (two) times daily. Apply to affected areas twice daily. (Patient not taking: Reported on 08/01/2023)   hydrOXYzine (ATARAX) 25 MG tablet Take 1 tablet (25 mg total) by mouth at bedtime as needed (insomnia). (Patient not taking: Reported on 11/29/2022)   methylPREDNISolone (MEDROL DOSEPAK) 4 MG TBPK tablet Take as per manufacturer's instructions. (Patient not taking: Reported on 08/01/2023)   potassium chloride SA (KLOR-CON M) 20 MEQ tablet Take 1 tablet (20 mEq total) by mouth daily. (Patient not taking: Reported on 11/29/2022)   tiZANidine (ZANAFLEX) 4 MG  tablet Take 1 tablet (4 mg total) by mouth every 8 (eight) hours as needed for muscle spasms. (Patient not taking: Reported on 11/29/2022)   vitamin B-12 (CYANOCOBALAMIN) 1000 MCG tablet Take 1 tablet (1,000 mcg total) by mouth daily. (Patient not taking: Reported on 11/29/2022)   [DISCONTINUED] polyethylene glycol powder (GLYCOLAX/MIRALAX) 17 GM/SCOOP powder Take 17 g by mouth daily. (Patient not taking: Reported on 11/29/2022)   [DISCONTINUED] triamcinolone cream (KENALOG) 0.1 % Apply 1 application. topically 2 (two) times daily. (Patient not taking: Reported on 11/29/2022)   No facility-administered encounter medications on file as of 08/01/2023.     REVIEW OF SYSTEMS  : All other systems reviewed and negative except where noted in the History of Present Illness.   PHYSICAL EXAM: BP 132/80   Pulse (!) 58   Ht 5' (1.524 m)   Wt 113 lb 9.6 oz (51.5 kg)   LMP 11/15/2014   SpO2 99%   BMI 22.19 kg/m  General: Well developed female in no acute distress Head: Normocephalic and atraumatic Eyes:  Sclerae anicteric, conjunctiva pink. Ears: Normal auditory acuity Lungs: Clear throughout to auscultation; no W/R/R. Heart: Regular  rate and rhythm; no M/R/G. Abdomen: Soft, non-distended.  BS present.  Diffuse TTP, more so on the right near/over the area of her scar from previous cholecystectomy. Musculoskeletal: Symmetrical with no gross deformities  Skin: No lesions on visible extremities Extremities: No edema  Neurological: Alert oriented x 4, grossly non-focal Psychological:  Alert and cooperative. Normal mood and affect  ASSESSMENT AND PLAN: *52 year old female with complaints of chronic abdominal pain, which is multifocal/generalized.  CT of the abdomen and pelvis with contrast was ordered earlier this year, but authorization needed obtained and was never rescheduled.  Will proceed with that CT scan.  Could have scar tissues/adhesions contributing to her pain. *History of H. pylori positive  duodenal ulcers in 2018, treated with eradication of epigastric pain status post EGD 07/2021 that showed nonbleeding gastric ulcers and duodenal erosions.  Gastric biopsies were negative for H. pylori but showed reactive gastropathy with focal intestinal metaplasia so plan for repeat EGD was 07/2024.  Will repeat sooner if needed.  Was previously supposed be taking pantoprazole 40 mg daily and famotidine 20 mg at bedtime.  She is currently only using both of those as needed.  She will resume taking both of those for now. **For hyperplastic polyps and 1 adenomatous polyp on colonoscopy 05/2019, next colonoscopy recommended 05/2026.   CC:  Hoy Register, MD

## 2023-08-01 NOTE — Patient Instructions (Addendum)
??????? ????????? ???????? ?? ?????? ??? ???????????? ????? ???? ?????? ???? ??? ?????? ????? ?? ??????? "B" ??????????? ?????????? ??????? ????? ?????? ??????? ? ?? ????? ???????? ????? ?????????????  ??????? ???? ??? 30-60 ????? ?? ??? Pantoprazole ???: ???? ??????????   ???? ?????? ??? Famotidine ???: ???? ??????????   ??????? ??? ? ?????? ?? CT ??????? ?? ???? [facility], 1st Floor Radiology ?? ????????? ?????? ?? ????? ???????? 08/09/23 ?? 1 ??? ????????? ?? ????? ??????? ???? ??????? ???????????? ??? ????? 15 ????? ????? ???????? ?????    ????? ?????? ????????? ??? ???? ????? ??????????? ????? ?????????:   1) ????? 11 ??? ??? ???? ????????? (?????? ??????? ????? 4 ????? ???)   ??? ?????? ????, ?????? ???? ???????? ?????? ??? ????????? ???? ??? ???? ??? ??????????? ??? ????? ????? ????? ???? ??? ???? ?????????: METFORMIN, GLUCOPHAGE, GLUCOVANCE, AVANDAMET, RIOMET, FORTAMET, ACTOPLUS MET, JANUMET, GLUMETZA ?? METAGLIP, ???????? ??????? ??? 48 ????? ??? ?? ???? ????? ???? ???????   ????? ?????????? ??????? ???????? ?????? ????????? ?????????????? ????? ????? ??? ?????????? ???????? ???? ????? ?????????? ????? ??????? ?????????? ????????? ????? ??????, ??????? ????-?? ??????????/????? ?? ????? ??? ??? ??????? ???? ??????????? ??????? ???????? ??????????? ????????? ???????? ?????? ?????, 45 ????? ?? ???? ????? ???? ?????? ?????? ??? ????? ???????????   ??? ???????? ??????? ????????? ????????? ???? ????????? ??? ?? ???????? ???: ?????? ????? ?????? ? ???, ????? ??????-???????? ????? 8:00 ??? ? 5:00 ????? ????? [phone] ?? [facility] ?? ?? ???? ??????????? Tap?'??k? prad?yakal? tap?'?nl?'? ?ja ch??nu aghi pray?ga??l?k? k?mak? l?gi tahakh?n? taham? j?na anur?dha gar?k? cha. Liph?am?"B" thicnuh?s. Pray?ga??l? b?y?m?k? pahil? ?h?k?m? avasthita cha jaba tap?'?? liph?ab??a b?hira niskanuhuncha.  Bih?nak? kh?j? aghi 30-60 min??a ?ka dina Pantoprazole puna: Suru garnuh?s.   R?ti  sutnu aghi Famotidine puna: Suru garnuh?s.   Tap?'im?k? p??a ra ?r??i k? CT sky?na k? l?g? [facility], 1st Floor Radiology m? nirdh?rita gari'?k? cha. Tap?'?? ?ukrab?ra 08/09/23 m? 1 baj? nirdh?Sharlyne Cai. Tap?'im? dart?k? l?gi tap?'im?k? ap?'in?am?n?a samaya bhand? 15 min??a pahil? ?'ipugnu parcha.    Kr?pay? tap?'?k? par?k??k? dina talak? likhita nird??anahar? p?lan? garnuh?s:   1) Bih?na 11 baj? pachi k?hi nakh?nuh?s (tap?'?k? par?k?a?a bhand? 4 gha??? aghi)   yadi ?va?yaka bha'?m?, tap?'?l? th?rai m?tr?m? p?n?k? s?tha nirdh?rita kunai pani au?adhi lina saknuhuncha. Yadi tap?'im? nimna madhy? kunai pani au?adhi linuhuncha: METFORMIN, GLUCOPHAGE, GLUCOVANCE, AVANDAMET, RIOMET, FORTAMET, ACTOPLUS MET, JANUMET, GLUMETZA v? METAGLIP, tap?'im?l?'? par?k?? pachi 48 gha??? pachi y? au?adhi h?l?a garna bhanincha.   Maukhika kan?r?s?a pi'unuk? udd??ya tap?'?k? ?ndr?k? bhiju'al?'ij??anam? maddata garnu h?. Kan?r?s?a sam?dh?nal? k?h? pakh?l? nimty?'una sakcha. Tap?'im?k? lak?a?ahar?k? vyaktigata s??ak? ?dh?ram?, tap?'im?l? ?ksa-r? kan?r?s?a/??'?k? ?ka nas?m? su'? pani pr?pta garna saknuhuncha. Tap?'?nl? pradar?ana garirahanubha'?k? par?k??k? prak?ram? nirbhara gardai, 45 min??a v? l?m? samayak? l?gi v?sl? la?gam? rahana y?jan? ban?'unuh?s.   Yadi tap?'im?sam?ga tap?'im?k? par?k??k? sambandham? kunai pra?nahar? chan v? tap?'im?l?'? puna: T?lik? ban?'una ?va?yaka cha bhan?, tap?'im? s?mab?ra-?ukrab?ra bih?na 8:00 Baj? ra 5:00 Baj?k? b?cam? [phone] m? [facility] m? kala garna saknuhuncha.  Your provider has requested that you go to the basement level for lab work before leaving today. Press "B" on the elevator. The lab is located at the first door on the left as you exit the elevator.  Restart Pantoprazole once daily 30-60 minutes before breakfast.   Restart Famotidine nightly before bed.   You have been scheduled for a CT scan of the abdomen and pelvis at [facility], 1st  floor Radiology. You are scheduled on Friday 08/09/23 at 1 pm. You should arrive 15 minutes prior to your appointment  time for registration.    Please follow the written instructions below on the day of your exam:   1) Do not eat anything after 11 am (4 hours prior to your test)   You may take any medications as prescribed with a small amount of water, if necessary. If you take any of the following medications: METFORMIN, GLUCOPHAGE, GLUCOVANCE, AVANDAMET, RIOMET, FORTAMET, ACTOPLUS MET, JANUMET, GLUMETZA or METAGLIP, you MAY be asked to HOLD this medication 48 hours AFTER the exam.   The purpose of you drinking the oral contrast is to aid in the visualization of your intestinal tract. The contrast solution may cause some diarrhea. Depending on your individual set of symptoms, you may also receive an intravenous injection of x-ray contrast/dye. Plan on being at [facility] for 45 minutes or longer, depending on the type of exam you are having performed.   If you have any questions regarding your exam or if you need to reschedule, you may call [facility] at [phone] between the hours of 8:00 am and 5:00 pm, Monday-Friday.

## 2023-08-02 DIAGNOSIS — R4181 Age-related cognitive decline: Secondary | ICD-10-CM | POA: Diagnosis not present

## 2023-08-03 DIAGNOSIS — R4181 Age-related cognitive decline: Secondary | ICD-10-CM | POA: Diagnosis not present

## 2023-08-04 DIAGNOSIS — R4181 Age-related cognitive decline: Secondary | ICD-10-CM | POA: Diagnosis not present

## 2023-08-05 DIAGNOSIS — R4181 Age-related cognitive decline: Secondary | ICD-10-CM | POA: Diagnosis not present

## 2023-08-06 DIAGNOSIS — R221 Localized swelling, mass and lump, neck: Secondary | ICD-10-CM | POA: Diagnosis not present

## 2023-08-06 DIAGNOSIS — R4181 Age-related cognitive decline: Secondary | ICD-10-CM | POA: Diagnosis not present

## 2023-08-07 DIAGNOSIS — R4181 Age-related cognitive decline: Secondary | ICD-10-CM | POA: Diagnosis not present

## 2023-08-08 DIAGNOSIS — R4181 Age-related cognitive decline: Secondary | ICD-10-CM | POA: Diagnosis not present

## 2023-08-09 ENCOUNTER — Ambulatory Visit (HOSPITAL_COMMUNITY)
Admission: RE | Admit: 2023-08-09 | Discharge: 2023-08-09 | Disposition: A | Discharge status: Home / Self Care | Payer: Medicaid Other | Source: Ambulatory Visit | Attending: Gastroenterology | Admitting: Gastroenterology

## 2023-08-09 DIAGNOSIS — R1084 Generalized abdominal pain: Secondary | ICD-10-CM | POA: Insufficient documentation

## 2023-08-09 DIAGNOSIS — Z8711 Personal history of peptic ulcer disease: Secondary | ICD-10-CM | POA: Insufficient documentation

## 2023-08-09 DIAGNOSIS — R4181 Age-related cognitive decline: Secondary | ICD-10-CM | POA: Diagnosis not present

## 2023-08-09 DIAGNOSIS — Z9049 Acquired absence of other specified parts of digestive tract: Secondary | ICD-10-CM | POA: Diagnosis not present

## 2023-08-09 DIAGNOSIS — R109 Unspecified abdominal pain: Secondary | ICD-10-CM | POA: Diagnosis not present

## 2023-08-09 DIAGNOSIS — K259 Gastric ulcer, unspecified as acute or chronic, without hemorrhage or perforation: Secondary | ICD-10-CM | POA: Diagnosis not present

## 2023-08-09 MED ORDER — IOHEXOL 9 MG/ML PO SOLN
ORAL | Status: AC
  Filled 2023-08-09: qty 1000

## 2023-08-09 MED ORDER — IOHEXOL 300 MG/ML  SOLN
100.0000 mL | Freq: Once | INTRAMUSCULAR | Status: AC | PRN
  Administered 2023-08-09: 100 mL via INTRAVENOUS

## 2023-08-09 MED ORDER — IOHEXOL 9 MG/ML PO SOLN
500.0000 mL | ORAL | Status: AC
  Administered 2023-08-09 (×2): 500 mL via ORAL

## 2023-08-10 DIAGNOSIS — R4181 Age-related cognitive decline: Secondary | ICD-10-CM | POA: Diagnosis not present

## 2023-08-11 DIAGNOSIS — R4181 Age-related cognitive decline: Secondary | ICD-10-CM | POA: Diagnosis not present

## 2023-08-12 DIAGNOSIS — R4181 Age-related cognitive decline: Secondary | ICD-10-CM | POA: Diagnosis not present

## 2023-08-13 DIAGNOSIS — R4181 Age-related cognitive decline: Secondary | ICD-10-CM | POA: Diagnosis not present

## 2023-08-13 DIAGNOSIS — Z419 Encounter for procedure for purposes other than remedying health state, unspecified: Secondary | ICD-10-CM | POA: Diagnosis not present

## 2023-08-14 DIAGNOSIS — R4181 Age-related cognitive decline: Secondary | ICD-10-CM | POA: Diagnosis not present

## 2023-08-15 DIAGNOSIS — R4181 Age-related cognitive decline: Secondary | ICD-10-CM | POA: Diagnosis not present

## 2023-08-16 DIAGNOSIS — F332 Major depressive disorder, recurrent severe without psychotic features: Secondary | ICD-10-CM | POA: Diagnosis not present

## 2023-08-16 DIAGNOSIS — G4700 Insomnia, unspecified: Secondary | ICD-10-CM | POA: Diagnosis not present

## 2023-08-16 DIAGNOSIS — R4181 Age-related cognitive decline: Secondary | ICD-10-CM | POA: Diagnosis not present

## 2023-08-16 DIAGNOSIS — K259 Gastric ulcer, unspecified as acute or chronic, without hemorrhage or perforation: Secondary | ICD-10-CM | POA: Diagnosis not present

## 2023-08-16 DIAGNOSIS — R202 Paresthesia of skin: Secondary | ICD-10-CM | POA: Diagnosis not present

## 2023-08-16 DIAGNOSIS — I10 Essential (primary) hypertension: Secondary | ICD-10-CM | POA: Diagnosis not present

## 2023-08-16 DIAGNOSIS — Z23 Encounter for immunization: Secondary | ICD-10-CM | POA: Diagnosis not present

## 2023-08-17 DIAGNOSIS — R4181 Age-related cognitive decline: Secondary | ICD-10-CM | POA: Diagnosis not present

## 2023-08-18 DIAGNOSIS — R4181 Age-related cognitive decline: Secondary | ICD-10-CM | POA: Diagnosis not present

## 2023-08-19 DIAGNOSIS — R3981 Functional urinary incontinence: Secondary | ICD-10-CM | POA: Diagnosis not present

## 2023-08-19 DIAGNOSIS — I10 Essential (primary) hypertension: Secondary | ICD-10-CM | POA: Diagnosis not present

## 2023-08-19 DIAGNOSIS — R4181 Age-related cognitive decline: Secondary | ICD-10-CM | POA: Diagnosis not present

## 2023-08-20 DIAGNOSIS — R4181 Age-related cognitive decline: Secondary | ICD-10-CM | POA: Diagnosis not present

## 2023-08-21 DIAGNOSIS — R4181 Age-related cognitive decline: Secondary | ICD-10-CM | POA: Diagnosis not present

## 2023-08-22 DIAGNOSIS — R4181 Age-related cognitive decline: Secondary | ICD-10-CM | POA: Diagnosis not present

## 2023-08-23 DIAGNOSIS — R4181 Age-related cognitive decline: Secondary | ICD-10-CM | POA: Diagnosis not present

## 2023-08-24 DIAGNOSIS — R4181 Age-related cognitive decline: Secondary | ICD-10-CM | POA: Diagnosis not present

## 2023-08-25 DIAGNOSIS — R4181 Age-related cognitive decline: Secondary | ICD-10-CM | POA: Diagnosis not present

## 2023-08-26 ENCOUNTER — Other Ambulatory Visit: Payer: Self-pay

## 2023-08-26 ENCOUNTER — Emergency Department (HOSPITAL_COMMUNITY)
Admission: EM | Admit: 2023-08-26 | Discharge: 2023-08-26 | Disposition: A | Discharge status: Home / Self Care | Payer: Medicaid Other

## 2023-08-26 ENCOUNTER — Encounter (HOSPITAL_COMMUNITY): Payer: Self-pay

## 2023-08-26 ENCOUNTER — Telehealth: Payer: Self-pay | Admitting: *Deleted

## 2023-08-26 ENCOUNTER — Emergency Department (HOSPITAL_COMMUNITY): Payer: Medicaid Other

## 2023-08-26 DIAGNOSIS — Z79899 Other long term (current) drug therapy: Secondary | ICD-10-CM | POA: Diagnosis not present

## 2023-08-26 DIAGNOSIS — R748 Abnormal levels of other serum enzymes: Secondary | ICD-10-CM | POA: Diagnosis not present

## 2023-08-26 DIAGNOSIS — I10 Essential (primary) hypertension: Secondary | ICD-10-CM | POA: Insufficient documentation

## 2023-08-26 DIAGNOSIS — K859 Acute pancreatitis without necrosis or infection, unspecified: Secondary | ICD-10-CM | POA: Insufficient documentation

## 2023-08-26 DIAGNOSIS — D72829 Elevated white blood cell count, unspecified: Secondary | ICD-10-CM | POA: Diagnosis not present

## 2023-08-26 DIAGNOSIS — R109 Unspecified abdominal pain: Secondary | ICD-10-CM | POA: Diagnosis not present

## 2023-08-26 DIAGNOSIS — R1013 Epigastric pain: Secondary | ICD-10-CM | POA: Diagnosis present

## 2023-08-26 DIAGNOSIS — R4181 Age-related cognitive decline: Secondary | ICD-10-CM | POA: Diagnosis not present

## 2023-08-26 LAB — CBC
HCT: 38.6 % (ref 36.0–46.0)
Hemoglobin: 13.2 g/dL (ref 12.0–15.0)
MCH: 30.6 pg (ref 26.0–34.0)
MCHC: 34.2 g/dL (ref 30.0–36.0)
MCV: 89.4 fL (ref 80.0–100.0)
Platelets: 265 10*3/uL (ref 150–400)
RBC: 4.32 MIL/uL (ref 3.87–5.11)
RDW: 12.6 % (ref 11.5–15.5)
WBC: 18 10*3/uL — ABNORMAL HIGH (ref 4.0–10.5)
nRBC: 0 % (ref 0.0–0.2)

## 2023-08-26 LAB — URINALYSIS, ROUTINE W REFLEX MICROSCOPIC
Bilirubin Urine: NEGATIVE
Glucose, UA: NEGATIVE mg/dL
Hgb urine dipstick: NEGATIVE
Ketones, ur: NEGATIVE mg/dL
Nitrite: NEGATIVE
Protein, ur: NEGATIVE mg/dL
Specific Gravity, Urine: 1.015 (ref 1.005–1.030)
pH: 7 (ref 5.0–8.0)

## 2023-08-26 LAB — COMPREHENSIVE METABOLIC PANEL
ALT: 13 U/L (ref 0–44)
AST: 26 U/L (ref 15–41)
Albumin: 3.8 g/dL (ref 3.5–5.0)
Alkaline Phosphatase: 91 U/L (ref 38–126)
Anion gap: 13 (ref 5–15)
BUN: 9 mg/dL (ref 6–20)
CO2: 24 mmol/L (ref 22–32)
Calcium: 9.8 mg/dL (ref 8.9–10.3)
Chloride: 107 mmol/L (ref 98–111)
Creatinine, Ser: 1.15 mg/dL — ABNORMAL HIGH (ref 0.44–1.00)
GFR, Estimated: 57 mL/min — ABNORMAL LOW (ref >60)
Glucose, Bld: 93 mg/dL (ref 70–99)
Potassium: 3.7 mmol/L (ref 3.5–5.1)
Sodium: 144 mmol/L (ref 135–145)
Total Bilirubin: 0.9 mg/dL (ref 0.3–1.2)
Total Protein: 6.9 g/dL (ref 6.5–8.1)

## 2023-08-26 LAB — LIPASE, BLOOD: Lipase: 160 U/L — ABNORMAL HIGH (ref 11–51)

## 2023-08-26 MED ORDER — OXYCODONE-ACETAMINOPHEN 5-325 MG PO TABS
1.0000 | ORAL_TABLET | Freq: Four times a day (QID) | ORAL | 0 refills | Status: DC | PRN
Start: 2023-08-26 — End: 2023-08-26
  Filled 2023-08-26: qty 15, 4d supply, fill #0

## 2023-08-26 MED ORDER — SODIUM CHLORIDE 0.9 % IV BOLUS
500.0000 mL | Freq: Once | INTRAVENOUS | Status: AC
  Administered 2023-08-26: 500 mL via INTRAVENOUS

## 2023-08-26 MED ORDER — ONDANSETRON HCL 4 MG PO TABS
4.0000 mg | ORAL_TABLET | Freq: Four times a day (QID) | ORAL | 0 refills | Status: AC
  Filled 2023-08-26: qty 12, 3d supply, fill #0

## 2023-08-26 MED ORDER — MORPHINE SULFATE (PF) 2 MG/ML IV SOLN
2.0000 mg | Freq: Once | INTRAVENOUS | Status: AC
  Administered 2023-08-26: 2 mg via INTRAVENOUS
  Filled 2023-08-26: qty 1

## 2023-08-26 MED ORDER — ONDANSETRON HCL 4 MG/2ML IJ SOLN
4.0000 mg | Freq: Once | INTRAMUSCULAR | Status: AC
  Administered 2023-08-26: 4 mg via INTRAVENOUS
  Filled 2023-08-26: qty 2

## 2023-08-26 MED ORDER — OXYCODONE-ACETAMINOPHEN 5-325 MG PO TABS
1.0000 | ORAL_TABLET | Freq: Four times a day (QID) | ORAL | 0 refills | Status: DC | PRN
Start: 2023-08-26 — End: 2023-08-28

## 2023-08-26 MED ORDER — IOHEXOL 350 MG/ML SOLN
75.0000 mL | Freq: Once | INTRAVENOUS | Status: AC | PRN
  Administered 2023-08-26: 75 mL via INTRAVENOUS

## 2023-08-26 MED ORDER — OXYCODONE-ACETAMINOPHEN 5-325 MG PO TABS
1.0000 | ORAL_TABLET | Freq: Once | ORAL | Status: AC
  Administered 2023-08-26: 1 via ORAL
  Filled 2023-08-26: qty 1

## 2023-08-26 NOTE — ED Provider Notes (Signed)
[place] EMERGENCY DEPARTMENT AT [facility] Provider Note   CSN: [id number] Arrival date & time: 08/26/23  0341     History  Chief Complaint  Patient presents with   Abdominal Pain    Alice Woodard is a 52 y.o. female.  52 year old female with past medical history of hypertension, hyperlipidemia, and gastric ulcers presenting to the emergency department today with epigastric abdominal pain.  The patient started with this last night around midnight.  She has had a few episodes of nonbloody, nonbilious emesis with this.  She feels very bloated.  She reports having normal bowel movement earlier in the evening.  She denies any fevers.  Denies any associated urinary symptoms.  She was seen by gastroenterology and has had a CT scan recently which was unremarkable.  She is status postcholecystectomy.  She denies any blood in her stool or dark stools.   Abdominal Pain Associated symptoms: nausea and vomiting        Home Medications Prior to Admission medications   Medication Sig Start Date End Date Taking? Authorizing Provider  ondansetron (ZOFRAN) 4 MG tablet Take 1 tablet (4 mg total) by mouth every 6 (six) hours. 08/26/23  Yes Durwin Glaze, MD  oxyCODONE-acetaminophen (PERCOCET/ROXICET) 5-325 MG tablet Take 1 tablet by mouth every 6 (six) hours as needed for severe pain (pain score 7-10). 08/26/23  Yes Durwin Glaze, MD  atorvastatin (LIPITOR) 20 MG tablet Take 1 tablet (20 mg total) by mouth daily. Patient not taking: Reported on 11/29/2022 11/08/21   Claiborne Rigg, NP  carvedilol (COREG) 6.25 MG tablet Take 1 tablet (6.25 mg total) by mouth 2 (two) times daily with a meal. Patient not taking: Reported on 08/01/2023 05/24/22   Hoy Register, MD  cetirizine (ZYRTEC) 10 MG tablet TAKE 1 TABLET (10 MG TOTAL) BY MOUTH DAILY. 11/08/21 08/01/23  Claiborne Rigg, NP  chlorthalidone (HYGROTON) 25 MG tablet Take 1 tablet (25 mg total) by mouth daily. Patient not taking:  Reported on 11/29/2022 09/18/22   Hoy Register, MD  cloNIDine (CATAPRES) 0.1 MG tablet Take 1 tablet (0.1 mg total) by mouth at bedtime. For hot flashes Patient not taking: Reported on 11/29/2022 11/08/21   Claiborne Rigg, NP  diclofenac Sodium (VOLTAREN) 1 % GEL Apply 4 g topically 4 (four) times daily. Patient not taking: Reported on 11/29/2022 09/18/22   Hoy Register, MD  diphenhydrAMINE (BENADRYL) 25 MG tablet Take 1 tablet (25 mg total) by mouth every 6 (six) hours as needed for itching. Patient not taking: Reported on 08/01/2023 05/18/23   Wynetta Fines, MD  famotidine (PEPCID) 20 MG tablet Take 1 tablet (20 mg total) by mouth at bedtime. 11/29/22   Arnaldo Natal, NP  gabapentin (NEURONTIN) 300 MG capsule TAKE 1 CAPSULE (300 MG TOTAL) BY MOUTH AT BEDTIME. 09/13/20 08/01/23  Hoy Register, MD  hydrALAZINE (APRESOLINE) 10 MG tablet Take 10 mg by mouth 3 (three) times daily. 05/24/23   [provider]  hydrocortisone 2.5 % lotion Apply topically 2 (two) times daily. Apply to affected areas twice daily. Patient not taking: Reported on 08/01/2023 07/06/21   Cheryll Cockayne, MD  hydrOXYzine (ATARAX) 25 MG tablet Take 1 tablet (25 mg total) by mouth at bedtime as needed (insomnia). Patient not taking: Reported on 11/29/2022 02/08/22   Hoy Register, MD  losartan (COZAAR) 100 MG tablet Take 100 mg by mouth. 05/03/23   [provider]  methylPREDNISolone (MEDROL DOSEPAK) 4 MG TBPK tablet Take as per  manufacturer's instructions. Patient not taking: Reported on 08/01/2023 05/18/23   Wynetta Fines, MD  pantoprazole (PROTONIX) 40 MG tablet Take 1 tablet (40 mg total) by mouth 2 (two) times daily before a meal. 10/16/22   Arnaldo Natal, NP  pantoprazole (PROTONIX) 40 MG tablet Take 1 tablet (40 mg total) by mouth daily. Take 30 minutes before breakfast. 11/29/22   Arnaldo Natal, NP  potassium chloride SA (KLOR-CON M) 20 MEQ tablet Take 1 tablet (20 mEq total)  by mouth daily. Patient not taking: Reported on 11/29/2022 09/19/22   Hoy Register, MD  tiZANidine (ZANAFLEX) 4 MG tablet Take 1 tablet (4 mg total) by mouth every 8 (eight) hours as needed for muscle spasms. Patient not taking: Reported on 11/29/2022 05/24/22   Hoy Register, MD  traZODone (DESYREL) 50 MG tablet Take 1 tablet (50 mg total) by mouth at bedtime as needed for sleep. 09/18/22   Hoy Register, MD  vitamin B-12 (CYANOCOBALAMIN) 1000 MCG tablet Take 1 tablet (1,000 mcg total) by mouth daily. Patient not taking: Reported on 11/29/2022 05/24/22   Hoy Register, MD      Allergies    Amlodipine    Review of Systems   Review of Systems  Gastrointestinal:  Positive for abdominal pain, nausea and vomiting.  All other systems reviewed and are negative.   Physical Exam Updated Vital Signs BP (!) 154/91   Pulse (!) 54   Temp 98.1 F (36.7 C) (Oral)   Resp 18   Ht 5' (1.524 m)   Wt 51.7 kg   LMP 11/15/2014   SpO2 100%   BMI 22.26 kg/m  Physical Exam Vitals and nursing note reviewed.   Gen: NAD Eyes: PERRL, EOMI HEENT: no oropharyngeal swelling Neck: trachea midline Resp: clear to auscultation bilaterally Card: RRR, no murmurs, rubs, or gallops Abd: Tender over the epigastrium with guarding to deep palpation Extremities: no calf tenderness, no edema Vascular: 2+ radial pulses bilaterally, 2+ DP pulses bilaterally Skin: no rashes Psyc: acting appropriately   ED Results / Procedures / Treatments   Labs (all labs ordered are listed, but only abnormal results are displayed) Labs Reviewed  LIPASE, BLOOD - Abnormal; Notable for the following components:      Result Value   Lipase 160 (*)    All other components within normal limits  COMPREHENSIVE METABOLIC PANEL - Abnormal; Notable for the following components:   Creatinine, Ser 1.15 (*)    GFR, Estimated 57 (*)    All other components within normal limits  CBC - Abnormal; Notable for the following components:    WBC 18.0 (*)    All other components within normal limits  URINALYSIS, ROUTINE W REFLEX MICROSCOPIC - Abnormal; Notable for the following components:   APPearance HAZY (*)    Leukocytes,Ua SMALL (*)    Bacteria, UA RARE (*)    All other components within normal limits    EKG None  Radiology CT ABDOMEN PELVIS W CONTRAST  Result Date: 08/26/2023 CLINICAL DATA:  Acute abdominal pain. Leukocytosis. Elevated lipase. EXAM: CT ABDOMEN AND PELVIS WITH CONTRAST TECHNIQUE: Multidetector CT imaging of the abdomen and pelvis was performed using the standard protocol following bolus administration of intravenous contrast. RADIATION DOSE REDUCTION: This exam was performed according to the departmental dose-optimization program which includes automated exposure control, adjustment of the mA and/or kV according to patient size and/or use of iterative reconstruction technique. CONTRAST:  75mL OMNIPAQUE IOHEXOL 350 MG/ML SOLN COMPARISON:  08/09/2023 FINDINGS: Lower Chest: No acute  findings. Hepatobiliary: No suspicious hepatic masses identified. Prior cholecystectomy. No evidence of biliary obstruction. Pancreas: Normal appearance. No evidence of pancreatitis, pancreatic mass or ductal dilatation. Spleen: Within normal limits in size and appearance. Adrenals/Urinary Tract: No suspicious masses identified. No evidence of ureteral calculi or hydronephrosis. Stomach/Bowel: No evidence of obstruction, inflammatory process or abnormal fluid collections. Normal appendix visualized. Vascular/Lymphatic: No pathologically enlarged lymph nodes. No acute vascular findings. Reproductive:  No mass or other significant abnormality. Other:  None. Musculoskeletal:  No suspicious bone lesions identified. IMPRESSION: No acute findings or other significant abnormality. Electronically Signed   By: Danae Orleans M.D.   On: 08/26/2023 09:17    Procedures Procedures    Medications Ordered in ED Medications  morphine (PF) 2 MG/ML  injection 2 mg (2 mg Intravenous Given 08/26/23 0724)  ondansetron (ZOFRAN) injection 4 mg (4 mg Intravenous Given 08/26/23 0724)  sodium chloride 0.9 % bolus 500 mL (0 mLs Intravenous Stopped 08/26/23 1004)  iohexol (OMNIPAQUE) 350 MG/ML injection 75 mL (75 mLs Intravenous Contrast Given 08/26/23 0832)  oxyCODONE-acetaminophen (PERCOCET/ROXICET) 5-325 MG per tablet 1 tablet (1 tablet Oral Given 08/26/23 1157)    ED Course/ Medical Decision Making/ A&P                                 Medical Decision Making 52 year old female presents emergency department today with epigastric abdominal pain.  I will further evaluate patient here with basic labs as well as LFTs and lipase to evaluate for hepatobiliary otology or pancreatitis.  Will also obtain a urinalysis to evaluate for pyelonephritis.  I will give patient morphine and Zofran for symptom as well as IV fluids.  Will reevaluate for ultimate disposition.  The patient's lipase is elevated here.  She also has a leukocytosis with a white count of 18,000.  Will repeat CT imaging on the patient here to evaluate for acute pancreatitis or other intra-abdominal pathology.  The patient's workup here was consistent with acute pancreatitis.  She does have a leukocytosis.  CT scan imaging was ordered and this did not show any acute findings.  Her symptoms were well-controlled with oral Percocet here after initial dose of morphine.  I did call and discuss her case with GI who recommends outpatient follow-up.  She will be discharged with return precautions and they will schedule close follow-up for her.  She is in agreement this plan.  Interpreter services were used for interview and go results with the patient.  Amount and/or Complexity of Data Reviewed Labs: ordered. Radiology: ordered.  Risk Prescription drug management.           Final Clinical Impression(s) / ED Diagnoses Final diagnoses:  Acute pancreatitis, unspecified complication  status, unspecified pancreatitis type    Rx / DC Orders ED Discharge Orders          Ordered    oxyCODONE-acetaminophen (PERCOCET/ROXICET) 5-325 MG tablet  Every 6 hours PRN        08/26/23 1342    ondansetron (ZOFRAN) 4 MG tablet  Every 6 hours        08/26/23 1342              Durwin Glaze, MD 08/26/23 1343

## 2023-08-26 NOTE — Discharge Instructions (Addendum)
Your workup today shows pancreatitis.  Please follow-up with your GI doctor.  You should receive a call.  If you do not please call to schedule a follow-up appointment.  Return to the ER for worsening symptoms.  Take the Percocet as needed for pain.  Do not drive or drink alcohol while taking this as it may make you drowsy.  Take the Zofran as needed for nausea.  If your symptoms worsen please return to the ER for reevaluation.

## 2023-08-26 NOTE — ED Notes (Signed)
Pt given water, pt tolerated well.

## 2023-08-26 NOTE — Telephone Encounter (Signed)
Called patient and spoke with daughter to make appt with patient for 2 weeks. Will need to call back on Oct 21st due to 7 day hold, to make appt for Oct. 28th, 2024. Informed the daughter I will be calling back on the 21st and daughter agreed.

## 2023-08-26 NOTE — Telephone Encounter (Signed)
-----   Message from Unk Lightning sent at 08/26/2023 10:45 AM EDT ----- Regarding: follow up Needs follow up with APP in next 1-2 weeks for epigastric pain and recent ER visit. Thanks-JLL

## 2023-08-26 NOTE — ED Triage Notes (Signed)
Pt complaining of abdominal pain in the rt upper and epigastric region. She has been vomiting and she said she feels bloated. Has been to GI doctor for similar episodes and had a ct scan done. They have not received any results from it.

## 2023-08-27 ENCOUNTER — Encounter (HOSPITAL_COMMUNITY): Payer: Self-pay | Admitting: Emergency Medicine

## 2023-08-27 ENCOUNTER — Other Ambulatory Visit: Payer: Self-pay

## 2023-08-27 ENCOUNTER — Emergency Department (HOSPITAL_COMMUNITY)
Admission: EM | Admit: 2023-08-27 | Discharge: 2023-08-28 | Disposition: A | Discharge status: Home / Self Care | Payer: Medicaid Other | Attending: Emergency Medicine | Admitting: Emergency Medicine

## 2023-08-27 DIAGNOSIS — I10 Essential (primary) hypertension: Secondary | ICD-10-CM

## 2023-08-27 DIAGNOSIS — K5900 Constipation, unspecified: Secondary | ICD-10-CM

## 2023-08-27 DIAGNOSIS — R1013 Epigastric pain: Secondary | ICD-10-CM | POA: Diagnosis not present

## 2023-08-27 DIAGNOSIS — R4181 Age-related cognitive decline: Secondary | ICD-10-CM | POA: Diagnosis not present

## 2023-08-27 DIAGNOSIS — Z79899 Other long term (current) drug therapy: Secondary | ICD-10-CM | POA: Insufficient documentation

## 2023-08-27 LAB — COMPREHENSIVE METABOLIC PANEL
ALT: 14 U/L (ref 0–44)
AST: 25 U/L (ref 15–41)
Albumin: 3.6 g/dL (ref 3.5–5.0)
Alkaline Phosphatase: 88 U/L (ref 38–126)
Anion gap: 11 (ref 5–15)
BUN: 8 mg/dL (ref 6–20)
CO2: 25 mmol/L (ref 22–32)
Calcium: 9.1 mg/dL (ref 8.9–10.3)
Chloride: 104 mmol/L (ref 98–111)
Creatinine, Ser: 0.87 mg/dL (ref 0.44–1.00)
GFR, Estimated: >60 mL/min (ref >60)
Glucose, Bld: 85 mg/dL (ref 70–99)
Potassium: 3.8 mmol/L (ref 3.5–5.1)
Sodium: 140 mmol/L (ref 135–145)
Total Bilirubin: 1.5 mg/dL — ABNORMAL HIGH (ref 0.3–1.2)
Total Protein: 6.7 g/dL (ref 6.5–8.1)

## 2023-08-27 LAB — URINALYSIS, ROUTINE W REFLEX MICROSCOPIC
Bilirubin Urine: NEGATIVE
Glucose, UA: NEGATIVE mg/dL
Hgb urine dipstick: NEGATIVE
Ketones, ur: NEGATIVE mg/dL
Nitrite: NEGATIVE
Protein, ur: NEGATIVE mg/dL
Specific Gravity, Urine: 1.01 (ref 1.005–1.030)
pH: 7 (ref 5.0–8.0)

## 2023-08-27 LAB — CBC
HCT: 34.1 % — ABNORMAL LOW (ref 36.0–46.0)
Hemoglobin: 11.7 g/dL — ABNORMAL LOW (ref 12.0–15.0)
MCH: 30.9 pg (ref 26.0–34.0)
MCHC: 34.3 g/dL (ref 30.0–36.0)
MCV: 90 fL (ref 80.0–100.0)
Platelets: 232 10*3/uL (ref 150–400)
RBC: 3.79 MIL/uL — ABNORMAL LOW (ref 3.87–5.11)
RDW: 12.6 % (ref 11.5–15.5)
WBC: 7.8 10*3/uL (ref 4.0–10.5)
nRBC: 0 % (ref 0.0–0.2)

## 2023-08-27 LAB — LIPASE, BLOOD: Lipase: 37 U/L (ref 11–51)

## 2023-08-27 MED ORDER — FAMOTIDINE 20 MG PO TABS
20.0000 mg | ORAL_TABLET | Freq: Once | ORAL | Status: AC
  Administered 2023-08-27: 20 mg via ORAL
  Filled 2023-08-27: qty 1

## 2023-08-27 MED ORDER — ALUM & MAG HYDROXIDE-SIMETH 200-200-20 MG/5ML PO SUSP
30.0000 mL | Freq: Once | ORAL | Status: AC
  Administered 2023-08-27: 30 mL via ORAL
  Filled 2023-08-27: qty 30

## 2023-08-27 NOTE — ED Triage Notes (Signed)
Patient arrives ambulatory by POV with daughter stating patient has not had a BM since yesterday. Patient was seen here yesterday and diagnosed with pancreatitis and started on percocet. Patient had CT scan and labs done yesterday.

## 2023-08-27 NOTE — ED Notes (Signed)
Called lab to add on urine culture ?

## 2023-08-28 DIAGNOSIS — R4181 Age-related cognitive decline: Secondary | ICD-10-CM | POA: Diagnosis not present

## 2023-08-28 LAB — TROPONIN I (HIGH SENSITIVITY): Troponin I (High Sensitivity): 6 ng/L (ref <18)

## 2023-08-28 MED ORDER — PANTOPRAZOLE SODIUM 40 MG PO TBEC: 40.0000 mg | DELAYED_RELEASE_TABLET | Freq: Every day | ORAL | 0 refills | Status: DC

## 2023-08-28 MED ORDER — POLYETHYLENE GLYCOL 3350 17 G PO PACK: 17.0000 g | PACK | Freq: Every day | ORAL | 0 refills | Status: AC

## 2023-08-28 MED ORDER — ALUM & MAG HYDROXIDE-SIMETH 400-400-40 MG/5ML PO SUSP: 15.0000 mL | Freq: Four times a day (QID) | ORAL | 0 refills | Status: AC | PRN

## 2023-08-28 MED ORDER — DOCUSATE SODIUM 100 MG PO CAPS: 100.0000 mg | ORAL_CAPSULE | Freq: Two times a day (BID) | ORAL | 0 refills | Status: AC

## 2023-08-28 MED ORDER — MAGNESIUM CITRATE PO SOLN: 1.0000 | Freq: Once | ORAL | 2 refills | Status: AC

## 2023-08-28 NOTE — Discharge Instructions (Addendum)
There are multiple ways to improve your constipation, some options are listed below and all involve over the counter medications:   * You can try the magnesium citrate 1 bottle per day until you get results. * You can try an enema once daily as needed.  * Generally, you need to increase your fluid intake and take stool softeners twice a day until you follow-up with your primary doctor for further management. * MiraLAX is an osmotic laxative. This means that it will keep electrolytes and water in your stool and allow you to have easier bowel movements.        * One option is to take 8 capfuls of MiraLAX and put in a 32 ounce Gatorade and shake it up.  Drink 4-6 ounces per hour until you have results.  Another way is per the directions below.       * You  can take this medication up to 3 times daily as needed produce bowel movements. However while you're taking this much MiraLAX you need to make sure you are replenishing her electrolytes and water loss. He should also not take it more than 2 days in a row at this level. Generally I recommend people workup to this. For example take 1 capful of MiraLAX once a day for 2-3 days and if you do not get improvement in your bowel habits then take 1 capful twice a day for 2-3 days and if they don't get relief from this and you can take 1 capful 3 times a day for 2 days. If you start having diarrhea, decrease the amount of MiraLAX you are using until you have soft formed stools once to twice a day. Remember during this process that you need to increase your electrolytes and water intake, so oftentimes sports drinks are good for this. Some people end up taking half or one whole capful daily for the rest of her lives to have regular bowel movements you can do this as you feel is proper.  ??????? ??????? ????? ???? ???? ???????? ???, ???? ????????? ?? ???????? ??? ? ??? ??????? ??????? ?????? ???:   * ?????? ????? ??????? ???????????? ???????? 1 ???? ????? ??? ?????? ????  ??????????? * ??????? ???????? ?????? ????? ?? ??? ????? ?????? ???? ???????????  * ??????????, ??????? ????? ??? ???????? ???? ?????? ???? ? ????? ??? ??? ????? ????????? ???? ???? ?????? ??????? ?? ???????????? ???? ????? ???????? ????????? ???-?? ??????????? * MiraLAX ?? ???????? ???? ??? ???? ???? ?? ?? ?? ???? ??????? ???? ??????????????? ? ???? ????? ? ???????? ????? ?? ????? ???? ?????? ??????        * ???? ?????? MiraLAX ?? 8 ???????? ???? ? 32 ??? ????????? ?????? ???  ????? ??????? ????? ????? 4-6 ??? ??????????  ????? ????? ???? ???????? ?????? ???       * ??????? ?? ???? ?????? ? ??????? ??? ?????????? ??? ????????? ??? ??????? ?????? ?????? ?????? ??? ???? MiraLAX ??????????? ???? ?????? ???? ??????????????? ? ?????? ?????? ????? ????????? ???? ????????? ???? ?????? ?? ???? ?? ?????? ?????? 2 ??? ????? ??? ???? ??????? ?????????? ? ??????????? ?? ??? ???? ??????? ??????? ???????? ???? 2-3 ????? ???? ????? ?? ??? MiraLAX ?? 1 ?? ???????? ? ??? ?????? ????????? ?????? ????? ?????? ??? 2-3 ????? ???? 1 ???????? ????? ??? ??? ???????? ? ??? ????????? ????? ???? ?????? ? ????? ? ????? ???? ????? ? ??? ? ???????? ??? ??????? ??? ???????? ????? ????? ????? ???, ??????? ?????? ??????????? MiraLAX ?? ?????? ?????????? ?????? ???????? ??? ??????? ???? ????? ?? ?? ??? ??? ?????? ?? ??????????? ?????? ??? ????????? ?? ??????? ????? ??????????????? ? ?????? ???? ????? ?????? ?, ??????? ?????: ?????? ?????? ???? ???? ?????? ???????? ????? ???????????? ????? ????? ?????? ???? ????? ??? ?? ?? ???? ???????? ???? ?????? ??????? ????? ?????? ??? ??????? ???? ????? ???? ???????????

## 2023-08-28 NOTE — ED Notes (Signed)
Patient provided applesauce to eat.

## 2023-08-28 NOTE — ED Provider Notes (Signed)
[place] EMERGENCY DEPARTMENT AT [facility] Provider Note   CSN: [id number] Arrival date & time: 08/27/23  1745     History {Add pertinent medical, surgical, social history, OB history to HPI:1} Chief Complaint  Patient presents with   Constipation   Abdominal Pain    Alice Woodard is a 52 y.o. female.   Constipation Associated symptoms: abdominal pain   Abdominal Pain Associated symptoms: constipation        Home Medications Prior to Admission medications   Medication Sig Start Date End Date Taking? Authorizing Provider  alum & mag hydroxide-simeth (MAALOX PLUS) 400-400-40 MG/5ML suspension Take 15 mLs by mouth every 6 (six) hours as needed for indigestion. 08/28/23  Yes Omaira Mellen, Barbara Cower, MD  docusate sodium (COLACE) 100 MG capsule Take 1 capsule (100 mg total) by mouth every 12 (twelve) hours. 08/28/23  Yes Phylisha Dix, Barbara Cower, MD  magnesium citrate SOLN Take 296 mLs (1 Bottle total) by mouth once for 1 dose. 08/28/23 08/28/23 Yes Layanna Charo, Barbara Cower, MD  pantoprazole (PROTONIX) 40 MG tablet Take 1 tablet (40 mg total) by mouth daily. 08/28/23  Yes Hayven Fatima, Barbara Cower, MD  polyethylene glycol (MIRALAX / GLYCOLAX) 17 g packet Take 17 g by mouth daily. 08/28/23  Yes Macintyre Alexa, Barbara Cower, MD  cetirizine (ZYRTEC) 10 MG tablet TAKE 1 TABLET (10 MG TOTAL) BY MOUTH DAILY. 11/08/21 08/01/23  Claiborne Rigg, NP  famotidine (PEPCID) 20 MG tablet Take 1 tablet (20 mg total) by mouth at bedtime. 11/29/22   Arnaldo Natal, NP  gabapentin (NEURONTIN) 300 MG capsule TAKE 1 CAPSULE (300 MG TOTAL) BY MOUTH AT BEDTIME. 09/13/20 08/01/23  Hoy Register, MD  hydrALAZINE (APRESOLINE) 10 MG tablet Take 10 mg by mouth 3 (three) times daily. 05/24/23   [provider]  losartan (COZAAR) 100 MG tablet Take 100 mg by mouth. 05/03/23   [provider]  ondansetron (ZOFRAN) 4 MG tablet Take 1 tablet (4 mg total) by mouth every 6 (six) hours. 08/26/23   Durwin Glaze, MD  traZODone  (DESYREL) 50 MG tablet Take 1 tablet (50 mg total) by mouth at bedtime as needed for sleep. 09/18/22   Hoy Register, MD      Allergies    Amlodipine    Review of Systems   Review of Systems  Gastrointestinal:  Positive for abdominal pain and constipation.    Physical Exam Updated Vital Signs BP (!) 144/91   Pulse (!) 54   Temp 98.6 F (37 C)   Resp 17   Ht 5' (1.524 m)   Wt 51.7 kg   LMP 11/15/2014   SpO2 95%   BMI 22.26 kg/m  Physical Exam  ED Results / Procedures / Treatments   Labs (all labs ordered are listed, but only abnormal results are displayed) Labs Reviewed  COMPREHENSIVE METABOLIC PANEL - Abnormal; Notable for the following components:      Result Value   Total Bilirubin 1.5 (*)    All other components within normal limits  CBC - Abnormal; Notable for the following components:   RBC 3.79 (*)    Hemoglobin 11.7 (*)    HCT 34.1 (*)    All other components within normal limits  URINALYSIS, ROUTINE W REFLEX MICROSCOPIC - Abnormal; Notable for the following components:   Leukocytes,Ua LARGE (*)    Bacteria, UA RARE (*)    All other components within normal limits  URINE CULTURE  LIPASE, BLOOD  TROPONIN I (HIGH SENSITIVITY)    EKG EKG Interpretation Date/Time:  Tuesday August 27 2023 23:42:33 EDT Ventricular Rate:  56 PR Interval:  158 QRS Duration:  88 QT Interval:  521 QTC Calculation: 503 R Axis:   37  Text Interpretation: Sinus rhythm Borderline T abnormalities, anterior leads Borderline prolonged QT interval No significant change since last tracing Confirmed by Marily Memos [phone]) on 08/28/2023 1:14:42 AM  Radiology CT ABDOMEN PELVIS W CONTRAST  Result Date: 08/26/2023 CLINICAL DATA:  Acute abdominal pain. Leukocytosis. Elevated lipase. EXAM: CT ABDOMEN AND PELVIS WITH CONTRAST TECHNIQUE: Multidetector CT imaging of the abdomen and pelvis was performed using the standard protocol following bolus administration of intravenous contrast.  RADIATION DOSE REDUCTION: This exam was performed according to the departmental dose-optimization program which includes automated exposure control, adjustment of the mA and/or kV according to patient size and/or use of iterative reconstruction technique. CONTRAST:  75mL OMNIPAQUE IOHEXOL 350 MG/ML SOLN COMPARISON:  08/09/2023 FINDINGS: Lower Chest: No acute findings. Hepatobiliary: No suspicious hepatic masses identified. Prior cholecystectomy. No evidence of biliary obstruction. Pancreas: Normal appearance. No evidence of pancreatitis, pancreatic mass or ductal dilatation. Spleen: Within normal limits in size and appearance. Adrenals/Urinary Tract: No suspicious masses identified. No evidence of ureteral calculi or hydronephrosis. Stomach/Bowel: No evidence of obstruction, inflammatory process or abnormal fluid collections. Normal appendix visualized. Vascular/Lymphatic: No pathologically enlarged lymph nodes. No acute vascular findings. Reproductive:  No mass or other significant abnormality. Other:  None. Musculoskeletal:  No suspicious bone lesions identified. IMPRESSION: No acute findings or other significant abnormality. Electronically Signed   By: Danae Orleans M.D.   On: 08/26/2023 09:17    Procedures Procedures  {Document cardiac monitor, telemetry assessment procedure when appropriate:1}  Medications Ordered in ED Medications  alum & mag hydroxide-simeth (MAALOX/MYLANTA) 200-200-20 MG/5ML suspension 30 mL (30 mLs Oral Given 08/27/23 2348)  famotidine (PEPCID) tablet 20 mg (20 mg Oral Given 08/27/23 2348)    ED Course/ Medical Decision Making/ A&P   {   Click here for ABCD2, HEART and other calculatorsREFRESH Note before signing :1}                              Medical Decision Making Amount and/or Complexity of Data Reviewed Labs: ordered. ECG/medicine tests: ordered.  Risk OTC drugs. Prescription drug management.   ***  {Document critical care time when  appropriate:1} {Document review of labs and clinical decision tools ie heart score, Chads2Vasc2 etc:1}  {Document your independent review of radiology images, and any outside records:1} {Document your discussion with family members, caretakers, and with consultants:1} {Document social determinants of health affecting pt's care:1} {Document your decision making why or why not admission, treatments were needed:1} Final Clinical Impression(s) / ED Diagnoses Final diagnoses:  Epigastric pain  Hypertension, unspecified type  Constipation, unspecified constipation type    Rx / DC Orders ED Discharge Orders          Ordered    polyethylene glycol (MIRALAX / GLYCOLAX) 17 g packet  Daily        08/28/23 0340    docusate sodium (COLACE) 100 MG capsule  Every 12 hours        08/28/23 0340    magnesium citrate SOLN   Once        08/28/23 0340    pantoprazole (PROTONIX) 40 MG tablet  Daily        08/28/23 0340    alum & mag hydroxide-simeth (MAALOX PLUS) 400-400-40 MG/5ML suspension  Every 6 hours PRN  08/28/23 0340            

## 2023-08-29 DIAGNOSIS — R4181 Age-related cognitive decline: Secondary | ICD-10-CM | POA: Diagnosis not present

## 2023-08-29 LAB — URINE CULTURE: Culture: <10000 — AB

## 2023-08-29 NOTE — Telephone Encounter (Signed)
Inbound call from patients daughter requesting an appointment in the next 2 weeks. I advised her that we do not have anything that soon. She stated her Mom had been to the ER and they wanted her to follow up with [place] in the next 2 weeks. Requesting a call from the nurse to discuss. Please advise.

## 2023-08-30 ENCOUNTER — Emergency Department (HOSPITAL_COMMUNITY)
Admission: EM | Admit: 2023-08-30 | Discharge: 2023-08-30 | Disposition: A | Discharge status: Home / Self Care | Payer: Medicaid Other | Attending: Emergency Medicine | Admitting: Emergency Medicine

## 2023-08-30 ENCOUNTER — Other Ambulatory Visit: Payer: Self-pay

## 2023-08-30 ENCOUNTER — Encounter (HOSPITAL_COMMUNITY): Payer: Self-pay | Admitting: Emergency Medicine

## 2023-08-30 DIAGNOSIS — Z79899 Other long term (current) drug therapy: Secondary | ICD-10-CM | POA: Diagnosis not present

## 2023-08-30 DIAGNOSIS — R1084 Generalized abdominal pain: Secondary | ICD-10-CM | POA: Diagnosis not present

## 2023-08-30 DIAGNOSIS — R109 Unspecified abdominal pain: Secondary | ICD-10-CM | POA: Diagnosis present

## 2023-08-30 DIAGNOSIS — D72829 Elevated white blood cell count, unspecified: Secondary | ICD-10-CM | POA: Diagnosis not present

## 2023-08-30 DIAGNOSIS — R1013 Epigastric pain: Secondary | ICD-10-CM

## 2023-08-30 DIAGNOSIS — I10 Essential (primary) hypertension: Secondary | ICD-10-CM | POA: Diagnosis not present

## 2023-08-30 DIAGNOSIS — E876 Hypokalemia: Secondary | ICD-10-CM | POA: Diagnosis not present

## 2023-08-30 DIAGNOSIS — R4181 Age-related cognitive decline: Secondary | ICD-10-CM | POA: Diagnosis not present

## 2023-08-30 LAB — URINALYSIS, ROUTINE W REFLEX MICROSCOPIC
Bilirubin Urine: NEGATIVE
Glucose, UA: NEGATIVE mg/dL
Hgb urine dipstick: NEGATIVE
Ketones, ur: NEGATIVE mg/dL
Nitrite: NEGATIVE
Protein, ur: NEGATIVE mg/dL
Specific Gravity, Urine: 1.012 (ref 1.005–1.030)
pH: 6 (ref 5.0–8.0)

## 2023-08-30 LAB — COMPREHENSIVE METABOLIC PANEL
ALT: 16 U/L (ref 0–44)
AST: 26 U/L (ref 15–41)
Albumin: 3.9 g/dL (ref 3.5–5.0)
Alkaline Phosphatase: 88 U/L (ref 38–126)
Anion gap: 11 (ref 5–15)
BUN: 8 mg/dL (ref 6–20)
CO2: 21 mmol/L — ABNORMAL LOW (ref 22–32)
Calcium: 9.3 mg/dL (ref 8.9–10.3)
Chloride: 108 mmol/L (ref 98–111)
Creatinine, Ser: 0.8 mg/dL (ref 0.44–1.00)
GFR, Estimated: >60 mL/min (ref >60)
Glucose, Bld: 107 mg/dL — ABNORMAL HIGH (ref 70–99)
Potassium: 3.3 mmol/L — ABNORMAL LOW (ref 3.5–5.1)
Sodium: 140 mmol/L (ref 135–145)
Total Bilirubin: 1.1 mg/dL (ref 0.3–1.2)
Total Protein: 7.3 g/dL (ref 6.5–8.1)

## 2023-08-30 LAB — CBC
HCT: 37.7 % (ref 36.0–46.0)
Hemoglobin: 13 g/dL (ref 12.0–15.0)
MCH: 30.4 pg (ref 26.0–34.0)
MCHC: 34.5 g/dL (ref 30.0–36.0)
MCV: 88.1 fL (ref 80.0–100.0)
Platelets: 277 10*3/uL (ref 150–400)
RBC: 4.28 MIL/uL (ref 3.87–5.11)
RDW: 12.6 % (ref 11.5–15.5)
WBC: 16.3 10*3/uL — ABNORMAL HIGH (ref 4.0–10.5)
nRBC: 0 % (ref 0.0–0.2)

## 2023-08-30 LAB — LIPASE, BLOOD: Lipase: 55 U/L — ABNORMAL HIGH (ref 11–51)

## 2023-08-30 MED ORDER — SUCRALFATE 1 G PO TABS
1.0000 g | ORAL_TABLET | Freq: Three times a day (TID) | ORAL | 1 refills | Status: DC
  Filled 2023-08-30: qty 60, 15d supply, fill #0

## 2023-08-30 MED ORDER — ONDANSETRON HCL 4 MG/2ML IJ SOLN
4.0000 mg | Freq: Once | INTRAMUSCULAR | Status: AC
  Administered 2023-08-30: 4 mg via INTRAVENOUS
  Filled 2023-08-30: qty 2

## 2023-08-30 MED ORDER — OXYCODONE-ACETAMINOPHEN 5-325 MG PO TABS: 1.0000 | ORAL_TABLET | Freq: Four times a day (QID) | ORAL | 0 refills | Status: AC | PRN

## 2023-08-30 MED ORDER — ONDANSETRON 4 MG PO TBDP
4.0000 mg | ORAL_TABLET | Freq: Three times a day (TID) | ORAL | 0 refills | Status: AC | PRN
Start: 2023-08-30 — End: ?

## 2023-08-30 MED ORDER — PANTOPRAZOLE SODIUM 40 MG IV SOLR
40.0000 mg | Freq: Once | INTRAVENOUS | Status: AC
  Administered 2023-08-30: 40 mg via INTRAVENOUS
  Filled 2023-08-30 (×2): qty 10

## 2023-08-30 MED ORDER — MORPHINE SULFATE (PF) 4 MG/ML IV SOLN
4.0000 mg | Freq: Once | INTRAVENOUS | Status: AC
  Administered 2023-08-30: 4 mg via INTRAVENOUS
  Filled 2023-08-30: qty 1

## 2023-08-30 MED ORDER — OXYCODONE-ACETAMINOPHEN 5-325 MG PO TABS
1.0000 | ORAL_TABLET | Freq: Four times a day (QID) | ORAL | 0 refills | Status: DC | PRN
Start: 2023-08-30 — End: 2023-08-30
  Filled 2023-08-30: qty 10, 3d supply, fill #0

## 2023-08-30 MED ORDER — ONDANSETRON 4 MG PO TBDP
4.0000 mg | ORAL_TABLET | Freq: Three times a day (TID) | ORAL | 0 refills | Status: DC | PRN
  Filled 2023-08-30: qty 10, 4d supply, fill #0

## 2023-08-30 MED ORDER — SUCRALFATE 1 G PO TABS
1.0000 g | ORAL_TABLET | Freq: Three times a day (TID) | ORAL | 1 refills | Status: AC
Start: 2023-08-30 — End: ?

## 2023-08-30 NOTE — ED Provider Notes (Signed)
[place] EMERGENCY DEPARTMENT AT [facility] Provider Note   CSN: [id number] Arrival date & time: 08/30/23  0201     History  Chief Complaint  Patient presents with   Abdominal Pain    Margorie Keimig is a 52 y.o. female.  Patient with history of hypertension, hyperlipidemia, vitamin B12 deficiency, hepatic steatosis, and H. pylori duodenal ulcers in 2018, followed by gastroenterology --presents to the emergency department for ongoing pain in her abdomen.  Patient has had 2 previous visits this month on 10/14 and 10/16.  Patient had mildly elevated lipase and normal CT scan of the abdomen and pelvis.  She was prescribed Percocet which helped the pain but caused her to become constipated.  Subsequently she was prescribed Colace and MiraLAX.  Pain is epigastric in nature, however she does have pain throughout her abdomen.  It radiates to her to her back.  It seems to be worse at around midnight.  She has had nausea without vomiting.  No chest pains or shortness of breath.  No blood noted in the stool.  No urinary symptoms.  At her previous GI appointment on 08/01/2023 she was instructed to take pantoprazole and famotidine daily instead of just as needed.  History obtained through the patient's daughter who interprets, with permission of the patient.       Home Medications Prior to Admission medications   Medication Sig Start Date End Date Taking? Authorizing Provider  alum & mag hydroxide-simeth (MAALOX PLUS) 400-400-40 MG/5ML suspension Take 15 mLs by mouth every 6 (six) hours as needed for indigestion. 08/28/23   Mesner, Barbara Cower, MD  cetirizine (ZYRTEC) 10 MG tablet TAKE 1 TABLET (10 MG TOTAL) BY MOUTH DAILY. 11/08/21 08/01/23  Claiborne Rigg, NP  docusate sodium (COLACE) 100 MG capsule Take 1 capsule (100 mg total) by mouth every 12 (twelve) hours. 08/28/23   Mesner, Barbara Cower, MD  famotidine (PEPCID) 20 MG tablet Take 1 tablet (20 mg total) by mouth at bedtime. 11/29/22    Arnaldo Natal, NP  gabapentin (NEURONTIN) 300 MG capsule TAKE 1 CAPSULE (300 MG TOTAL) BY MOUTH AT BEDTIME. 09/13/20 08/01/23  Hoy Register, MD  hydrALAZINE (APRESOLINE) 10 MG tablet Take 10 mg by mouth 3 (three) times daily. 05/24/23   [provider]  losartan (COZAAR) 100 MG tablet Take 100 mg by mouth. 05/03/23   [provider]  ondansetron (ZOFRAN) 4 MG tablet Take 1 tablet (4 mg total) by mouth every 6 (six) hours. 08/26/23   Durwin Glaze, MD  pantoprazole (PROTONIX) 40 MG tablet Take 1 tablet (40 mg total) by mouth daily. 08/28/23   Mesner, Barbara Cower, MD  polyethylene glycol (MIRALAX / GLYCOLAX) 17 g packet Take 17 g by mouth daily. 08/28/23   Mesner, Barbara Cower, MD  traZODone (DESYREL) 50 MG tablet Take 1 tablet (50 mg total) by mouth at bedtime as needed for sleep. 09/18/22   Hoy Register, MD      Allergies    Amlodipine    Review of Systems   Review of Systems  Physical Exam Updated Vital Signs BP (!) 182/112 (BP Location: Right Arm)   Pulse 60   Temp 98.3 F (36.8 C)   Resp 18   LMP 11/15/2014   SpO2 99%  Physical Exam Vitals and nursing note reviewed.  Constitutional:      General: She is not in acute distress.    Appearance: She is well-developed.  HENT:     Head: Normocephalic and atraumatic.  Right Ear: External ear normal.     Left Ear: External ear normal.     Nose: Nose normal.  Eyes:     Conjunctiva/sclera: Conjunctivae normal.  Cardiovascular:     Rate and Rhythm: Normal rate and regular rhythm.     Heart sounds: No murmur heard. Pulmonary:     Effort: No respiratory distress.     Breath sounds: No wheezing, rhonchi or rales.  Abdominal:     Palpations: Abdomen is soft.     Tenderness: There is generalized abdominal tenderness and tenderness in the epigastric area. There is no guarding or rebound. Negative signs include Murphy's sign and McBurney's sign.  Musculoskeletal:     Cervical back: Normal range of motion and neck  supple.     Right lower leg: No edema.     Left lower leg: No edema.  Skin:    General: Skin is warm and dry.     Findings: No rash.  Neurological:     General: No focal deficit present.     Mental Status: She is alert. Mental status is at baseline.     Motor: No weakness.  Psychiatric:        Mood and Affect: Mood normal.     ED Results / Procedures / Treatments   Labs (all labs ordered are listed, but only abnormal results are displayed) Labs Reviewed  LIPASE, BLOOD - Abnormal; Notable for the following components:      Result Value   Lipase 55 (*)    All other components within normal limits  COMPREHENSIVE METABOLIC PANEL - Abnormal; Notable for the following components:   Potassium 3.3 (*)    CO2 21 (*)    Glucose, Bld 107 (*)    All other components within normal limits  CBC - Abnormal; Notable for the following components:   WBC 16.3 (*)    All other components within normal limits  URINALYSIS, ROUTINE W REFLEX MICROSCOPIC - Abnormal; Notable for the following components:   Leukocytes,Ua MODERATE (*)    Bacteria, UA RARE (*)    All other components within normal limits    EKG None  Radiology No results found.  Procedures Procedures    Medications Ordered in ED Medications  pantoprazole (PROTONIX) injection 40 mg (40 mg Intravenous Given 08/30/23 0742)  morphine (PF) 4 MG/ML injection 4 mg (4 mg Intravenous Given 08/30/23 0742)  ondansetron (ZOFRAN) injection 4 mg (4 mg Intravenous Given 08/30/23 6962)    ED Course/ Medical Decision Making/ A&P    Patient seen and examined. History obtained directly from patient.  I reviewed most recent ED notes and gastroenterology notes.  Reviewed recent imaging results.  Labs/EKG: Labs personally reviewed and interpreted including CBC with elevated white blood cell count at 16.3, normal hemoglobin; CMP potassium slightly low at 3.3, glucose 107, otherwise unremarkable; lipase slightly elevated at 55; UA white blood  cell count elevated 11-20, clean-catch.  Imaging: None ordered  Medications/Fluids: Ordered: IV Protonix, IV morphine, IV Zofran.   Most recent vital signs reviewed and are as follows: BP (!) 182/112 (BP Location: Right Arm)   Pulse 60   Temp 98.3 F (36.8 C)   Resp 18   LMP 11/15/2014   SpO2 99%   Initial impression: Abdominal pain, acute on chronic, most likely related to patient's history of gastritis/PUD.  9:04 AM Reassessment performed. Patient appears more comfortable now.  States that pain is improved.  Reviewed pertinent lab work and imaging with patient at bedside. Questions  answered.   Most current vital signs reviewed and are as follows: BP (!) 143/93   Pulse 62   Temp 98 F (36.7 C) (Oral)   Resp 20   LMP 11/15/2014   SpO2 100%   Plan: Discharge to home.   Prescriptions written for: Carafate, oxycodone, Zofran  Other home care instructions discussed: Bland diet, slow progression.  Maintain good hydration.  ED return instructions discussed: The patient was urged to return to the Emergency Department immediately with worsening of current symptoms, worsening abdominal pain, persistent vomiting, blood noted in stools, fever, or any other concerns. The patient verbalized understanding.   Follow-up instructions discussed: Patient encouraged to follow-up with their PCP in 3 days.                                 Medical Decision Making Amount and/or Complexity of Data Reviewed Labs: ordered.  Risk Prescription drug management.   For this patient's complaint of abdominal pain, the following conditions were considered on the differential diagnosis: gastritis/PUD, enteritis/duodenitis, appendicitis, cholelithiasis/cholecystitis, cholangitis, pancreatitis, ruptured viscus, colitis, diverticulitis, small/large bowel obstruction, proctitis, cystitis, pyelonephritis, ureteral colic, aortic dissection, aortic aneurysm. In women, ectopic pregnancy, pelvic inflammatory  disease, ovarian cysts, and tubo-ovarian abscess were also considered. Atypical chest etiologies were also considered including ACS, PE, and pneumonia.  Patient has ongoing abdominal pain.  This is her third visit in 4 days for the same.  She has had CT imaging.  Labs look fairly same.  Her lipase was up at first visit, improved at second visit, now mildly elevated.  White blood cell count also elevated, similar to first visit.  Kidney function appears normal.  Denies irritative UTI symptoms.  Suspect that most likely her pain is related to PUD or gastritis given her history.  She would benefit from GI follow-up.  I do not think that repeating CT scan would be of much benefit today.  Will continue symptom control until she can get in with GI.  The patient's vital signs, pertinent lab work and imaging were reviewed and interpreted as discussed in the ED course. Hospitalization was considered for further testing, treatments, or serial exams/observation. However as patient is well-appearing, has a stable exam, and reassuring studies today, I do not feel that they warrant admission at this time. This plan was discussed with the patient who verbalizes agreement and comfort with this plan and seems reliable and able to return to the Emergency Department with worsening or changing symptoms.          Final Clinical Impression(s) / ED Diagnoses Final diagnoses:  Epigastric pain    Rx / DC Orders ED Discharge Orders          Ordered    oxyCODONE-acetaminophen (PERCOCET/ROXICET) 5-325 MG tablet  Every 6 hours PRN        08/30/23 0901    sucralfate (CARAFATE) 1 g tablet  3 times daily with meals & bedtime        08/30/23 0901    ondansetron (ZOFRAN-ODT) 4 MG disintegrating tablet  Every 8 hours PRN        08/30/23 0904              Renne Crigler, PA-C 08/30/23 0907    Sloan Leiter, DO 09/01/23 0720

## 2023-08-30 NOTE — Discharge Instructions (Addendum)
Your lab work tonight looks stable.  Your white cell count is a little bit elevated and your pancreas test is slightly high, but not as high as it was when you were seen on the 14th.  I recommend the following:   For your stomach, continue taking pantoprazole and famotidine every day.  I will add Carafate on before meals and at bedtime to see if this helps protect the stomach and decrease pain.  I will give another course of pain medication.  Please use this at times when your pain becomes severe.  Please continue the stool softening medications to prevent constipation from the stronger pain medicine.  It will be very important for you to follow-up with your gastroenterologist as planned.  Return to the emergency department if you have worsening uncontrolled pain, persistent vomiting, blood noted in the stool, fever, new symptoms or other concerns.

## 2023-08-30 NOTE — ED Triage Notes (Signed)
Pt reports epigastric pain. Rn asked if she was still taking oxy px for previous pancreatitis dx. She states she was told to stop it due to constipation issues. Pt endorses nausea and "weakness".

## 2023-08-31 DIAGNOSIS — R4181 Age-related cognitive decline: Secondary | ICD-10-CM | POA: Diagnosis not present

## 2023-09-01 DIAGNOSIS — R4181 Age-related cognitive decline: Secondary | ICD-10-CM | POA: Diagnosis not present

## 2023-09-02 DIAGNOSIS — R4181 Age-related cognitive decline: Secondary | ICD-10-CM | POA: Diagnosis not present

## 2023-09-02 NOTE — Telephone Encounter (Signed)
Called and spoke to patient's daughter in reference to complaints of abdominal pain, daughter states her mother had gone to the hospital last week. No 7 day hold noted at this time, while noting in scheduling last week, unable to place an appt for the 28th. Did note a cancelled appt for 10:00 this am. Called the daughter to see if it were possible for her to make the appt. Daughter states she Is unable to make the 10:00 appt for today. Daughter did state the patient is not having any abdominal pain at this time and seems to be doing fine. Patient has appt for previsit on 09/17/23 and an appt scheduled for 09/24/23 for EGD/Colon. Unable to schedule a 2 week f/u appt at this time. Daughter was informed to take the patient to Urgent care, ED, or her PCP if she has any issues prior to an f/u appt.

## 2023-09-02 NOTE — Plan of Care (Signed)
[facility] Tonsillectomy/Adenoidectomy, Postoperative PEDS care plan entered in error.

## 2023-09-03 DIAGNOSIS — I10 Essential (primary) hypertension: Secondary | ICD-10-CM | POA: Diagnosis not present

## 2023-09-03 DIAGNOSIS — K259 Gastric ulcer, unspecified as acute or chronic, without hemorrhage or perforation: Secondary | ICD-10-CM | POA: Diagnosis not present

## 2023-09-03 DIAGNOSIS — D72829 Elevated white blood cell count, unspecified: Secondary | ICD-10-CM | POA: Diagnosis not present

## 2023-09-03 DIAGNOSIS — R4181 Age-related cognitive decline: Secondary | ICD-10-CM | POA: Diagnosis not present

## 2023-09-04 DIAGNOSIS — R4181 Age-related cognitive decline: Secondary | ICD-10-CM | POA: Diagnosis not present

## 2023-09-05 ENCOUNTER — Other Ambulatory Visit: Payer: Self-pay

## 2023-09-05 DIAGNOSIS — R4181 Age-related cognitive decline: Secondary | ICD-10-CM | POA: Diagnosis not present

## 2023-09-06 DIAGNOSIS — R4181 Age-related cognitive decline: Secondary | ICD-10-CM | POA: Diagnosis not present

## 2023-09-07 DIAGNOSIS — R4181 Age-related cognitive decline: Secondary | ICD-10-CM | POA: Diagnosis not present

## 2023-09-08 DIAGNOSIS — R4181 Age-related cognitive decline: Secondary | ICD-10-CM | POA: Diagnosis not present

## 2023-09-09 DIAGNOSIS — R4181 Age-related cognitive decline: Secondary | ICD-10-CM | POA: Diagnosis not present

## 2023-09-10 DIAGNOSIS — R4181 Age-related cognitive decline: Secondary | ICD-10-CM | POA: Diagnosis not present

## 2023-09-11 DIAGNOSIS — R4181 Age-related cognitive decline: Secondary | ICD-10-CM | POA: Diagnosis not present

## 2023-09-12 DIAGNOSIS — R4181 Age-related cognitive decline: Secondary | ICD-10-CM | POA: Diagnosis not present

## 2023-09-13 DIAGNOSIS — Z419 Encounter for procedure for purposes other than remedying health state, unspecified: Secondary | ICD-10-CM | POA: Diagnosis not present

## 2023-09-13 DIAGNOSIS — R4181 Age-related cognitive decline: Secondary | ICD-10-CM | POA: Diagnosis not present

## 2023-09-14 DIAGNOSIS — R4181 Age-related cognitive decline: Secondary | ICD-10-CM | POA: Diagnosis not present

## 2023-09-15 DIAGNOSIS — R4181 Age-related cognitive decline: Secondary | ICD-10-CM | POA: Diagnosis not present

## 2023-09-16 DIAGNOSIS — R4181 Age-related cognitive decline: Secondary | ICD-10-CM | POA: Diagnosis not present

## 2023-09-17 ENCOUNTER — Ambulatory Visit (AMBULATORY_SURGERY_CENTER): Payer: Medicaid Other

## 2023-09-17 ENCOUNTER — Other Ambulatory Visit: Payer: Self-pay

## 2023-09-17 VITALS — Ht 60.0 in | Wt 112.8 lb

## 2023-09-17 DIAGNOSIS — R1084 Generalized abdominal pain: Secondary | ICD-10-CM

## 2023-09-17 DIAGNOSIS — R4181 Age-related cognitive decline: Secondary | ICD-10-CM | POA: Diagnosis not present

## 2023-09-17 DIAGNOSIS — Z8711 Personal history of peptic ulcer disease: Secondary | ICD-10-CM

## 2023-09-17 MED ORDER — PANTOPRAZOLE SODIUM 40 MG PO TBEC: 40.0000 mg | DELAYED_RELEASE_TABLET | Freq: Every day | ORAL | 0 refills | Status: DC

## 2023-09-17 NOTE — Progress Notes (Signed)
No egg or soy allergy known to patient  No issues known to pt with past sedation with any surgeries or procedures Patient denies ever being told they had issues or difficulty with intubation  No FH of Malignant Hyperthermia Pt is not on diet pills Pt is not on  home 02  Pt is not on blood thinners  Pt denies issues with constipation  No A fib or A flutter Have any cardiac testing pending--no Pt instructed to use [employer] or GoodRx for a price reduction on prep   In person with interpreter and pt daugher   Patient's chart reviewed by Cathlyn Parsons CRNA prior to previsit and patient appropriate for the [facility].  Previsit completed and red dot placed by patient's name on their procedure day (on provider's schedule).

## 2023-09-18 DIAGNOSIS — R4181 Age-related cognitive decline: Secondary | ICD-10-CM | POA: Diagnosis not present

## 2023-09-19 DIAGNOSIS — R4181 Age-related cognitive decline: Secondary | ICD-10-CM | POA: Diagnosis not present

## 2023-09-20 DIAGNOSIS — R4181 Age-related cognitive decline: Secondary | ICD-10-CM | POA: Diagnosis not present

## 2023-09-21 ENCOUNTER — Encounter: Payer: Self-pay | Admitting: Certified Registered Nurse Anesthetist

## 2023-09-21 DIAGNOSIS — R4181 Age-related cognitive decline: Secondary | ICD-10-CM | POA: Diagnosis not present

## 2023-09-23 DIAGNOSIS — I10 Essential (primary) hypertension: Secondary | ICD-10-CM | POA: Diagnosis not present

## 2023-09-23 DIAGNOSIS — R3981 Functional urinary incontinence: Secondary | ICD-10-CM | POA: Diagnosis not present

## 2023-09-23 DIAGNOSIS — R4181 Age-related cognitive decline: Secondary | ICD-10-CM | POA: Diagnosis not present

## 2023-09-24 ENCOUNTER — Ambulatory Visit: Payer: Medicaid Other | Admitting: Gastroenterology

## 2023-09-24 ENCOUNTER — Encounter: Payer: Self-pay | Admitting: Gastroenterology

## 2023-09-24 VITALS — BP 167/89 | HR 52 | Temp 98.0°F | Resp 18 | Ht 60.0 in | Wt 112.8 lb

## 2023-09-24 DIAGNOSIS — Z8711 Personal history of peptic ulcer disease: Secondary | ICD-10-CM | POA: Diagnosis not present

## 2023-09-24 DIAGNOSIS — R1084 Generalized abdominal pain: Secondary | ICD-10-CM

## 2023-09-24 DIAGNOSIS — E785 Hyperlipidemia, unspecified: Secondary | ICD-10-CM | POA: Diagnosis not present

## 2023-09-24 DIAGNOSIS — K2970 Gastritis, unspecified, without bleeding: Secondary | ICD-10-CM | POA: Diagnosis not present

## 2023-09-24 DIAGNOSIS — I10 Essential (primary) hypertension: Secondary | ICD-10-CM | POA: Diagnosis not present

## 2023-09-24 DIAGNOSIS — R4181 Age-related cognitive decline: Secondary | ICD-10-CM | POA: Diagnosis not present

## 2023-09-24 MED ORDER — SODIUM CHLORIDE 0.9 % IV SOLN: 500.0000 mL | INTRAVENOUS | Status: DC

## 2023-09-24 MED ORDER — PANTOPRAZOLE SODIUM 40 MG PO TBEC
40.0000 mg | DELAYED_RELEASE_TABLET | Freq: Every day | ORAL | 3 refills | Status: AC
Start: 2023-09-24 — End: ?

## 2023-09-24 MED ORDER — DICYCLOMINE HCL 10 MG PO CAPS: ORAL_CAPSULE | ORAL | 3 refills | Status: AC

## 2023-09-24 NOTE — Progress Notes (Unsigned)
VS by DT  Pt's states no medical or surgical changes since previsit or office visit.   Interpreter used today at the [facility] for this pt.  Interpreter's name is- Veronia Beets

## 2023-09-24 NOTE — Progress Notes (Signed)
Called to room to assist during endoscopic procedure.  Patient ID and intended procedure confirmed with present staff. Received instructions for my participation in the procedure from the performing physician.  

## 2023-09-24 NOTE — Progress Notes (Signed)
[facility] History and Physical   Primary Care Physician:  [facility]   Reason for Procedure:  abdominal pain  Plan:    EGD  with possible interventions as needed     HPI: Alice Woodard is a very pleasant 52 y.o. female here for EGD for evaluation of abdominal pain . H/o gastro duodenal ulcer and H.pylori   The risks and benefits as well as alternatives of endoscopic procedure(s) have been discussed and reviewed. All questions answered. The patient agrees to proceed.    Past Medical History:  Diagnosis Date   Allergy    outdoor, not sure what is allergy   Anemia    History of blood transfusion 04/17/2017   "anemic" from the bleeding ulcer   Hyperlipidemia    Hypertension     Past Surgical History:  Procedure Laterality Date   APPENDECTOMY     CHOLECYSTECTOMY OPEN     ESOPHAGOGASTRODUODENOSCOPY N/A 04/18/2017   Procedure: ESOPHAGOGASTRODUODENOSCOPY (EGD);  Surgeon: Napoleon Form, MD;  Location: [facility] ENDOSCOPY;  Service: Endoscopy;  Laterality: N/A;   UPPER GASTROINTESTINAL ENDOSCOPY  04/2017    Prior to Admission medications   Medication Sig Start Date End Date Taking? Authorizing Provider  docusate sodium (COLACE) 100 MG capsule Take 1 capsule (100 mg total) by mouth every 12 (twelve) hours. 08/28/23  Yes Mesner, Barbara Cower, MD  famotidine (PEPCID) 20 MG tablet Take 1 tablet (20 mg total) by mouth at bedtime. 11/29/22  Yes Arnaldo Natal, NP  gabapentin (NEURONTIN) 300 MG capsule TAKE 1 CAPSULE (300 MG TOTAL) BY MOUTH AT BEDTIME. 09/13/20 09/24/23 Yes Newlin, Enobong, MD  hydrALAZINE (APRESOLINE) 10 MG tablet Take 10 mg by mouth 3 (three) times daily. 05/24/23  Yes [provider]  losartan (COZAAR) 100 MG tablet Take 100 mg by mouth. 05/03/23  Yes [provider]  pantoprazole (PROTONIX) 40 MG tablet Take 1 tablet (40 mg total) by mouth daily before breakfast. 09/17/23  Yes Christabell Loseke V, MD  polyethylene glycol  (MIRALAX / GLYCOLAX) 17 g packet Take 17 g by mouth daily. 08/28/23  Yes Mesner, Barbara Cower, MD  traZODone (DESYREL) 50 MG tablet Take 1 tablet (50 mg total) by mouth at bedtime as needed for sleep. 09/18/22  Yes Hoy Register, MD  alum & mag hydroxide-simeth (MAALOX PLUS) 400-400-40 MG/5ML suspension Take 15 mLs by mouth every 6 (six) hours as needed for indigestion. 08/28/23   Mesner, Barbara Cower, MD  Blood Glucose Monitoring Suppl (ACCU-CHEK GUIDE) w/Device KIT USE DEVICE TO CHECK YOUR BLOOD PRESSURE THREE TO FOUR TIMES A WEEK Patient not taking: Reported on 09/17/2023 09/03/23   [provider]  Blood Pressure KIT by Does not apply route. Patient not taking: Reported on 09/17/2023 09/03/23   [provider]  cetirizine (ZYRTEC) 10 MG tablet TAKE 1 TABLET (10 MG TOTAL) BY MOUTH DAILY. 11/08/21 08/01/23  Claiborne Rigg, NP  ondansetron (ZOFRAN) 4 MG tablet Take 1 tablet (4 mg total) by mouth every 6 (six) hours. Patient not taking: Reported on 09/17/2023 08/26/23   Durwin Glaze, MD  ondansetron (ZOFRAN-ODT) 4 MG disintegrating tablet Take 1 tablet (4 mg total) by mouth every 8 (eight) hours as needed for nausea or vomiting. Patient not taking: Reported on 09/17/2023 08/30/23   Renne Crigler, PA-C  oxyCODONE-acetaminophen (PERCOCET/ROXICET) 5-325 MG tablet Take 1 tablet by mouth every 6 (six) hours as needed for severe pain (pain score 7-10). 08/30/23   Renne Crigler, PA-C  sucralfate (CARAFATE) 1 g tablet Take 1  tablet (1 g total) by mouth 4 (four) times daily -  with meals and at bedtime. Patient not taking: Reported on 09/17/2023 08/30/23   Renne Crigler, PA-C    Current Outpatient Medications  Medication Sig Dispense Refill   docusate sodium (COLACE) 100 MG capsule Take 1 capsule (100 mg total) by mouth every 12 (twelve) hours. 60 capsule 0   famotidine (PEPCID) 20 MG tablet Take 1 tablet (20 mg total) by mouth at bedtime. 30 tablet 1   gabapentin (NEURONTIN) 300 MG capsule TAKE 1  CAPSULE (300 MG TOTAL) BY MOUTH AT BEDTIME. 60 capsule 6   hydrALAZINE (APRESOLINE) 10 MG tablet Take 10 mg by mouth 3 (three) times daily.     losartan (COZAAR) 100 MG tablet Take 100 mg by mouth.     pantoprazole (PROTONIX) 40 MG tablet Take 1 tablet (40 mg total) by mouth daily before breakfast. 30 tablet 0   polyethylene glycol (MIRALAX / GLYCOLAX) 17 g packet Take 17 g by mouth daily. 14 each 0   traZODone (DESYREL) 50 MG tablet Take 1 tablet (50 mg total) by mouth at bedtime as needed for sleep. 30 tablet 3   alum & mag hydroxide-simeth (MAALOX PLUS) 400-400-40 MG/5ML suspension Take 15 mLs by mouth every 6 (six) hours as needed for indigestion. 355 mL 0   Blood Glucose Monitoring Suppl (ACCU-CHEK GUIDE) w/Device KIT USE DEVICE TO CHECK YOUR BLOOD PRESSURE THREE TO FOUR TIMES A WEEK (Patient not taking: Reported on 09/17/2023)     Blood Pressure KIT by Does not apply route. (Patient not taking: Reported on 09/17/2023)     cetirizine (ZYRTEC) 10 MG tablet TAKE 1 TABLET (10 MG TOTAL) BY MOUTH DAILY. 90 tablet 1   ondansetron (ZOFRAN) 4 MG tablet Take 1 tablet (4 mg total) by mouth every 6 (six) hours. (Patient not taking: Reported on 09/17/2023) 12 tablet 0   ondansetron (ZOFRAN-ODT) 4 MG disintegrating tablet Take 1 tablet (4 mg total) by mouth every 8 (eight) hours as needed for nausea or vomiting. (Patient not taking: Reported on 09/17/2023) 10 tablet 0   oxyCODONE-acetaminophen (PERCOCET/ROXICET) 5-325 MG tablet Take 1 tablet by mouth every 6 (six) hours as needed for severe pain (pain score 7-10). 10 tablet 0   sucralfate (CARAFATE) 1 g tablet Take 1 tablet (1 g total) by mouth 4 (four) times daily -  with meals and at bedtime. (Patient not taking: Reported on 09/17/2023) 60 tablet 1   Current Facility-Administered Medications  Medication Dose Route Frequency Provider Last Rate Last Admin   0.9 %  sodium chloride infusion  500 mL Intravenous Continuous Harika Laidlaw, Eleonore Chiquito, MD        Allergies  as of 09/24/2023 - Review Complete 09/24/2023  Allergen Reaction Noted   Amlodipine Rash 04/17/2017    Family History  Problem Relation Age of Onset   Cancer - Other Mother        blood   Colon cancer Neg Hx    Esophageal cancer Neg Hx    Rectal cancer Neg Hx    Stomach cancer Neg Hx     Social History   Socioeconomic History   Marital status: Single    Spouse name: Not on file   Number of children: 2   Years of education: Not on file   Highest education level: Not on file  Occupational History   Not on file  Tobacco Use   Smoking status: Never   Smokeless tobacco: Never  Vaping Use  Vaping status: Never Used  Substance and Sexual Activity   Alcohol use: Never   Drug use: Never   Sexual activity: Not Currently    Birth control/protection: Post-menopausal  Other Topics Concern   Not on file  Social History Narrative   Not on file   Social Determinants of Health   Financial Resource Strain: Low Risk  (01/24/2023)   Received from [facility], [facility]   Overall Financial Resource Strain (CARDIA)    Difficulty of Paying Living Expenses: Not hard at all  Food Insecurity: No Food Insecurity (01/24/2023)   Received from [facility], [facility]   Hunger Vital Sign    Worried About Running Out of Food in the Last Year: Never true    Ran Out of Food in the Last Year: Never true  Transportation Needs: No Transportation Needs (01/24/2023)   Received from [facility], [facility]   PRAPARE - Transportation    Lack of Transportation (Medical): No    Lack of Transportation (Non-Medical): No  Physical Activity: Not on file  Stress: Not on file  Social Connections: Unknown (10/16/2022)   Received from [facility], [facility]   Social Network    Social Network: Not on file  Intimate Partner Violence: Unknown (10/16/2022)   Received from [facility], [facility]   HITS    Physically Hurt: Not on file    Insult or Talk Down To: Not on file     Threaten Physical Harm: Not on file    Scream or Curse: Not on file    Review of Systems:  All other review of systems negative except as mentioned in the HPI.  Physical Exam: Vital signs in last 24 hours: BP (!) 151/84   Pulse (!) 55   Temp 98 F (36.7 C)   Ht 5' (1.524 m)   Wt 112 lb 12.8 oz (51.2 kg)   LMP 11/15/2014   SpO2 98%   BMI 22.03 kg/m  General:   Alert, NAD Lungs:  Clear .   Heart:  Regular rate and rhythm Abdomen:  Soft, nontender and nondistended. Neuro/Psych:  Alert and cooperative. Normal mood and affect. A and O x 3  Reviewed labs, radiology imaging, old records and pertinent past GI work up  Patient is appropriate for planned procedure(s) and anesthesia in an ambulatory setting   K. Scherry Ran , MD [phone]

## 2023-09-24 NOTE — Patient Instructions (Signed)
Follow an anti-reflux regimen. Continue present medications. Await pathology results. Pantoprazole 40 mg daily. Miralax 1 capful to prevent constipation. Dicyclomine 10 mg every 8 hours as needed.   YOU HAD AN ENDOSCOPIC PROCEDURE TODAY AT THE [facility]:   Refer to the procedure report that was given to you for any specific questions about what was found during the examination.  If the procedure report does not answer your questions, please call your gastroenterologist to clarify.  If you requested that your care partner not be given the details of your procedure findings, then the procedure report has been included in a sealed envelope for you to review at your convenience later.  YOU SHOULD EXPECT: Some feelings of bloating in the abdomen. Passage of more gas than usual.  Walking can help get rid of the air that was put into your GI tract during the procedure and reduce the bloating. If you had a lower endoscopy (such as a colonoscopy or flexible sigmoidoscopy) you may notice spotting of blood in your stool or on the toilet paper. If you underwent a bowel prep for your procedure, you may not have a normal bowel movement for a few days.  Please Note:  You might notice some irritation and congestion in your nose or some drainage.  This is from the oxygen used during your procedure.  There is no need for concern and it should clear up in a day or so.  SYMPTOMS TO REPORT IMMEDIATELY:   Following upper endoscopy (EGD)  Vomiting of blood or coffee ground material  New chest pain or pain under the shoulder blades  Painful or persistently difficult swallowing  New shortness of breath  Fever of 100F or higher  Black, tarry-looking stools  For urgent or emergent issues, a gastroenterologist can be reached at any hour by calling [phone]. Do not use MyChart messaging for urgent concerns.    DIET:  We do recommend a small meal at first, but then you may proceed to your  regular diet.  Drink plenty of fluids but you should avoid alcoholic beverages for 24 hours.  ACTIVITY:  You should plan to take it easy for the rest of today and you should NOT DRIVE or use heavy machinery until tomorrow (because of the sedation medicines used during the test).    FOLLOW UP: Our staff will call the number listed on your records the next business day following your procedure.  We will call around 7:15- 8:00 am to check on you and address any questions or concerns that you may have regarding the information given to you following your procedure. If we do not reach you, we will leave a message.     If any biopsies were taken you will be contacted by phone or by letter within the next 1-3 weeks.  Please call us at [phone] if you have not heard about the biopsies in 3 weeks.    SIGNATURES/CONFIDENTIALITY: You and/or your care partner have signed paperwork which will be entered into your electronic medical record.  These signatures attest to the fact that that the information above on your After Visit Summary has been reviewed and is understood.  Full responsibility of the confidentiality of this discharge information lies with you and/or your care-partner.

## 2023-09-24 NOTE — Op Note (Signed)
[facility] Patient Name: Alice Woodard Procedure Date: 09/24/2023 8:09 AM MRN: [id number] Endoscopist: Napoleon Form , MD, [id number] Age: 52 Referring MD:  Date of Birth: [birth year] Gender: Female Account #: [id number] Procedure:                Upper GI endoscopy Indications:              Epigastric abdominal pain Medicines:                Monitored Anesthesia Care Procedure:                Pre-Anesthesia Assessment:                           - Prior to the procedure, a History and Physical                            was performed, and patient medications and                            allergies were reviewed. The patient's tolerance of                            previous anesthesia was also reviewed. The risks                            and benefits of the procedure and the sedation                            options and risks were discussed with the patient.                            All questions were answered, and informed consent                            was obtained. Prior Anticoagulants: The patient has                            taken no anticoagulant or antiplatelet agents. ASA                            Grade Assessment: II - A patient with mild systemic                            disease. After reviewing the risks and benefits,                            the patient was deemed in satisfactory condition to                            undergo the procedure.                           After obtaining informed consent, the endoscope was  passed under direct vision. Throughout the                            procedure, the patient's blood pressure, pulse, and                            oxygen saturations were monitored continuously. The                            Olympus Scope [id number] was introduced through the                            mouth, and advanced to the second part of duodenum.                            The upper GI  endoscopy was accomplished without                            difficulty. The patient tolerated the procedure                            well. Scope In: Scope Out: Findings:                 The Z-line was regular and was found 38 cm from the                            incisors.                           No gross lesions were noted in the entire esophagus.                           Patchy mild inflammation characterized by                            congestion (edema) and erythema was found in the                            entire examined stomach. Biopsies were taken with a                            cold forceps for Helicobacter pylori testing.                           The cardia and gastric fundus were normal on                            retroflexion.                           The examined duodenum was normal. Complications:            No immediate complications. Estimated Blood Loss:     Estimated blood loss: none. Impression:               -  Z-line regular, 38 cm from the incisors.                           - No gross lesions in the entire esophagus.                           - Gastritis. Biopsied.                           - Normal examined duodenum. Recommendation:           - Patient has a contact number available for                            emergencies. The signs and symptoms of potential                            delayed complications were discussed with the                            patient. Return to normal activities tomorrow.                            Written discharge instructions were provided to the                            patient.                           - Resume previous diet.                           - Continue present medications.                           - Await pathology results.                           - Follow an antireflux regimen.                           - Pantoprazole 40mg  daily X 90 days with 3 refills                           - Miralax  1 capful to prevent constipation                           -Dicyclomine 10mg  q8h prn X 30 tabs with 3 refills Napoleon Form, MD 09/24/2023 8:30:10 AM This report has been signed electronically.

## 2023-09-24 NOTE — Progress Notes (Unsigned)
Report to PACU, RN, vss, BBS= Clear.  

## 2023-09-25 ENCOUNTER — Telehealth: Payer: Self-pay

## 2023-09-25 DIAGNOSIS — R4181 Age-related cognitive decline: Secondary | ICD-10-CM | POA: Diagnosis not present

## 2023-09-25 NOTE — Telephone Encounter (Signed)
  Follow up Call-     09/24/2023    7:22 AM 07/27/2021    2:20 PM  Call back number  Post procedure Call Back phone  # [phone]- daughter [phone] daughter Drue Dun  Permission to leave phone message Yes Yes     Patient questions:  Do you have a fever, pain , or abdominal swelling? No. Pain Score  0 *  Have you tolerated food without any problems? Yes.    Have you been able to return to your normal activities? Yes.    Do you have any questions about your discharge instructions: Diet   No. Medications  No. Follow up visit  No.  Do you have questions or concerns about your Care? No.  Actions: * If pain score is 4 or above: No action needed, pain <4.

## 2023-09-26 DIAGNOSIS — R4181 Age-related cognitive decline: Secondary | ICD-10-CM | POA: Diagnosis not present

## 2023-09-26 LAB — SURGICAL PATHOLOGY

## 2023-09-27 DIAGNOSIS — R4181 Age-related cognitive decline: Secondary | ICD-10-CM | POA: Diagnosis not present

## 2023-09-28 DIAGNOSIS — R4181 Age-related cognitive decline: Secondary | ICD-10-CM | POA: Diagnosis not present

## 2023-09-29 DIAGNOSIS — R4181 Age-related cognitive decline: Secondary | ICD-10-CM | POA: Diagnosis not present

## 2023-09-30 DIAGNOSIS — R4181 Age-related cognitive decline: Secondary | ICD-10-CM | POA: Diagnosis not present

## 2023-10-01 DIAGNOSIS — R4181 Age-related cognitive decline: Secondary | ICD-10-CM | POA: Diagnosis not present

## 2023-10-02 DIAGNOSIS — R4181 Age-related cognitive decline: Secondary | ICD-10-CM | POA: Diagnosis not present

## 2023-10-03 DIAGNOSIS — R4181 Age-related cognitive decline: Secondary | ICD-10-CM | POA: Diagnosis not present

## 2023-10-04 DIAGNOSIS — R4181 Age-related cognitive decline: Secondary | ICD-10-CM | POA: Diagnosis not present

## 2023-10-05 DIAGNOSIS — R4181 Age-related cognitive decline: Secondary | ICD-10-CM | POA: Diagnosis not present

## 2023-10-07 DIAGNOSIS — R4181 Age-related cognitive decline: Secondary | ICD-10-CM | POA: Diagnosis not present

## 2023-10-08 DIAGNOSIS — R4181 Age-related cognitive decline: Secondary | ICD-10-CM | POA: Diagnosis not present

## 2023-10-09 DIAGNOSIS — R4181 Age-related cognitive decline: Secondary | ICD-10-CM | POA: Diagnosis not present

## 2023-10-10 DIAGNOSIS — R4181 Age-related cognitive decline: Secondary | ICD-10-CM | POA: Diagnosis not present

## 2023-10-11 DIAGNOSIS — R4181 Age-related cognitive decline: Secondary | ICD-10-CM | POA: Diagnosis not present

## 2023-10-12 DIAGNOSIS — R4181 Age-related cognitive decline: Secondary | ICD-10-CM | POA: Diagnosis not present

## 2023-10-13 DIAGNOSIS — Z419 Encounter for procedure for purposes other than remedying health state, unspecified: Secondary | ICD-10-CM | POA: Diagnosis not present

## 2023-10-14 DIAGNOSIS — R4181 Age-related cognitive decline: Secondary | ICD-10-CM | POA: Diagnosis not present

## 2023-10-15 DIAGNOSIS — R4181 Age-related cognitive decline: Secondary | ICD-10-CM | POA: Diagnosis not present

## 2023-10-16 DIAGNOSIS — R4181 Age-related cognitive decline: Secondary | ICD-10-CM | POA: Diagnosis not present

## 2023-10-17 DIAGNOSIS — R4181 Age-related cognitive decline: Secondary | ICD-10-CM | POA: Diagnosis not present

## 2023-10-18 ENCOUNTER — Encounter: Payer: Self-pay | Admitting: Gastroenterology

## 2023-10-18 DIAGNOSIS — R4181 Age-related cognitive decline: Secondary | ICD-10-CM | POA: Diagnosis not present

## 2023-10-19 DIAGNOSIS — R4181 Age-related cognitive decline: Secondary | ICD-10-CM | POA: Diagnosis not present

## 2023-10-20 DIAGNOSIS — R4181 Age-related cognitive decline: Secondary | ICD-10-CM | POA: Diagnosis not present

## 2023-10-21 DIAGNOSIS — R4181 Age-related cognitive decline: Secondary | ICD-10-CM | POA: Diagnosis not present

## 2023-10-22 DIAGNOSIS — R4181 Age-related cognitive decline: Secondary | ICD-10-CM | POA: Diagnosis not present

## 2023-10-23 DIAGNOSIS — R4181 Age-related cognitive decline: Secondary | ICD-10-CM | POA: Diagnosis not present

## 2023-10-24 DIAGNOSIS — R4181 Age-related cognitive decline: Secondary | ICD-10-CM | POA: Diagnosis not present

## 2023-10-25 DIAGNOSIS — R4181 Age-related cognitive decline: Secondary | ICD-10-CM | POA: Diagnosis not present

## 2023-10-26 DIAGNOSIS — R4181 Age-related cognitive decline: Secondary | ICD-10-CM | POA: Diagnosis not present

## 2023-10-27 DIAGNOSIS — R4181 Age-related cognitive decline: Secondary | ICD-10-CM | POA: Diagnosis not present

## 2023-10-28 DIAGNOSIS — R4181 Age-related cognitive decline: Secondary | ICD-10-CM | POA: Diagnosis not present

## 2023-10-29 DIAGNOSIS — R4181 Age-related cognitive decline: Secondary | ICD-10-CM | POA: Diagnosis not present

## 2023-10-30 DIAGNOSIS — R4181 Age-related cognitive decline: Secondary | ICD-10-CM | POA: Diagnosis not present

## 2023-10-31 DIAGNOSIS — R4181 Age-related cognitive decline: Secondary | ICD-10-CM | POA: Diagnosis not present

## 2023-11-01 DIAGNOSIS — R4181 Age-related cognitive decline: Secondary | ICD-10-CM | POA: Diagnosis not present

## 2023-11-02 DIAGNOSIS — R4181 Age-related cognitive decline: Secondary | ICD-10-CM | POA: Diagnosis not present

## 2023-11-03 DIAGNOSIS — R4181 Age-related cognitive decline: Secondary | ICD-10-CM | POA: Diagnosis not present

## 2023-11-04 DIAGNOSIS — R4181 Age-related cognitive decline: Secondary | ICD-10-CM | POA: Diagnosis not present

## 2023-11-06 DIAGNOSIS — R4181 Age-related cognitive decline: Secondary | ICD-10-CM | POA: Diagnosis not present

## 2023-11-07 DIAGNOSIS — R4181 Age-related cognitive decline: Secondary | ICD-10-CM | POA: Diagnosis not present

## 2023-11-08 DIAGNOSIS — R4181 Age-related cognitive decline: Secondary | ICD-10-CM | POA: Diagnosis not present

## 2023-11-09 DIAGNOSIS — R4181 Age-related cognitive decline: Secondary | ICD-10-CM | POA: Diagnosis not present

## 2023-11-10 DIAGNOSIS — R4181 Age-related cognitive decline: Secondary | ICD-10-CM | POA: Diagnosis not present

## 2023-11-11 DIAGNOSIS — R4181 Age-related cognitive decline: Secondary | ICD-10-CM | POA: Diagnosis not present

## 2023-11-12 DIAGNOSIS — R4181 Age-related cognitive decline: Secondary | ICD-10-CM | POA: Diagnosis not present

## 2023-11-13 DIAGNOSIS — R4181 Age-related cognitive decline: Secondary | ICD-10-CM | POA: Diagnosis not present

## 2023-11-13 DIAGNOSIS — Z419 Encounter for procedure for purposes other than remedying health state, unspecified: Secondary | ICD-10-CM | POA: Diagnosis not present

## 2023-11-14 DIAGNOSIS — R4181 Age-related cognitive decline: Secondary | ICD-10-CM | POA: Diagnosis not present

## 2023-11-15 DIAGNOSIS — R4181 Age-related cognitive decline: Secondary | ICD-10-CM | POA: Diagnosis not present

## 2023-11-16 DIAGNOSIS — R4181 Age-related cognitive decline: Secondary | ICD-10-CM | POA: Diagnosis not present

## 2023-11-17 DIAGNOSIS — R4181 Age-related cognitive decline: Secondary | ICD-10-CM | POA: Diagnosis not present

## 2023-11-18 DIAGNOSIS — R4181 Age-related cognitive decline: Secondary | ICD-10-CM | POA: Diagnosis not present

## 2023-11-19 DIAGNOSIS — R4181 Age-related cognitive decline: Secondary | ICD-10-CM | POA: Diagnosis not present

## 2023-11-20 DIAGNOSIS — R4181 Age-related cognitive decline: Secondary | ICD-10-CM | POA: Diagnosis not present

## 2023-11-21 DIAGNOSIS — R4181 Age-related cognitive decline: Secondary | ICD-10-CM | POA: Diagnosis not present

## 2023-11-22 DIAGNOSIS — R4181 Age-related cognitive decline: Secondary | ICD-10-CM | POA: Diagnosis not present

## 2023-11-23 DIAGNOSIS — R4181 Age-related cognitive decline: Secondary | ICD-10-CM | POA: Diagnosis not present

## 2023-11-24 DIAGNOSIS — R4181 Age-related cognitive decline: Secondary | ICD-10-CM | POA: Diagnosis not present

## 2023-11-25 DIAGNOSIS — R4181 Age-related cognitive decline: Secondary | ICD-10-CM | POA: Diagnosis not present

## 2023-11-26 DIAGNOSIS — R4181 Age-related cognitive decline: Secondary | ICD-10-CM | POA: Diagnosis not present

## 2023-11-27 DIAGNOSIS — R4181 Age-related cognitive decline: Secondary | ICD-10-CM | POA: Diagnosis not present

## 2023-11-28 DIAGNOSIS — R4181 Age-related cognitive decline: Secondary | ICD-10-CM | POA: Diagnosis not present

## 2023-11-29 DIAGNOSIS — R4181 Age-related cognitive decline: Secondary | ICD-10-CM | POA: Diagnosis not present

## 2023-11-30 DIAGNOSIS — R4181 Age-related cognitive decline: Secondary | ICD-10-CM | POA: Diagnosis not present

## 2023-12-01 DIAGNOSIS — R4181 Age-related cognitive decline: Secondary | ICD-10-CM | POA: Diagnosis not present

## 2023-12-02 DIAGNOSIS — R4181 Age-related cognitive decline: Secondary | ICD-10-CM | POA: Diagnosis not present

## 2023-12-03 DIAGNOSIS — R4181 Age-related cognitive decline: Secondary | ICD-10-CM | POA: Diagnosis not present

## 2023-12-04 DIAGNOSIS — R4181 Age-related cognitive decline: Secondary | ICD-10-CM | POA: Diagnosis not present

## 2023-12-05 DIAGNOSIS — R4181 Age-related cognitive decline: Secondary | ICD-10-CM | POA: Diagnosis not present

## 2023-12-06 DIAGNOSIS — I10 Essential (primary) hypertension: Secondary | ICD-10-CM | POA: Diagnosis not present

## 2023-12-06 DIAGNOSIS — K2950 Unspecified chronic gastritis without bleeding: Secondary | ICD-10-CM | POA: Diagnosis not present

## 2023-12-06 DIAGNOSIS — R4181 Age-related cognitive decline: Secondary | ICD-10-CM | POA: Diagnosis not present

## 2023-12-06 DIAGNOSIS — R079 Chest pain, unspecified: Secondary | ICD-10-CM | POA: Diagnosis not present

## 2023-12-06 DIAGNOSIS — K259 Gastric ulcer, unspecified as acute or chronic, without hemorrhage or perforation: Secondary | ICD-10-CM | POA: Diagnosis not present

## 2023-12-07 DIAGNOSIS — R4181 Age-related cognitive decline: Secondary | ICD-10-CM | POA: Diagnosis not present

## 2023-12-08 DIAGNOSIS — R4181 Age-related cognitive decline: Secondary | ICD-10-CM | POA: Diagnosis not present

## 2023-12-09 DIAGNOSIS — R4181 Age-related cognitive decline: Secondary | ICD-10-CM | POA: Diagnosis not present

## 2023-12-10 DIAGNOSIS — R4181 Age-related cognitive decline: Secondary | ICD-10-CM | POA: Diagnosis not present

## 2023-12-11 DIAGNOSIS — R4181 Age-related cognitive decline: Secondary | ICD-10-CM | POA: Diagnosis not present

## 2023-12-12 DIAGNOSIS — R4181 Age-related cognitive decline: Secondary | ICD-10-CM | POA: Diagnosis not present

## 2023-12-13 DIAGNOSIS — R4181 Age-related cognitive decline: Secondary | ICD-10-CM | POA: Diagnosis not present

## 2023-12-14 DIAGNOSIS — R4181 Age-related cognitive decline: Secondary | ICD-10-CM | POA: Diagnosis not present

## 2023-12-14 DIAGNOSIS — Z419 Encounter for procedure for purposes other than remedying health state, unspecified: Secondary | ICD-10-CM | POA: Diagnosis not present

## 2023-12-15 DIAGNOSIS — R4181 Age-related cognitive decline: Secondary | ICD-10-CM | POA: Diagnosis not present

## 2023-12-16 DIAGNOSIS — R4181 Age-related cognitive decline: Secondary | ICD-10-CM | POA: Diagnosis not present

## 2023-12-17 DIAGNOSIS — R4181 Age-related cognitive decline: Secondary | ICD-10-CM | POA: Diagnosis not present

## 2023-12-18 DIAGNOSIS — R4181 Age-related cognitive decline: Secondary | ICD-10-CM | POA: Diagnosis not present

## 2023-12-19 DIAGNOSIS — R4181 Age-related cognitive decline: Secondary | ICD-10-CM | POA: Diagnosis not present

## 2023-12-20 DIAGNOSIS — R4181 Age-related cognitive decline: Secondary | ICD-10-CM | POA: Diagnosis not present

## 2023-12-21 DIAGNOSIS — R4181 Age-related cognitive decline: Secondary | ICD-10-CM | POA: Diagnosis not present

## 2023-12-22 DIAGNOSIS — R4181 Age-related cognitive decline: Secondary | ICD-10-CM | POA: Diagnosis not present

## 2023-12-23 DIAGNOSIS — R4181 Age-related cognitive decline: Secondary | ICD-10-CM | POA: Diagnosis not present

## 2023-12-24 DIAGNOSIS — R4181 Age-related cognitive decline: Secondary | ICD-10-CM | POA: Diagnosis not present

## 2023-12-25 DIAGNOSIS — R4181 Age-related cognitive decline: Secondary | ICD-10-CM | POA: Diagnosis not present

## 2023-12-26 DIAGNOSIS — R4181 Age-related cognitive decline: Secondary | ICD-10-CM | POA: Diagnosis not present

## 2023-12-27 DIAGNOSIS — R4181 Age-related cognitive decline: Secondary | ICD-10-CM | POA: Diagnosis not present

## 2023-12-28 DIAGNOSIS — R4181 Age-related cognitive decline: Secondary | ICD-10-CM | POA: Diagnosis not present

## 2023-12-29 DIAGNOSIS — R4181 Age-related cognitive decline: Secondary | ICD-10-CM | POA: Diagnosis not present

## 2023-12-30 DIAGNOSIS — R4181 Age-related cognitive decline: Secondary | ICD-10-CM | POA: Diagnosis not present

## 2023-12-31 DIAGNOSIS — R4181 Age-related cognitive decline: Secondary | ICD-10-CM | POA: Diagnosis not present

## 2024-01-01 DIAGNOSIS — R4181 Age-related cognitive decline: Secondary | ICD-10-CM | POA: Diagnosis not present

## 2024-01-02 DIAGNOSIS — R4181 Age-related cognitive decline: Secondary | ICD-10-CM | POA: Diagnosis not present

## 2024-01-03 ENCOUNTER — Other Ambulatory Visit: Payer: Self-pay

## 2024-01-03 ENCOUNTER — Emergency Department (HOSPITAL_COMMUNITY)
Admission: EM | Admit: 2024-01-03 | Discharge: 2024-01-03 | Disposition: A | Discharge status: Home / Self Care | Payer: Medicaid Other | Attending: Emergency Medicine | Admitting: Emergency Medicine

## 2024-01-03 ENCOUNTER — Emergency Department (HOSPITAL_COMMUNITY): Payer: Medicaid Other

## 2024-01-03 DIAGNOSIS — R21 Rash and other nonspecific skin eruption: Secondary | ICD-10-CM | POA: Diagnosis present

## 2024-01-03 DIAGNOSIS — R4181 Age-related cognitive decline: Secondary | ICD-10-CM | POA: Diagnosis not present

## 2024-01-03 DIAGNOSIS — M7122 Synovial cyst of popliteal space [Baker], left knee: Secondary | ICD-10-CM | POA: Insufficient documentation

## 2024-01-03 DIAGNOSIS — L239 Allergic contact dermatitis, unspecified cause: Secondary | ICD-10-CM | POA: Diagnosis not present

## 2024-01-03 DIAGNOSIS — I10 Essential (primary) hypertension: Secondary | ICD-10-CM | POA: Diagnosis not present

## 2024-01-03 DIAGNOSIS — Z0389 Encounter for observation for other suspected diseases and conditions ruled out: Secondary | ICD-10-CM | POA: Diagnosis not present

## 2024-01-03 DIAGNOSIS — Z79899 Other long term (current) drug therapy: Secondary | ICD-10-CM | POA: Insufficient documentation

## 2024-01-03 MED ORDER — LORATADINE 10 MG PO TABS
10.0000 mg | ORAL_TABLET | Freq: Every day | ORAL | 2 refills | Status: AC
Start: 2024-01-03 — End: ?

## 2024-01-03 MED ORDER — LORATADINE 10 MG PO TABS
10.0000 mg | ORAL_TABLET | Freq: Every day | ORAL | 2 refills | Status: DC
  Filled 2024-01-03: qty 30, 30d supply, fill #0

## 2024-01-03 MED ORDER — LORATADINE 10 MG PO TABS
10.0000 mg | ORAL_TABLET | Freq: Once | ORAL | Status: AC
  Administered 2024-01-03: 10 mg via ORAL
  Filled 2024-01-03: qty 1

## 2024-01-03 NOTE — ED Provider Notes (Signed)
 [place] EMERGENCY DEPARTMENT AT [facility] Provider Note   CSN: [id number] Arrival date & time: 01/03/24  0845     History  Chief Complaint  Patient presents with   Rash    Alice Woodard is a 53 y.o. female past medical history significant for hypertension presents today for a rash "all over "and itching x 3 days.  Patient daughter states this has been going on for a year or more on and off.  Patient also endorses itchy eyes, congestion, and sinus pressure.  Patient is not currently taking any medication for her symptoms.  Patient was seen in July for similar and treated with Benadryl which the patient states helped with her symptoms.  Patient also concern for mass behind left knee that she has had for 2 to 3 years and has increased in size, but is minimally painful.     Rash      Home Medications Prior to Admission medications   Medication Sig Start Date End Date Taking? Authorizing Provider  loratadine (CLARITIN) 10 MG tablet Take 1 tablet (10 mg total) by mouth daily. 01/03/24  Yes Dolphus Jenny, PA-C  alum & mag hydroxide-simeth (MAALOX PLUS) 400-400-40 MG/5ML suspension Take 15 mLs by mouth every 6 (six) hours as needed for indigestion. 08/28/23   Mesner, Barbara Cower, MD  Blood Glucose Monitoring Suppl (ACCU-CHEK GUIDE) w/Device KIT USE DEVICE TO CHECK YOUR BLOOD PRESSURE THREE TO FOUR TIMES A WEEK Patient not taking: Reported on 09/17/2023 09/03/23   [provider]  Blood Pressure KIT by Does not apply route. Patient not taking: Reported on 09/17/2023 09/03/23   [provider]  dicyclomine (BENTYL) 10 MG capsule Dicyclomine 10 mg every 8 hours as needed. 09/24/23   Napoleon Form, MD  docusate sodium (COLACE) 100 MG capsule Take 1 capsule (100 mg total) by mouth every 12 (twelve) hours. 08/28/23   Mesner, Barbara Cower, MD  famotidine (PEPCID) 20 MG tablet Take 1 tablet (20 mg total) by mouth at bedtime. 11/29/22   Arnaldo Natal, NP   gabapentin (NEURONTIN) 300 MG capsule TAKE 1 CAPSULE (300 MG TOTAL) BY MOUTH AT BEDTIME. 09/13/20 09/24/23  Hoy Register, MD  hydrALAZINE (APRESOLINE) 10 MG tablet Take 10 mg by mouth 3 (three) times daily. 05/24/23   [provider]  losartan (COZAAR) 100 MG tablet Take 100 mg by mouth. 05/03/23   [provider]  ondansetron (ZOFRAN) 4 MG tablet Take 1 tablet (4 mg total) by mouth every 6 (six) hours. Patient not taking: Reported on 09/17/2023 08/26/23   Durwin Glaze, MD  ondansetron (ZOFRAN-ODT) 4 MG disintegrating tablet Take 1 tablet (4 mg total) by mouth every 8 (eight) hours as needed for nausea or vomiting. Patient not taking: Reported on 09/17/2023 08/30/23   Renne Crigler, PA-C  oxyCODONE-acetaminophen (PERCOCET/ROXICET) 5-325 MG tablet Take 1 tablet by mouth every 6 (six) hours as needed for severe pain (pain score 7-10). 08/30/23   Renne Crigler, PA-C  pantoprazole (PROTONIX) 40 MG tablet Take 1 tablet (40 mg total) by mouth daily. 09/24/23   Napoleon Form, MD  polyethylene glycol (MIRALAX / GLYCOLAX) 17 g packet Take 17 g by mouth daily. 08/28/23   Mesner, Barbara Cower, MD  sucralfate (CARAFATE) 1 g tablet Take 1 tablet (1 g total) by mouth 4 (four) times daily -  with meals and at bedtime. Patient not taking: Reported on 09/17/2023 08/30/23   Renne Crigler, PA-C  traZODone (DESYREL) 50 MG tablet Take 1 tablet (50 mg  total) by mouth at bedtime as needed for sleep. 09/18/22   Hoy Register, MD      Allergies    Amlodipine    Review of Systems   Review of Systems  Skin:  Positive for rash.    Physical Exam Updated Vital Signs BP (!) 192/114   Pulse 62   Temp 98.1 F (36.7 C)   Resp 16   LMP 11/15/2014   SpO2 100%  Physical Exam Vitals and nursing note reviewed.  Constitutional:      General: She is not in acute distress.    Appearance: Normal appearance. She is well-developed. She is not ill-appearing.  HENT:     Head: Normocephalic and  atraumatic.     Right Ear: External ear normal.     Left Ear: External ear normal.     Nose: Congestion present.     Mouth/Throat:     Mouth: Mucous membranes are moist.     Pharynx: Oropharynx is clear.  Eyes:     Extraocular Movements: Extraocular movements intact.     Conjunctiva/sclera: Conjunctivae normal.  Cardiovascular:     Rate and Rhythm: Normal rate and regular rhythm.     Heart sounds: No murmur heard. Pulmonary:     Effort: Pulmonary effort is normal. No respiratory distress.     Breath sounds: Normal breath sounds.  Abdominal:     Palpations: Abdomen is soft.  Musculoskeletal:        General: No swelling.     Cervical back: Normal range of motion and neck supple.     Comments: Patient does have a mobile, cystic like structure behind her left knee that is nontender to palpation.  Skin:    General: Skin is warm and dry.     Capillary Refill: Capillary refill takes less than 2 seconds.     Findings: Rash present.     Comments: Diffuse, mildly erythematous rash along patient's arms, abdomen, and bilateral legs.  Consistent with allergic type rash.  Neurological:     General: No focal deficit present.     Mental Status: She is alert.     Motor: No weakness.  Psychiatric:        Mood and Affect: Mood normal.     ED Results / Procedures / Treatments   Labs (all labs ordered are listed, but only abnormal results are displayed) Labs Reviewed - No data to display  EKG None  Radiology DG Knee Complete 4 Views Left Result Date: 01/03/2024 CLINICAL DATA:  Reported posterior mass EXAM: LEFT KNEE - COMPLETE 4 VIEW COMPARISON:  None Available. FINDINGS: No evidence of fracture, dislocation, or joint effusion, noting assessment is somewhat suboptimal due to oblique positioning on lateral view. Soft tissues are unremarkable. IMPRESSION: No focal radiographic abnormality to correspond to reported mass. Electronically Signed   By: Agustin Cree M.D.   On: 01/03/2024 10:43     Procedures Procedures    Medications Ordered in ED Medications  loratadine (CLARITIN) tablet 10 mg (10 mg Oral Given 01/03/24 1009)    ED Course/ Medical Decision Making/ A&P                                 Medical Decision Making Amount and/or Complexity of Data Reviewed Radiology: ordered.  Risk OTC drugs.   This patient presents to the ED with chief complaint(s) of rash and mass behind left knee with pertinent past medical history of  none which further complicates the presenting complaint. The complaint involves an extensive differential diagnosis and also carries with it a high risk of complications and morbidity.    The differential diagnosis includes Baker's cyst, tumor, syphilis, allergic dermatitis, SJS, TENS, anaphylaxis,  Additional history obtained: Additional history obtained from family  ED Course and Reassessment: Patient given loratadine while in ED   Independent visualization of imaging: - I independently visualized the following imaging with scope of interpretation limited to determining acute life threatening conditions related to emergency care: Left knee x-ray, which revealed no focal radiographic abnormality to correspond to reported mass  Consultation: - Consulted or discussed management/test interpretation w/ external professional: None  Consideration for admission or further workup: Considered for mission further workup however patient's vital signs, physical exam, and imaging were reassuring.  Patient symptoms likely due to allergic dermatitis and Baker's cyst.  Patient prescribed daily antihistamine and encouraged to follow-up with her primary care if her Baker's cyst worsens for further evaluation and treatment.         Final Clinical Impression(s) / ED Diagnoses Final diagnoses:  Allergic dermatitis  Baker's cyst of knee, left    Rx / DC Orders ED Discharge Orders          Ordered    loratadine (CLARITIN) 10 MG tablet  Daily         01/03/24 0959              Dolphus Jenny, PA-C 01/03/24 1053    Benjiman Core, MD 01/04/24 [phone]

## 2024-01-03 NOTE — ED Triage Notes (Addendum)
Pt/daughter stated, she has had a rash all over and itching all over for 3 days. It's been going on for a year or more off and on.

## 2024-01-03 NOTE — Discharge Instructions (Addendum)
They were seen for allergic dermatitis and a Baker's cyst of your left knee.  Please pick up your antihistamine and take as prescribed.  Thank you for letting us treat you today. After performing physical exam and reviewing your imaging, I feel you are safe to go home. Please follow up with your PCP in the next several days and provide them with your records from this visit. Return to the Emergency Room if pain becomes severe or symptoms worsen.

## 2024-01-04 DIAGNOSIS — R4181 Age-related cognitive decline: Secondary | ICD-10-CM | POA: Diagnosis not present

## 2024-01-05 DIAGNOSIS — R4181 Age-related cognitive decline: Secondary | ICD-10-CM | POA: Diagnosis not present

## 2024-01-06 DIAGNOSIS — R4181 Age-related cognitive decline: Secondary | ICD-10-CM | POA: Diagnosis not present

## 2024-01-07 DIAGNOSIS — R4181 Age-related cognitive decline: Secondary | ICD-10-CM | POA: Diagnosis not present

## 2024-01-08 DIAGNOSIS — R4181 Age-related cognitive decline: Secondary | ICD-10-CM | POA: Diagnosis not present

## 2024-01-09 DIAGNOSIS — R4181 Age-related cognitive decline: Secondary | ICD-10-CM | POA: Diagnosis not present

## 2024-01-10 DIAGNOSIS — R4181 Age-related cognitive decline: Secondary | ICD-10-CM | POA: Diagnosis not present

## 2024-01-10 DIAGNOSIS — R21 Rash and other nonspecific skin eruption: Secondary | ICD-10-CM | POA: Diagnosis not present

## 2024-01-10 DIAGNOSIS — H538 Other visual disturbances: Secondary | ICD-10-CM | POA: Diagnosis not present

## 2024-01-10 DIAGNOSIS — T7840XS Allergy, unspecified, sequela: Secondary | ICD-10-CM | POA: Diagnosis not present

## 2024-01-11 DIAGNOSIS — Z419 Encounter for procedure for purposes other than remedying health state, unspecified: Secondary | ICD-10-CM | POA: Diagnosis not present

## 2024-01-11 DIAGNOSIS — R4181 Age-related cognitive decline: Secondary | ICD-10-CM | POA: Diagnosis not present

## 2024-01-12 DIAGNOSIS — R4181 Age-related cognitive decline: Secondary | ICD-10-CM | POA: Diagnosis not present

## 2024-01-13 DIAGNOSIS — R4181 Age-related cognitive decline: Secondary | ICD-10-CM | POA: Diagnosis not present

## 2024-01-14 ENCOUNTER — Other Ambulatory Visit: Payer: Self-pay

## 2024-01-14 DIAGNOSIS — R4181 Age-related cognitive decline: Secondary | ICD-10-CM | POA: Diagnosis not present

## 2024-01-15 DIAGNOSIS — R4181 Age-related cognitive decline: Secondary | ICD-10-CM | POA: Diagnosis not present

## 2024-01-16 DIAGNOSIS — R4181 Age-related cognitive decline: Secondary | ICD-10-CM | POA: Diagnosis not present

## 2024-01-17 DIAGNOSIS — R4181 Age-related cognitive decline: Secondary | ICD-10-CM | POA: Diagnosis not present

## 2024-01-18 DIAGNOSIS — R4181 Age-related cognitive decline: Secondary | ICD-10-CM | POA: Diagnosis not present

## 2024-01-19 DIAGNOSIS — R4181 Age-related cognitive decline: Secondary | ICD-10-CM | POA: Diagnosis not present

## 2024-01-20 DIAGNOSIS — R4181 Age-related cognitive decline: Secondary | ICD-10-CM | POA: Diagnosis not present

## 2024-01-21 DIAGNOSIS — R4181 Age-related cognitive decline: Secondary | ICD-10-CM | POA: Diagnosis not present

## 2024-01-22 DIAGNOSIS — R4181 Age-related cognitive decline: Secondary | ICD-10-CM | POA: Diagnosis not present

## 2024-01-23 DIAGNOSIS — R4181 Age-related cognitive decline: Secondary | ICD-10-CM | POA: Diagnosis not present

## 2024-01-24 DIAGNOSIS — R4181 Age-related cognitive decline: Secondary | ICD-10-CM | POA: Diagnosis not present

## 2024-01-25 DIAGNOSIS — R4181 Age-related cognitive decline: Secondary | ICD-10-CM | POA: Diagnosis not present

## 2024-01-27 DIAGNOSIS — R4181 Age-related cognitive decline: Secondary | ICD-10-CM | POA: Diagnosis not present

## 2024-01-28 DIAGNOSIS — R4181 Age-related cognitive decline: Secondary | ICD-10-CM | POA: Diagnosis not present

## 2024-01-29 DIAGNOSIS — R4181 Age-related cognitive decline: Secondary | ICD-10-CM | POA: Diagnosis not present

## 2024-01-30 DIAGNOSIS — R4181 Age-related cognitive decline: Secondary | ICD-10-CM | POA: Diagnosis not present

## 2024-01-31 DIAGNOSIS — R4181 Age-related cognitive decline: Secondary | ICD-10-CM | POA: Diagnosis not present

## 2024-02-01 DIAGNOSIS — R4181 Age-related cognitive decline: Secondary | ICD-10-CM | POA: Diagnosis not present

## 2024-02-02 DIAGNOSIS — R4181 Age-related cognitive decline: Secondary | ICD-10-CM | POA: Diagnosis not present

## 2024-02-03 DIAGNOSIS — R4181 Age-related cognitive decline: Secondary | ICD-10-CM | POA: Diagnosis not present

## 2024-02-04 DIAGNOSIS — R3981 Functional urinary incontinence: Secondary | ICD-10-CM | POA: Diagnosis not present

## 2024-02-04 DIAGNOSIS — R4181 Age-related cognitive decline: Secondary | ICD-10-CM | POA: Diagnosis not present

## 2024-02-04 DIAGNOSIS — I10 Essential (primary) hypertension: Secondary | ICD-10-CM | POA: Diagnosis not present

## 2024-02-05 DIAGNOSIS — R4181 Age-related cognitive decline: Secondary | ICD-10-CM | POA: Diagnosis not present

## 2024-02-06 DIAGNOSIS — R21 Rash and other nonspecific skin eruption: Secondary | ICD-10-CM | POA: Diagnosis not present

## 2024-02-06 DIAGNOSIS — M7989 Other specified soft tissue disorders: Secondary | ICD-10-CM | POA: Diagnosis not present

## 2024-02-06 DIAGNOSIS — L299 Pruritus, unspecified: Secondary | ICD-10-CM | POA: Diagnosis not present

## 2024-02-06 DIAGNOSIS — R4181 Age-related cognitive decline: Secondary | ICD-10-CM | POA: Diagnosis not present

## 2024-02-06 DIAGNOSIS — Z23 Encounter for immunization: Secondary | ICD-10-CM | POA: Diagnosis not present

## 2024-02-07 DIAGNOSIS — R4181 Age-related cognitive decline: Secondary | ICD-10-CM | POA: Diagnosis not present

## 2024-02-08 DIAGNOSIS — R4181 Age-related cognitive decline: Secondary | ICD-10-CM | POA: Diagnosis not present

## 2024-02-09 DIAGNOSIS — R4181 Age-related cognitive decline: Secondary | ICD-10-CM | POA: Diagnosis not present

## 2024-02-10 DIAGNOSIS — R4181 Age-related cognitive decline: Secondary | ICD-10-CM | POA: Diagnosis not present

## 2024-02-11 DIAGNOSIS — R4181 Age-related cognitive decline: Secondary | ICD-10-CM | POA: Diagnosis not present

## 2024-02-12 DIAGNOSIS — R4181 Age-related cognitive decline: Secondary | ICD-10-CM | POA: Diagnosis not present

## 2024-02-13 DIAGNOSIS — R4181 Age-related cognitive decline: Secondary | ICD-10-CM | POA: Diagnosis not present

## 2024-02-14 DIAGNOSIS — R4181 Age-related cognitive decline: Secondary | ICD-10-CM | POA: Diagnosis not present

## 2024-02-15 DIAGNOSIS — R4181 Age-related cognitive decline: Secondary | ICD-10-CM | POA: Diagnosis not present

## 2024-02-17 DIAGNOSIS — R4181 Age-related cognitive decline: Secondary | ICD-10-CM | POA: Diagnosis not present

## 2024-02-18 DIAGNOSIS — R4181 Age-related cognitive decline: Secondary | ICD-10-CM | POA: Diagnosis not present

## 2024-02-19 DIAGNOSIS — R4181 Age-related cognitive decline: Secondary | ICD-10-CM | POA: Diagnosis not present

## 2024-02-20 DIAGNOSIS — R4181 Age-related cognitive decline: Secondary | ICD-10-CM | POA: Diagnosis not present

## 2024-02-21 DIAGNOSIS — M7122 Synovial cyst of popliteal space [Baker], left knee: Secondary | ICD-10-CM | POA: Diagnosis not present

## 2024-02-21 DIAGNOSIS — M7989 Other specified soft tissue disorders: Secondary | ICD-10-CM | POA: Diagnosis not present

## 2024-02-21 DIAGNOSIS — R4181 Age-related cognitive decline: Secondary | ICD-10-CM | POA: Diagnosis not present

## 2024-02-22 DIAGNOSIS — Z419 Encounter for procedure for purposes other than remedying health state, unspecified: Secondary | ICD-10-CM | POA: Diagnosis not present

## 2024-02-22 DIAGNOSIS — R4181 Age-related cognitive decline: Secondary | ICD-10-CM | POA: Diagnosis not present

## 2024-02-24 DIAGNOSIS — R4181 Age-related cognitive decline: Secondary | ICD-10-CM | POA: Diagnosis not present

## 2024-02-25 DIAGNOSIS — R4181 Age-related cognitive decline: Secondary | ICD-10-CM | POA: Diagnosis not present

## 2024-02-26 DIAGNOSIS — R4181 Age-related cognitive decline: Secondary | ICD-10-CM | POA: Diagnosis not present

## 2024-02-27 DIAGNOSIS — R4181 Age-related cognitive decline: Secondary | ICD-10-CM | POA: Diagnosis not present

## 2024-02-28 DIAGNOSIS — R4181 Age-related cognitive decline: Secondary | ICD-10-CM | POA: Diagnosis not present

## 2024-02-29 DIAGNOSIS — R4181 Age-related cognitive decline: Secondary | ICD-10-CM | POA: Diagnosis not present

## 2024-03-02 DIAGNOSIS — R4181 Age-related cognitive decline: Secondary | ICD-10-CM | POA: Diagnosis not present

## 2024-03-03 DIAGNOSIS — R4181 Age-related cognitive decline: Secondary | ICD-10-CM | POA: Diagnosis not present

## 2024-03-04 DIAGNOSIS — R4181 Age-related cognitive decline: Secondary | ICD-10-CM | POA: Diagnosis not present

## 2024-03-05 DIAGNOSIS — R4181 Age-related cognitive decline: Secondary | ICD-10-CM | POA: Diagnosis not present

## 2024-03-06 DIAGNOSIS — R4181 Age-related cognitive decline: Secondary | ICD-10-CM | POA: Diagnosis not present

## 2024-03-07 DIAGNOSIS — R4181 Age-related cognitive decline: Secondary | ICD-10-CM | POA: Diagnosis not present

## 2024-03-09 DIAGNOSIS — R4181 Age-related cognitive decline: Secondary | ICD-10-CM | POA: Diagnosis not present

## 2024-03-10 DIAGNOSIS — R4181 Age-related cognitive decline: Secondary | ICD-10-CM | POA: Diagnosis not present

## 2024-03-11 DIAGNOSIS — R4181 Age-related cognitive decline: Secondary | ICD-10-CM | POA: Diagnosis not present

## 2024-03-12 DIAGNOSIS — R4181 Age-related cognitive decline: Secondary | ICD-10-CM | POA: Diagnosis not present

## 2024-03-13 DIAGNOSIS — R4181 Age-related cognitive decline: Secondary | ICD-10-CM | POA: Diagnosis not present

## 2024-03-14 DIAGNOSIS — R4181 Age-related cognitive decline: Secondary | ICD-10-CM | POA: Diagnosis not present

## 2024-03-15 DIAGNOSIS — R4181 Age-related cognitive decline: Secondary | ICD-10-CM | POA: Diagnosis not present

## 2024-03-16 DIAGNOSIS — R4181 Age-related cognitive decline: Secondary | ICD-10-CM | POA: Diagnosis not present

## 2024-03-17 DIAGNOSIS — R4181 Age-related cognitive decline: Secondary | ICD-10-CM | POA: Diagnosis not present

## 2024-03-18 DIAGNOSIS — R4181 Age-related cognitive decline: Secondary | ICD-10-CM | POA: Diagnosis not present

## 2024-03-19 DIAGNOSIS — R4181 Age-related cognitive decline: Secondary | ICD-10-CM | POA: Diagnosis not present

## 2024-03-20 DIAGNOSIS — R4181 Age-related cognitive decline: Secondary | ICD-10-CM | POA: Diagnosis not present

## 2024-03-21 DIAGNOSIS — R4181 Age-related cognitive decline: Secondary | ICD-10-CM | POA: Diagnosis not present

## 2024-03-22 DIAGNOSIS — R4181 Age-related cognitive decline: Secondary | ICD-10-CM | POA: Diagnosis not present

## 2024-03-23 DIAGNOSIS — R4181 Age-related cognitive decline: Secondary | ICD-10-CM | POA: Diagnosis not present

## 2024-03-23 DIAGNOSIS — Z419 Encounter for procedure for purposes other than remedying health state, unspecified: Secondary | ICD-10-CM | POA: Diagnosis not present

## 2024-03-24 DIAGNOSIS — R4181 Age-related cognitive decline: Secondary | ICD-10-CM | POA: Diagnosis not present

## 2024-03-25 DIAGNOSIS — R4181 Age-related cognitive decline: Secondary | ICD-10-CM | POA: Diagnosis not present

## 2024-03-26 DIAGNOSIS — R4181 Age-related cognitive decline: Secondary | ICD-10-CM | POA: Diagnosis not present

## 2024-03-27 DIAGNOSIS — L299 Pruritus, unspecified: Secondary | ICD-10-CM | POA: Diagnosis not present

## 2024-03-27 DIAGNOSIS — R4181 Age-related cognitive decline: Secondary | ICD-10-CM | POA: Diagnosis not present

## 2024-03-28 DIAGNOSIS — R4181 Age-related cognitive decline: Secondary | ICD-10-CM | POA: Diagnosis not present

## 2024-03-29 DIAGNOSIS — R4181 Age-related cognitive decline: Secondary | ICD-10-CM | POA: Diagnosis not present

## 2024-03-30 DIAGNOSIS — R4181 Age-related cognitive decline: Secondary | ICD-10-CM | POA: Diagnosis not present

## 2024-03-31 DIAGNOSIS — R4181 Age-related cognitive decline: Secondary | ICD-10-CM | POA: Diagnosis not present

## 2024-04-01 DIAGNOSIS — R4181 Age-related cognitive decline: Secondary | ICD-10-CM | POA: Diagnosis not present

## 2024-04-02 DIAGNOSIS — R4181 Age-related cognitive decline: Secondary | ICD-10-CM | POA: Diagnosis not present

## 2024-04-03 DIAGNOSIS — R4181 Age-related cognitive decline: Secondary | ICD-10-CM | POA: Diagnosis not present

## 2024-04-04 DIAGNOSIS — R4181 Age-related cognitive decline: Secondary | ICD-10-CM | POA: Diagnosis not present

## 2024-04-05 DIAGNOSIS — R4181 Age-related cognitive decline: Secondary | ICD-10-CM | POA: Diagnosis not present

## 2024-04-06 DIAGNOSIS — R4181 Age-related cognitive decline: Secondary | ICD-10-CM | POA: Diagnosis not present

## 2024-04-07 DIAGNOSIS — R4181 Age-related cognitive decline: Secondary | ICD-10-CM | POA: Diagnosis not present

## 2024-04-08 DIAGNOSIS — R4181 Age-related cognitive decline: Secondary | ICD-10-CM | POA: Diagnosis not present

## 2024-04-09 DIAGNOSIS — I10 Essential (primary) hypertension: Secondary | ICD-10-CM | POA: Diagnosis not present

## 2024-04-09 DIAGNOSIS — R3981 Functional urinary incontinence: Secondary | ICD-10-CM | POA: Diagnosis not present

## 2024-04-09 DIAGNOSIS — R4181 Age-related cognitive decline: Secondary | ICD-10-CM | POA: Diagnosis not present

## 2024-04-10 DIAGNOSIS — R4181 Age-related cognitive decline: Secondary | ICD-10-CM | POA: Diagnosis not present

## 2024-04-11 DIAGNOSIS — R4181 Age-related cognitive decline: Secondary | ICD-10-CM | POA: Diagnosis not present

## 2024-04-12 DIAGNOSIS — R4181 Age-related cognitive decline: Secondary | ICD-10-CM | POA: Diagnosis not present

## 2024-04-13 DIAGNOSIS — R4181 Age-related cognitive decline: Secondary | ICD-10-CM | POA: Diagnosis not present

## 2024-04-14 DIAGNOSIS — R4181 Age-related cognitive decline: Secondary | ICD-10-CM | POA: Diagnosis not present

## 2024-04-15 DIAGNOSIS — R4181 Age-related cognitive decline: Secondary | ICD-10-CM | POA: Diagnosis not present

## 2024-04-16 DIAGNOSIS — R4181 Age-related cognitive decline: Secondary | ICD-10-CM | POA: Diagnosis not present

## 2024-04-17 DIAGNOSIS — M25561 Pain in right knee: Secondary | ICD-10-CM | POA: Diagnosis not present

## 2024-04-17 DIAGNOSIS — M25562 Pain in left knee: Secondary | ICD-10-CM | POA: Diagnosis not present

## 2024-04-17 DIAGNOSIS — I10 Essential (primary) hypertension: Secondary | ICD-10-CM | POA: Diagnosis not present

## 2024-04-17 DIAGNOSIS — S01531A Puncture wound without foreign body of lip, initial encounter: Secondary | ICD-10-CM | POA: Diagnosis not present

## 2024-04-17 DIAGNOSIS — S80212A Abrasion, left knee, initial encounter: Secondary | ICD-10-CM | POA: Diagnosis not present

## 2024-04-17 DIAGNOSIS — W1830XA Fall on same level, unspecified, initial encounter: Secondary | ICD-10-CM | POA: Diagnosis not present

## 2024-04-17 DIAGNOSIS — S80211A Abrasion, right knee, initial encounter: Secondary | ICD-10-CM | POA: Diagnosis not present

## 2024-04-17 DIAGNOSIS — R4181 Age-related cognitive decline: Secondary | ICD-10-CM | POA: Diagnosis not present

## 2024-04-18 DIAGNOSIS — R4181 Age-related cognitive decline: Secondary | ICD-10-CM | POA: Diagnosis not present

## 2024-04-19 DIAGNOSIS — R4181 Age-related cognitive decline: Secondary | ICD-10-CM | POA: Diagnosis not present

## 2024-04-20 DIAGNOSIS — R4181 Age-related cognitive decline: Secondary | ICD-10-CM | POA: Diagnosis not present

## 2024-04-21 DIAGNOSIS — R4181 Age-related cognitive decline: Secondary | ICD-10-CM | POA: Diagnosis not present

## 2024-04-22 DIAGNOSIS — R4181 Age-related cognitive decline: Secondary | ICD-10-CM | POA: Diagnosis not present

## 2024-04-23 DIAGNOSIS — Z419 Encounter for procedure for purposes other than remedying health state, unspecified: Secondary | ICD-10-CM | POA: Diagnosis not present

## 2024-04-23 DIAGNOSIS — R4181 Age-related cognitive decline: Secondary | ICD-10-CM | POA: Diagnosis not present

## 2024-04-24 DIAGNOSIS — R4181 Age-related cognitive decline: Secondary | ICD-10-CM | POA: Diagnosis not present

## 2024-04-25 DIAGNOSIS — R4181 Age-related cognitive decline: Secondary | ICD-10-CM | POA: Diagnosis not present

## 2024-04-26 DIAGNOSIS — R4181 Age-related cognitive decline: Secondary | ICD-10-CM | POA: Diagnosis not present

## 2024-04-27 DIAGNOSIS — R4181 Age-related cognitive decline: Secondary | ICD-10-CM | POA: Diagnosis not present

## 2024-04-28 DIAGNOSIS — R4181 Age-related cognitive decline: Secondary | ICD-10-CM | POA: Diagnosis not present

## 2024-04-29 DIAGNOSIS — R4181 Age-related cognitive decline: Secondary | ICD-10-CM | POA: Diagnosis not present

## 2024-04-30 DIAGNOSIS — R4181 Age-related cognitive decline: Secondary | ICD-10-CM | POA: Diagnosis not present

## 2024-05-01 DIAGNOSIS — R4181 Age-related cognitive decline: Secondary | ICD-10-CM | POA: Diagnosis not present

## 2024-05-02 DIAGNOSIS — R4181 Age-related cognitive decline: Secondary | ICD-10-CM | POA: Diagnosis not present

## 2024-05-03 DIAGNOSIS — R4181 Age-related cognitive decline: Secondary | ICD-10-CM | POA: Diagnosis not present

## 2024-05-04 DIAGNOSIS — R4181 Age-related cognitive decline: Secondary | ICD-10-CM | POA: Diagnosis not present

## 2024-05-05 DIAGNOSIS — R4181 Age-related cognitive decline: Secondary | ICD-10-CM | POA: Diagnosis not present

## 2024-05-06 DIAGNOSIS — R4181 Age-related cognitive decline: Secondary | ICD-10-CM | POA: Diagnosis not present

## 2024-05-07 DIAGNOSIS — R4181 Age-related cognitive decline: Secondary | ICD-10-CM | POA: Diagnosis not present

## 2024-05-08 DIAGNOSIS — R4182 Altered mental status, unspecified: Secondary | ICD-10-CM | POA: Diagnosis not present

## 2024-05-08 DIAGNOSIS — R519 Headache, unspecified: Secondary | ICD-10-CM | POA: Diagnosis not present

## 2024-05-08 DIAGNOSIS — R4181 Age-related cognitive decline: Secondary | ICD-10-CM | POA: Diagnosis not present

## 2024-05-08 DIAGNOSIS — W19XXXA Unspecified fall, initial encounter: Secondary | ICD-10-CM | POA: Diagnosis not present

## 2024-05-08 DIAGNOSIS — R42 Dizziness and giddiness: Secondary | ICD-10-CM | POA: Diagnosis not present

## 2024-05-09 DIAGNOSIS — R4181 Age-related cognitive decline: Secondary | ICD-10-CM | POA: Diagnosis not present

## 2024-05-09 DIAGNOSIS — R3981 Functional urinary incontinence: Secondary | ICD-10-CM | POA: Diagnosis not present

## 2024-05-09 DIAGNOSIS — I10 Essential (primary) hypertension: Secondary | ICD-10-CM | POA: Diagnosis not present

## 2024-05-10 DIAGNOSIS — R4181 Age-related cognitive decline: Secondary | ICD-10-CM | POA: Diagnosis not present

## 2024-05-11 DIAGNOSIS — R4181 Age-related cognitive decline: Secondary | ICD-10-CM | POA: Diagnosis not present

## 2024-05-12 DIAGNOSIS — R4181 Age-related cognitive decline: Secondary | ICD-10-CM | POA: Diagnosis not present

## 2024-05-13 DIAGNOSIS — R4181 Age-related cognitive decline: Secondary | ICD-10-CM | POA: Diagnosis not present

## 2024-05-13 DIAGNOSIS — R92323 Mammographic fibroglandular density, bilateral breasts: Secondary | ICD-10-CM | POA: Diagnosis not present

## 2024-05-13 DIAGNOSIS — Z1231 Encounter for screening mammogram for malignant neoplasm of breast: Secondary | ICD-10-CM | POA: Diagnosis not present

## 2024-05-14 DIAGNOSIS — R4181 Age-related cognitive decline: Secondary | ICD-10-CM | POA: Diagnosis not present

## 2024-05-15 DIAGNOSIS — R4181 Age-related cognitive decline: Secondary | ICD-10-CM | POA: Diagnosis not present

## 2024-05-16 DIAGNOSIS — R4181 Age-related cognitive decline: Secondary | ICD-10-CM | POA: Diagnosis not present

## 2024-05-17 DIAGNOSIS — R4181 Age-related cognitive decline: Secondary | ICD-10-CM | POA: Diagnosis not present

## 2024-05-18 DIAGNOSIS — R4181 Age-related cognitive decline: Secondary | ICD-10-CM | POA: Diagnosis not present

## 2024-05-19 DIAGNOSIS — R4181 Age-related cognitive decline: Secondary | ICD-10-CM | POA: Diagnosis not present

## 2024-05-20 DIAGNOSIS — R4181 Age-related cognitive decline: Secondary | ICD-10-CM | POA: Diagnosis not present

## 2024-05-21 DIAGNOSIS — R4181 Age-related cognitive decline: Secondary | ICD-10-CM | POA: Diagnosis not present

## 2024-05-21 DIAGNOSIS — H40053 Ocular hypertension, bilateral: Secondary | ICD-10-CM | POA: Diagnosis not present

## 2024-05-21 DIAGNOSIS — H25813 Combined forms of age-related cataract, bilateral: Secondary | ICD-10-CM | POA: Diagnosis not present

## 2024-05-22 DIAGNOSIS — R4181 Age-related cognitive decline: Secondary | ICD-10-CM | POA: Diagnosis not present

## 2024-05-23 DIAGNOSIS — R4181 Age-related cognitive decline: Secondary | ICD-10-CM | POA: Diagnosis not present

## 2024-05-23 DIAGNOSIS — Z419 Encounter for procedure for purposes other than remedying health state, unspecified: Secondary | ICD-10-CM | POA: Diagnosis not present

## 2024-05-24 DIAGNOSIS — R4181 Age-related cognitive decline: Secondary | ICD-10-CM | POA: Diagnosis not present

## 2024-05-25 DIAGNOSIS — R4181 Age-related cognitive decline: Secondary | ICD-10-CM | POA: Diagnosis not present

## 2024-05-26 DIAGNOSIS — R4181 Age-related cognitive decline: Secondary | ICD-10-CM | POA: Diagnosis not present

## 2024-05-27 DIAGNOSIS — R4181 Age-related cognitive decline: Secondary | ICD-10-CM | POA: Diagnosis not present

## 2024-05-28 DIAGNOSIS — R4181 Age-related cognitive decline: Secondary | ICD-10-CM | POA: Diagnosis not present

## 2024-05-29 DIAGNOSIS — R4181 Age-related cognitive decline: Secondary | ICD-10-CM | POA: Diagnosis not present

## 2024-05-30 DIAGNOSIS — R4181 Age-related cognitive decline: Secondary | ICD-10-CM | POA: Diagnosis not present

## 2024-05-31 DIAGNOSIS — R4181 Age-related cognitive decline: Secondary | ICD-10-CM | POA: Diagnosis not present

## 2024-06-01 DIAGNOSIS — R4181 Age-related cognitive decline: Secondary | ICD-10-CM | POA: Diagnosis not present

## 2024-06-02 DIAGNOSIS — R4181 Age-related cognitive decline: Secondary | ICD-10-CM | POA: Diagnosis not present

## 2024-06-03 DIAGNOSIS — R4181 Age-related cognitive decline: Secondary | ICD-10-CM | POA: Diagnosis not present

## 2024-06-04 DIAGNOSIS — R4181 Age-related cognitive decline: Secondary | ICD-10-CM | POA: Diagnosis not present

## 2024-06-05 DIAGNOSIS — R4181 Age-related cognitive decline: Secondary | ICD-10-CM | POA: Diagnosis not present

## 2024-06-06 DIAGNOSIS — R4181 Age-related cognitive decline: Secondary | ICD-10-CM | POA: Diagnosis not present

## 2024-06-07 DIAGNOSIS — R4181 Age-related cognitive decline: Secondary | ICD-10-CM | POA: Diagnosis not present

## 2024-06-08 DIAGNOSIS — I10 Essential (primary) hypertension: Secondary | ICD-10-CM | POA: Diagnosis not present

## 2024-06-08 DIAGNOSIS — R4181 Age-related cognitive decline: Secondary | ICD-10-CM | POA: Diagnosis not present

## 2024-06-08 DIAGNOSIS — R3981 Functional urinary incontinence: Secondary | ICD-10-CM | POA: Diagnosis not present

## 2024-06-09 DIAGNOSIS — F330 Major depressive disorder, recurrent, mild: Secondary | ICD-10-CM | POA: Diagnosis not present

## 2024-06-09 DIAGNOSIS — R4181 Age-related cognitive decline: Secondary | ICD-10-CM | POA: Diagnosis not present

## 2024-06-09 DIAGNOSIS — Z0000 Encounter for general adult medical examination without abnormal findings: Secondary | ICD-10-CM | POA: Diagnosis not present

## 2024-06-09 DIAGNOSIS — Z23 Encounter for immunization: Secondary | ICD-10-CM | POA: Diagnosis not present

## 2024-06-09 DIAGNOSIS — K760 Fatty (change of) liver, not elsewhere classified: Secondary | ICD-10-CM | POA: Diagnosis not present

## 2024-06-09 DIAGNOSIS — Z131 Encounter for screening for diabetes mellitus: Secondary | ICD-10-CM | POA: Diagnosis not present

## 2024-06-09 DIAGNOSIS — E559 Vitamin D deficiency, unspecified: Secondary | ICD-10-CM | POA: Diagnosis not present

## 2024-06-09 DIAGNOSIS — Z6822 Body mass index (BMI) 22.0-22.9, adult: Secondary | ICD-10-CM | POA: Diagnosis not present

## 2024-06-09 DIAGNOSIS — I10 Essential (primary) hypertension: Secondary | ICD-10-CM | POA: Diagnosis not present

## 2024-06-09 DIAGNOSIS — E785 Hyperlipidemia, unspecified: Secondary | ICD-10-CM | POA: Diagnosis not present

## 2024-06-09 DIAGNOSIS — E538 Deficiency of other specified B group vitamins: Secondary | ICD-10-CM | POA: Diagnosis not present

## 2024-06-10 DIAGNOSIS — R4181 Age-related cognitive decline: Secondary | ICD-10-CM | POA: Diagnosis not present

## 2024-06-11 DIAGNOSIS — R4181 Age-related cognitive decline: Secondary | ICD-10-CM | POA: Diagnosis not present

## 2024-06-12 DIAGNOSIS — R4181 Age-related cognitive decline: Secondary | ICD-10-CM | POA: Diagnosis not present

## 2024-06-13 DIAGNOSIS — R4181 Age-related cognitive decline: Secondary | ICD-10-CM | POA: Diagnosis not present

## 2024-06-14 DIAGNOSIS — R4181 Age-related cognitive decline: Secondary | ICD-10-CM | POA: Diagnosis not present

## 2024-06-15 DIAGNOSIS — R4181 Age-related cognitive decline: Secondary | ICD-10-CM | POA: Diagnosis not present

## 2024-06-16 DIAGNOSIS — R4181 Age-related cognitive decline: Secondary | ICD-10-CM | POA: Diagnosis not present

## 2024-06-17 DIAGNOSIS — R4181 Age-related cognitive decline: Secondary | ICD-10-CM | POA: Diagnosis not present

## 2024-06-18 DIAGNOSIS — R4181 Age-related cognitive decline: Secondary | ICD-10-CM | POA: Diagnosis not present

## 2024-06-19 DIAGNOSIS — R4181 Age-related cognitive decline: Secondary | ICD-10-CM | POA: Diagnosis not present

## 2024-06-20 DIAGNOSIS — R4181 Age-related cognitive decline: Secondary | ICD-10-CM | POA: Diagnosis not present

## 2024-06-21 DIAGNOSIS — R4181 Age-related cognitive decline: Secondary | ICD-10-CM | POA: Diagnosis not present

## 2024-06-22 DIAGNOSIS — R4181 Age-related cognitive decline: Secondary | ICD-10-CM | POA: Diagnosis not present

## 2024-06-23 DIAGNOSIS — Z419 Encounter for procedure for purposes other than remedying health state, unspecified: Secondary | ICD-10-CM | POA: Diagnosis not present

## 2024-06-23 DIAGNOSIS — R4181 Age-related cognitive decline: Secondary | ICD-10-CM | POA: Diagnosis not present

## 2024-06-24 ENCOUNTER — Other Ambulatory Visit: Payer: Self-pay

## 2024-06-24 ENCOUNTER — Emergency Department (HOSPITAL_COMMUNITY): Admission: EM | Admit: 2024-06-24 | Discharge: 2024-06-25 | Discharge status: Against Medical Advice

## 2024-06-24 ENCOUNTER — Encounter (HOSPITAL_COMMUNITY): Payer: Self-pay | Admitting: *Deleted

## 2024-06-24 ENCOUNTER — Emergency Department (HOSPITAL_COMMUNITY)

## 2024-06-24 DIAGNOSIS — R1012 Left upper quadrant pain: Secondary | ICD-10-CM | POA: Diagnosis not present

## 2024-06-24 DIAGNOSIS — R110 Nausea: Secondary | ICD-10-CM | POA: Diagnosis not present

## 2024-06-24 DIAGNOSIS — R4181 Age-related cognitive decline: Secondary | ICD-10-CM | POA: Diagnosis not present

## 2024-06-24 DIAGNOSIS — R0602 Shortness of breath: Secondary | ICD-10-CM | POA: Insufficient documentation

## 2024-06-24 DIAGNOSIS — R1013 Epigastric pain: Secondary | ICD-10-CM | POA: Insufficient documentation

## 2024-06-24 DIAGNOSIS — R9431 Abnormal electrocardiogram [ECG] [EKG]: Secondary | ICD-10-CM | POA: Diagnosis not present

## 2024-06-24 DIAGNOSIS — R918 Other nonspecific abnormal finding of lung field: Secondary | ICD-10-CM | POA: Diagnosis not present

## 2024-06-24 DIAGNOSIS — Z5321 Procedure and treatment not carried out due to patient leaving prior to being seen by health care provider: Secondary | ICD-10-CM | POA: Diagnosis not present

## 2024-06-24 LAB — CBC WITH DIFFERENTIAL/PLATELET
Abs Immature Granulocytes: 0.05 K/uL (ref 0.00–0.07)
Basophils Absolute: 0.1 K/uL (ref 0.0–0.1)
Basophils Relative: 1 %
Eosinophils Absolute: 0.3 K/uL (ref 0.0–0.5)
Eosinophils Relative: 2 %
HCT: 35.1 % — ABNORMAL LOW (ref 36.0–46.0)
Hemoglobin: 12 g/dL (ref 12.0–15.0)
Immature Granulocytes: 0 %
Lymphocytes Relative: 19 %
Lymphs Abs: 2.5 K/uL (ref 0.7–4.0)
MCH: 30.5 pg (ref 26.0–34.0)
MCHC: 34.2 g/dL (ref 30.0–36.0)
MCV: 89.1 fL (ref 80.0–100.0)
Monocytes Absolute: 1 K/uL (ref 0.1–1.0)
Monocytes Relative: 8 %
Neutro Abs: 9.2 K/uL — ABNORMAL HIGH (ref 1.7–7.7)
Neutrophils Relative %: 70 %
Platelets: 282 K/uL (ref 150–400)
RBC: 3.94 MIL/uL (ref 3.87–5.11)
RDW: 12.8 % (ref 11.5–15.5)
WBC: 13 K/uL — ABNORMAL HIGH (ref 4.0–10.5)
nRBC: 0 % (ref 0.0–0.2)

## 2024-06-24 LAB — COMPREHENSIVE METABOLIC PANEL WITH GFR
ALT: 12 U/L (ref 0–44)
AST: 21 U/L (ref 15–41)
Albumin: 3.5 g/dL (ref 3.5–5.0)
Alkaline Phosphatase: 85 U/L (ref 38–126)
Anion gap: 9 (ref 5–15)
BUN: 12 mg/dL (ref 6–20)
CO2: 24 mmol/L (ref 22–32)
Calcium: 8.9 mg/dL (ref 8.9–10.3)
Chloride: 107 mmol/L (ref 98–111)
Creatinine, Ser: 0.82 mg/dL (ref 0.44–1.00)
GFR, Estimated: >60 mL/min (ref >60)
Glucose, Bld: 111 mg/dL — ABNORMAL HIGH (ref 70–99)
Potassium: 3.9 mmol/L (ref 3.5–5.1)
Sodium: 140 mmol/L (ref 135–145)
Total Bilirubin: 1.2 mg/dL (ref 0.0–1.2)
Total Protein: 6.5 g/dL (ref 6.5–8.1)

## 2024-06-24 LAB — LIPASE, BLOOD: Lipase: 70 U/L — ABNORMAL HIGH (ref 11–51)

## 2024-06-24 MED ORDER — LIDOCAINE VISCOUS HCL 2 % MT SOLN
15.0000 mL | Freq: Once | OROMUCOSAL | Status: AC
  Administered 2024-06-24 (×2): 15 mL via ORAL
  Filled 2024-06-24: qty 15

## 2024-06-24 MED ORDER — ALUM & MAG HYDROXIDE-SIMETH 200-200-20 MG/5ML PO SUSP
30.0000 mL | Freq: Once | ORAL | Status: AC
  Administered 2024-06-24 (×2): 30 mL via ORAL
  Filled 2024-06-24: qty 30

## 2024-06-24 NOTE — ED Triage Notes (Signed)
 The pt  is c/o epigastric pain and sob since yesterday

## 2024-06-24 NOTE — ED Provider Triage Note (Signed)
 Emergency Medicine Provider Triage Evaluation Note  Karisa Nesser , a 53 y.o. female  was evaluated in triage.  Pt complains of abdominal for a few days in the left upper quadrant and epigastric region. States today it is accompanied by shortness of breath which prompted family at bedside to come to the ER.  Review of Systems  Positive: Abdominal pain, nausea, shortness of breath  Negative: Chest pain, leg swelling, vomiting   Physical Exam  BP (!) 159/100 (BP Location: Right Arm)   Pulse 65   Temp 98.4 F (36.9 C)   Resp 20   Ht 5' (1.524 m)   Wt 51.2 kg   LMP 11/15/2014   SpO2 99%   BMI 22.04 kg/m  Gen:   Awake, no distress   Resp:  Normal effort  MSK:   Moves extremities without difficulty   Medical Decision Making  Medically screening exam initiated at 10:15 PM.  Appropriate orders placed.  Rokhaya Greenley was informed that the remainder of the evaluation will be completed by another provider, this initial triage assessment does not replace that evaluation, and the importance of remaining in the ED until their evaluation is complete.     Gennaro Duwaine CROME, DO 06/24/24 2215

## 2024-06-25 DIAGNOSIS — R4181 Age-related cognitive decline: Secondary | ICD-10-CM | POA: Diagnosis not present

## 2024-06-25 NOTE — ED Notes (Signed)
 Pt stated they were leaving due to feeling better, educated we do not recommend but they decided to leave anyway. Moved OTF

## 2024-06-26 DIAGNOSIS — R4181 Age-related cognitive decline: Secondary | ICD-10-CM | POA: Diagnosis not present

## 2024-06-27 DIAGNOSIS — R4181 Age-related cognitive decline: Secondary | ICD-10-CM | POA: Diagnosis not present

## 2024-06-28 DIAGNOSIS — R4181 Age-related cognitive decline: Secondary | ICD-10-CM | POA: Diagnosis not present

## 2024-06-29 ENCOUNTER — Ambulatory Visit: Admitting: Dermatology

## 2024-06-29 DIAGNOSIS — R4181 Age-related cognitive decline: Secondary | ICD-10-CM | POA: Diagnosis not present

## 2024-06-30 DIAGNOSIS — R4181 Age-related cognitive decline: Secondary | ICD-10-CM | POA: Diagnosis not present

## 2024-07-01 DIAGNOSIS — R4181 Age-related cognitive decline: Secondary | ICD-10-CM | POA: Diagnosis not present

## 2024-07-02 DIAGNOSIS — R4181 Age-related cognitive decline: Secondary | ICD-10-CM | POA: Diagnosis not present

## 2024-07-03 DIAGNOSIS — R4181 Age-related cognitive decline: Secondary | ICD-10-CM | POA: Diagnosis not present

## 2024-07-04 DIAGNOSIS — R4181 Age-related cognitive decline: Secondary | ICD-10-CM | POA: Diagnosis not present

## 2024-07-05 DIAGNOSIS — R4181 Age-related cognitive decline: Secondary | ICD-10-CM | POA: Diagnosis not present

## 2024-07-06 DIAGNOSIS — R4181 Age-related cognitive decline: Secondary | ICD-10-CM | POA: Diagnosis not present

## 2024-07-07 DIAGNOSIS — R4181 Age-related cognitive decline: Secondary | ICD-10-CM | POA: Diagnosis not present

## 2024-07-08 DIAGNOSIS — R4181 Age-related cognitive decline: Secondary | ICD-10-CM | POA: Diagnosis not present

## 2024-07-09 DIAGNOSIS — R4181 Age-related cognitive decline: Secondary | ICD-10-CM | POA: Diagnosis not present

## 2024-07-10 DIAGNOSIS — R4181 Age-related cognitive decline: Secondary | ICD-10-CM | POA: Diagnosis not present

## 2024-07-11 DIAGNOSIS — R4181 Age-related cognitive decline: Secondary | ICD-10-CM | POA: Diagnosis not present

## 2024-07-12 DIAGNOSIS — R4181 Age-related cognitive decline: Secondary | ICD-10-CM | POA: Diagnosis not present

## 2024-07-13 DIAGNOSIS — R4181 Age-related cognitive decline: Secondary | ICD-10-CM | POA: Diagnosis not present

## 2024-07-14 DIAGNOSIS — R4181 Age-related cognitive decline: Secondary | ICD-10-CM | POA: Diagnosis not present

## 2024-07-15 DIAGNOSIS — R4181 Age-related cognitive decline: Secondary | ICD-10-CM | POA: Diagnosis not present

## 2024-07-16 DIAGNOSIS — R4181 Age-related cognitive decline: Secondary | ICD-10-CM | POA: Diagnosis not present

## 2024-07-17 DIAGNOSIS — R4181 Age-related cognitive decline: Secondary | ICD-10-CM | POA: Diagnosis not present

## 2024-07-18 DIAGNOSIS — R4181 Age-related cognitive decline: Secondary | ICD-10-CM | POA: Diagnosis not present

## 2024-07-19 DIAGNOSIS — R4181 Age-related cognitive decline: Secondary | ICD-10-CM | POA: Diagnosis not present

## 2024-07-20 DIAGNOSIS — R4181 Age-related cognitive decline: Secondary | ICD-10-CM | POA: Diagnosis not present

## 2024-07-21 DIAGNOSIS — R4181 Age-related cognitive decline: Secondary | ICD-10-CM | POA: Diagnosis not present

## 2024-07-22 DIAGNOSIS — R4181 Age-related cognitive decline: Secondary | ICD-10-CM | POA: Diagnosis not present

## 2024-07-23 DIAGNOSIS — R4181 Age-related cognitive decline: Secondary | ICD-10-CM | POA: Diagnosis not present

## 2024-07-24 DIAGNOSIS — R4181 Age-related cognitive decline: Secondary | ICD-10-CM | POA: Diagnosis not present

## 2024-07-24 DIAGNOSIS — Z419 Encounter for procedure for purposes other than remedying health state, unspecified: Secondary | ICD-10-CM | POA: Diagnosis not present

## 2024-07-25 DIAGNOSIS — R4181 Age-related cognitive decline: Secondary | ICD-10-CM | POA: Diagnosis not present

## 2024-07-26 DIAGNOSIS — R4181 Age-related cognitive decline: Secondary | ICD-10-CM | POA: Diagnosis not present

## 2024-07-27 DIAGNOSIS — R4181 Age-related cognitive decline: Secondary | ICD-10-CM | POA: Diagnosis not present

## 2024-07-28 DIAGNOSIS — R4181 Age-related cognitive decline: Secondary | ICD-10-CM | POA: Diagnosis not present

## 2024-07-29 DIAGNOSIS — R4181 Age-related cognitive decline: Secondary | ICD-10-CM | POA: Diagnosis not present

## 2024-07-30 DIAGNOSIS — R4181 Age-related cognitive decline: Secondary | ICD-10-CM | POA: Diagnosis not present

## 2024-07-31 DIAGNOSIS — R4181 Age-related cognitive decline: Secondary | ICD-10-CM | POA: Diagnosis not present

## 2024-08-01 DIAGNOSIS — R4181 Age-related cognitive decline: Secondary | ICD-10-CM | POA: Diagnosis not present

## 2024-08-02 DIAGNOSIS — R4181 Age-related cognitive decline: Secondary | ICD-10-CM | POA: Diagnosis not present

## 2024-08-03 DIAGNOSIS — R4181 Age-related cognitive decline: Secondary | ICD-10-CM | POA: Diagnosis not present

## 2024-08-04 DIAGNOSIS — R4181 Age-related cognitive decline: Secondary | ICD-10-CM | POA: Diagnosis not present

## 2024-08-05 DIAGNOSIS — R4181 Age-related cognitive decline: Secondary | ICD-10-CM | POA: Diagnosis not present

## 2024-08-06 DIAGNOSIS — R4181 Age-related cognitive decline: Secondary | ICD-10-CM | POA: Diagnosis not present

## 2024-08-07 DIAGNOSIS — R4181 Age-related cognitive decline: Secondary | ICD-10-CM | POA: Diagnosis not present

## 2024-08-08 DIAGNOSIS — R4181 Age-related cognitive decline: Secondary | ICD-10-CM | POA: Diagnosis not present

## 2024-08-09 DIAGNOSIS — R4181 Age-related cognitive decline: Secondary | ICD-10-CM | POA: Diagnosis not present

## 2024-08-10 DIAGNOSIS — R4181 Age-related cognitive decline: Secondary | ICD-10-CM | POA: Diagnosis not present

## 2024-08-11 DIAGNOSIS — R4181 Age-related cognitive decline: Secondary | ICD-10-CM | POA: Diagnosis not present

## 2024-08-12 DIAGNOSIS — R4181 Age-related cognitive decline: Secondary | ICD-10-CM | POA: Diagnosis not present

## 2024-08-13 DIAGNOSIS — R4181 Age-related cognitive decline: Secondary | ICD-10-CM | POA: Diagnosis not present

## 2024-08-14 DIAGNOSIS — R4181 Age-related cognitive decline: Secondary | ICD-10-CM | POA: Diagnosis not present

## 2024-08-15 DIAGNOSIS — R4181 Age-related cognitive decline: Secondary | ICD-10-CM | POA: Diagnosis not present

## 2024-08-16 DIAGNOSIS — R4181 Age-related cognitive decline: Secondary | ICD-10-CM | POA: Diagnosis not present

## 2024-08-17 DIAGNOSIS — R4181 Age-related cognitive decline: Secondary | ICD-10-CM | POA: Diagnosis not present

## 2024-08-18 DIAGNOSIS — R4181 Age-related cognitive decline: Secondary | ICD-10-CM | POA: Diagnosis not present

## 2024-08-19 DIAGNOSIS — R4181 Age-related cognitive decline: Secondary | ICD-10-CM | POA: Diagnosis not present

## 2024-08-20 DIAGNOSIS — R4181 Age-related cognitive decline: Secondary | ICD-10-CM | POA: Diagnosis not present

## 2024-08-21 DIAGNOSIS — R4181 Age-related cognitive decline: Secondary | ICD-10-CM | POA: Diagnosis not present

## 2024-08-22 DIAGNOSIS — R4181 Age-related cognitive decline: Secondary | ICD-10-CM | POA: Diagnosis not present

## 2024-08-23 DIAGNOSIS — R4181 Age-related cognitive decline: Secondary | ICD-10-CM | POA: Diagnosis not present

## 2024-08-24 DIAGNOSIS — R4181 Age-related cognitive decline: Secondary | ICD-10-CM | POA: Diagnosis not present

## 2024-08-25 DIAGNOSIS — R4181 Age-related cognitive decline: Secondary | ICD-10-CM | POA: Diagnosis not present

## 2024-08-26 DIAGNOSIS — R4181 Age-related cognitive decline: Secondary | ICD-10-CM | POA: Diagnosis not present

## 2024-08-26 DIAGNOSIS — I10 Essential (primary) hypertension: Secondary | ICD-10-CM | POA: Diagnosis not present

## 2024-08-26 DIAGNOSIS — H9319 Tinnitus, unspecified ear: Secondary | ICD-10-CM | POA: Diagnosis not present

## 2024-08-26 DIAGNOSIS — R430 Anosmia: Secondary | ICD-10-CM | POA: Diagnosis not present

## 2024-08-27 DIAGNOSIS — R4181 Age-related cognitive decline: Secondary | ICD-10-CM | POA: Diagnosis not present

## 2024-08-28 DIAGNOSIS — R4181 Age-related cognitive decline: Secondary | ICD-10-CM | POA: Diagnosis not present

## 2024-08-29 DIAGNOSIS — R4181 Age-related cognitive decline: Secondary | ICD-10-CM | POA: Diagnosis not present

## 2024-08-30 DIAGNOSIS — R4181 Age-related cognitive decline: Secondary | ICD-10-CM | POA: Diagnosis not present

## 2024-08-31 DIAGNOSIS — R4181 Age-related cognitive decline: Secondary | ICD-10-CM | POA: Diagnosis not present

## 2024-09-01 DIAGNOSIS — R4181 Age-related cognitive decline: Secondary | ICD-10-CM | POA: Diagnosis not present

## 2024-09-02 DIAGNOSIS — R4181 Age-related cognitive decline: Secondary | ICD-10-CM | POA: Diagnosis not present

## 2024-09-03 DIAGNOSIS — R4181 Age-related cognitive decline: Secondary | ICD-10-CM | POA: Diagnosis not present

## 2024-09-04 DIAGNOSIS — R4181 Age-related cognitive decline: Secondary | ICD-10-CM | POA: Diagnosis not present

## 2024-09-05 DIAGNOSIS — R4181 Age-related cognitive decline: Secondary | ICD-10-CM | POA: Diagnosis not present

## 2024-09-06 DIAGNOSIS — R4181 Age-related cognitive decline: Secondary | ICD-10-CM | POA: Diagnosis not present

## 2024-09-07 DIAGNOSIS — R4181 Age-related cognitive decline: Secondary | ICD-10-CM | POA: Diagnosis not present

## 2024-09-08 DIAGNOSIS — R4181 Age-related cognitive decline: Secondary | ICD-10-CM | POA: Diagnosis not present

## 2024-09-09 DIAGNOSIS — R4181 Age-related cognitive decline: Secondary | ICD-10-CM | POA: Diagnosis not present

## 2024-09-10 DIAGNOSIS — R4181 Age-related cognitive decline: Secondary | ICD-10-CM | POA: Diagnosis not present

## 2024-09-11 DIAGNOSIS — R4181 Age-related cognitive decline: Secondary | ICD-10-CM | POA: Diagnosis not present

## 2024-09-12 DIAGNOSIS — R4181 Age-related cognitive decline: Secondary | ICD-10-CM | POA: Diagnosis not present

## 2024-09-13 DIAGNOSIS — R4181 Age-related cognitive decline: Secondary | ICD-10-CM | POA: Diagnosis not present

## 2024-09-14 DIAGNOSIS — R4181 Age-related cognitive decline: Secondary | ICD-10-CM | POA: Diagnosis not present

## 2024-09-15 DIAGNOSIS — R4181 Age-related cognitive decline: Secondary | ICD-10-CM | POA: Diagnosis not present

## 2024-09-16 DIAGNOSIS — R4181 Age-related cognitive decline: Secondary | ICD-10-CM | POA: Diagnosis not present

## 2024-09-17 DIAGNOSIS — R0600 Dyspnea, unspecified: Secondary | ICD-10-CM | POA: Diagnosis not present

## 2024-09-17 DIAGNOSIS — R4181 Age-related cognitive decline: Secondary | ICD-10-CM | POA: Diagnosis not present

## 2024-09-17 DIAGNOSIS — R051 Acute cough: Secondary | ICD-10-CM | POA: Diagnosis not present

## 2024-09-17 DIAGNOSIS — J029 Acute pharyngitis, unspecified: Secondary | ICD-10-CM | POA: Diagnosis not present

## 2024-09-18 DIAGNOSIS — R4181 Age-related cognitive decline: Secondary | ICD-10-CM | POA: Diagnosis not present

## 2024-09-20 DIAGNOSIS — R4181 Age-related cognitive decline: Secondary | ICD-10-CM | POA: Diagnosis not present

## 2024-09-21 DIAGNOSIS — R4181 Age-related cognitive decline: Secondary | ICD-10-CM | POA: Diagnosis not present

## 2024-09-22 DIAGNOSIS — R4181 Age-related cognitive decline: Secondary | ICD-10-CM | POA: Diagnosis not present

## 2024-09-23 DIAGNOSIS — R4181 Age-related cognitive decline: Secondary | ICD-10-CM | POA: Diagnosis not present

## 2024-09-24 DIAGNOSIS — R4181 Age-related cognitive decline: Secondary | ICD-10-CM | POA: Diagnosis not present

## 2024-09-25 DIAGNOSIS — R4181 Age-related cognitive decline: Secondary | ICD-10-CM | POA: Diagnosis not present

## 2024-09-26 DIAGNOSIS — R4181 Age-related cognitive decline: Secondary | ICD-10-CM | POA: Diagnosis not present

## 2024-09-27 DIAGNOSIS — R4181 Age-related cognitive decline: Secondary | ICD-10-CM | POA: Diagnosis not present

## 2024-09-28 DIAGNOSIS — R4181 Age-related cognitive decline: Secondary | ICD-10-CM | POA: Diagnosis not present

## 2024-09-29 DIAGNOSIS — R4181 Age-related cognitive decline: Secondary | ICD-10-CM | POA: Diagnosis not present

## 2024-09-30 DIAGNOSIS — R4181 Age-related cognitive decline: Secondary | ICD-10-CM | POA: Diagnosis not present

## 2024-10-01 DIAGNOSIS — R4181 Age-related cognitive decline: Secondary | ICD-10-CM | POA: Diagnosis not present

## 2024-10-02 DIAGNOSIS — R4181 Age-related cognitive decline: Secondary | ICD-10-CM | POA: Diagnosis not present

## 2024-10-03 DIAGNOSIS — R4181 Age-related cognitive decline: Secondary | ICD-10-CM | POA: Diagnosis not present

## 2024-10-04 DIAGNOSIS — R4181 Age-related cognitive decline: Secondary | ICD-10-CM | POA: Diagnosis not present

## 2024-10-05 DIAGNOSIS — R4181 Age-related cognitive decline: Secondary | ICD-10-CM | POA: Diagnosis not present

## 2024-10-06 DIAGNOSIS — R4181 Age-related cognitive decline: Secondary | ICD-10-CM | POA: Diagnosis not present

## 2024-10-07 DIAGNOSIS — R4181 Age-related cognitive decline: Secondary | ICD-10-CM | POA: Diagnosis not present

## 2024-10-08 DIAGNOSIS — R4181 Age-related cognitive decline: Secondary | ICD-10-CM | POA: Diagnosis not present

## 2024-10-09 DIAGNOSIS — R4181 Age-related cognitive decline: Secondary | ICD-10-CM | POA: Diagnosis not present

## 2024-10-10 DIAGNOSIS — R4181 Age-related cognitive decline: Secondary | ICD-10-CM | POA: Diagnosis not present

## 2024-10-11 DIAGNOSIS — R4181 Age-related cognitive decline: Secondary | ICD-10-CM | POA: Diagnosis not present

## 2024-10-12 DIAGNOSIS — R4181 Age-related cognitive decline: Secondary | ICD-10-CM | POA: Diagnosis not present

## 2024-10-13 DIAGNOSIS — R430 Anosmia: Secondary | ICD-10-CM | POA: Diagnosis not present

## 2024-10-13 DIAGNOSIS — L659 Nonscarring hair loss, unspecified: Secondary | ICD-10-CM | POA: Diagnosis not present

## 2024-10-13 DIAGNOSIS — G4700 Insomnia, unspecified: Secondary | ICD-10-CM | POA: Diagnosis not present

## 2024-10-13 DIAGNOSIS — R110 Nausea: Secondary | ICD-10-CM | POA: Diagnosis not present

## 2024-10-13 DIAGNOSIS — L299 Pruritus, unspecified: Secondary | ICD-10-CM | POA: Diagnosis not present

## 2024-10-13 DIAGNOSIS — I10 Essential (primary) hypertension: Secondary | ICD-10-CM | POA: Diagnosis not present

## 2024-10-13 DIAGNOSIS — R4181 Age-related cognitive decline: Secondary | ICD-10-CM | POA: Diagnosis not present

## 2024-10-14 DIAGNOSIS — R4181 Age-related cognitive decline: Secondary | ICD-10-CM | POA: Diagnosis not present

## 2024-10-15 DIAGNOSIS — R4181 Age-related cognitive decline: Secondary | ICD-10-CM | POA: Diagnosis not present

## 2024-10-16 DIAGNOSIS — R4181 Age-related cognitive decline: Secondary | ICD-10-CM | POA: Diagnosis not present

## 2024-10-17 DIAGNOSIS — R4181 Age-related cognitive decline: Secondary | ICD-10-CM | POA: Diagnosis not present

## 2024-10-18 DIAGNOSIS — R4181 Age-related cognitive decline: Secondary | ICD-10-CM | POA: Diagnosis not present

## 2024-10-19 DIAGNOSIS — R4181 Age-related cognitive decline: Secondary | ICD-10-CM | POA: Diagnosis not present

## 2024-10-20 DIAGNOSIS — R4181 Age-related cognitive decline: Secondary | ICD-10-CM | POA: Diagnosis not present

## 2024-10-21 DIAGNOSIS — R4181 Age-related cognitive decline: Secondary | ICD-10-CM | POA: Diagnosis not present

## 2024-10-22 DIAGNOSIS — R4181 Age-related cognitive decline: Secondary | ICD-10-CM | POA: Diagnosis not present

## 2024-10-23 DIAGNOSIS — R4181 Age-related cognitive decline: Secondary | ICD-10-CM | POA: Diagnosis not present

## 2024-10-24 DIAGNOSIS — R4181 Age-related cognitive decline: Secondary | ICD-10-CM | POA: Diagnosis not present

## 2024-10-25 DIAGNOSIS — R4181 Age-related cognitive decline: Secondary | ICD-10-CM | POA: Diagnosis not present

## 2024-10-26 DIAGNOSIS — R4181 Age-related cognitive decline: Secondary | ICD-10-CM | POA: Diagnosis not present

## 2024-10-27 DIAGNOSIS — R4181 Age-related cognitive decline: Secondary | ICD-10-CM | POA: Diagnosis not present

## 2024-10-28 ENCOUNTER — Encounter (HOSPITAL_COMMUNITY): Payer: Self-pay

## 2024-10-28 ENCOUNTER — Emergency Department (HOSPITAL_COMMUNITY)

## 2024-10-28 ENCOUNTER — Other Ambulatory Visit: Payer: Self-pay

## 2024-10-28 ENCOUNTER — Emergency Department (HOSPITAL_COMMUNITY)
Admission: EM | Admit: 2024-10-28 | Discharge: 2024-10-28 | Disposition: A | Discharge status: Home / Self Care | Attending: Emergency Medicine | Admitting: Emergency Medicine

## 2024-10-28 DIAGNOSIS — K5900 Constipation, unspecified: Secondary | ICD-10-CM | POA: Insufficient documentation

## 2024-10-28 DIAGNOSIS — R4181 Age-related cognitive decline: Secondary | ICD-10-CM | POA: Diagnosis not present

## 2024-10-28 DIAGNOSIS — R1084 Generalized abdominal pain: Secondary | ICD-10-CM | POA: Diagnosis not present

## 2024-10-28 DIAGNOSIS — Z79899 Other long term (current) drug therapy: Secondary | ICD-10-CM | POA: Diagnosis not present

## 2024-10-28 DIAGNOSIS — I10 Essential (primary) hypertension: Secondary | ICD-10-CM | POA: Insufficient documentation

## 2024-10-28 LAB — URINALYSIS, ROUTINE W REFLEX MICROSCOPIC
Bilirubin Urine: NEGATIVE
Glucose, UA: NEGATIVE mg/dL
Hgb urine dipstick: NEGATIVE
Ketones, ur: NEGATIVE mg/dL
Nitrite: NEGATIVE
Protein, ur: NEGATIVE mg/dL
Specific Gravity, Urine: <1.005 — ABNORMAL LOW (ref 1.005–1.030)
pH: 5.5 (ref 5.0–8.0)

## 2024-10-28 LAB — CBC WITH DIFFERENTIAL/PLATELET
Abs Immature Granulocytes: 0.02 K/uL (ref 0.00–0.07)
Basophils Absolute: 0 K/uL (ref 0.0–0.1)
Basophils Relative: 0 %
Eosinophils Absolute: 0.1 K/uL (ref 0.0–0.5)
Eosinophils Relative: 2 %
HCT: 38.5 % (ref 36.0–46.0)
Hemoglobin: 13.3 g/dL (ref 12.0–15.0)
Immature Granulocytes: 0 %
Lymphocytes Relative: 28 %
Lymphs Abs: 1.9 K/uL (ref 0.7–4.0)
MCH: 31.1 pg (ref 26.0–34.0)
MCHC: 34.5 g/dL (ref 30.0–36.0)
MCV: 90 fL (ref 80.0–100.0)
Monocytes Absolute: 0.4 K/uL (ref 0.1–1.0)
Monocytes Relative: 6 %
Neutro Abs: 4.3 K/uL (ref 1.7–7.7)
Neutrophils Relative %: 64 %
Platelets: 258 K/uL (ref 150–400)
RBC: 4.28 MIL/uL (ref 3.87–5.11)
RDW: 13.4 % (ref 11.5–15.5)
WBC: 6.8 K/uL (ref 4.0–10.5)
nRBC: 0 % (ref 0.0–0.2)

## 2024-10-28 LAB — URINALYSIS, MICROSCOPIC (REFLEX)
Bacteria, UA: NONE SEEN
RBC / HPF: NONE SEEN RBC/hpf (ref 0–5)

## 2024-10-28 LAB — COMPREHENSIVE METABOLIC PANEL WITH GFR
ALT: 13 U/L (ref 0–44)
AST: 33 U/L (ref 15–41)
Albumin: 4.2 g/dL (ref 3.5–5.0)
Alkaline Phosphatase: 90 U/L (ref 38–126)
Anion gap: 9 (ref 5–15)
BUN: 6 mg/dL (ref 6–20)
CO2: 24 mmol/L (ref 22–32)
Calcium: 9.1 mg/dL (ref 8.9–10.3)
Chloride: 106 mmol/L (ref 98–111)
Creatinine, Ser: 0.83 mg/dL (ref 0.44–1.00)
GFR, Estimated: >60 mL/min (ref >60)
Glucose, Bld: 85 mg/dL (ref 70–99)
Potassium: 4.2 mmol/L (ref 3.5–5.1)
Sodium: 139 mmol/L (ref 135–145)
Total Bilirubin: 0.7 mg/dL (ref 0.0–1.2)
Total Protein: 6.8 g/dL (ref 6.5–8.1)

## 2024-10-28 LAB — LIPASE, BLOOD: Lipase: 68 U/L — ABNORMAL HIGH (ref 11–51)

## 2024-10-28 MED ORDER — IOHEXOL 350 MG/ML SOLN
75.0000 mL | Freq: Once | INTRAVENOUS | Status: AC | PRN
  Administered 2024-10-28: 13:00:00 75 mL via INTRAVENOUS

## 2024-10-28 MED ORDER — SENNOSIDES-DOCUSATE SODIUM 8.6-50 MG PO TABS: 1.0000 | ORAL_TABLET | Freq: Two times a day (BID) | ORAL | 0 refills | Status: DC

## 2024-10-28 MED FILL — Sennosides-Docusate Sodium Tab 8.6-50 MG: 1.0000 | ORAL | 15 days supply | Qty: 30 | Fill #0 | Status: CN

## 2024-10-28 NOTE — Discharge Instructions (Addendum)
 You were evaluated in the emergency room for abdominal pain with constipation.  Your lab work and imaging did not show any significant abnormality.  A prescription for senna was sent into your pharmacy.  Please take this twice daily in addition to the MiraLAX  twice daily.  Please follow-up with your GI doctor and primary care doctor as scheduled.  If you experience any new or worsening symptoms such as fever, persistent vomiting please return to the emergency room.

## 2024-10-28 NOTE — ED Provider Notes (Signed)
 [place] EMERGENCY DEPARTMENT AT [facility] Provider Note   CSN: [id number] Arrival date & time: 10/28/24  1033     Patient presents with: Abdominal Pain and Constipation   Alice Woodard is a 53 y.o. female history of hypertension, hyperlipidemia, constipation presents with complaints of generalized abdominal pain without any associated nausea, vomiting or diarrhea.  No urinary or vaginal symptoms.  Reports her last bowel movement was 2 days ago.  Does have a history of appendectomy.  No other prior abdominal surgeries.  Has been taking MiraLAX  without significant relief.  States that she has had similar symptoms in the past.    Abdominal Pain Associated symptoms: constipation   Constipation Associated symptoms: abdominal pain    Past Medical History:  Diagnosis Date   Allergy    outdoor, not sure what is allergy   Anemia    History of blood transfusion 04/17/2017   anemic from the bleeding ulcer   Hyperlipidemia    Hypertension    Past Surgical History:  Procedure Laterality Date   APPENDECTOMY     CHOLECYSTECTOMY OPEN     ESOPHAGOGASTRODUODENOSCOPY N/A 04/18/2017   Procedure: ESOPHAGOGASTRODUODENOSCOPY (EGD);  Surgeon: Nandigam, Kavitha V, MD;  Location: [facility] ENDOSCOPY;  Service: Endoscopy;  Laterality: N/A;   UPPER GASTROINTESTINAL ENDOSCOPY  04/2017       Prior to Admission medications  Medication Sig Start Date End Date Taking? Authorizing Provider  alum & mag hydroxide-simeth (MAALOX PLUS) 400-400-40 MG/5ML suspension Take 15 mLs by mouth every 6 (six) hours as needed for indigestion. 08/28/23   Mesner, Selinda, MD  Blood Glucose Monitoring Suppl (ACCU-CHEK GUIDE) w/Device KIT USE DEVICE TO CHECK YOUR BLOOD PRESSURE THREE TO FOUR TIMES A WEEK Patient not taking: Reported on 09/17/2023 09/03/23   [provider]  Blood Pressure KIT by Does not apply route. Patient not taking: Reported on 09/17/2023 09/03/23   [provider]  dicyclomine   (BENTYL ) 10 MG capsule Dicyclomine  10 mg every 8 hours as needed. 09/24/23   Nandigam, Kavitha V, MD  docusate sodium  (COLACE) 100 MG capsule Take 1 capsule (100 mg total) by mouth every 12 (twelve) hours. 08/28/23   Mesner, Selinda, MD  famotidine  (PEPCID ) 20 MG tablet Take 1 tablet (20 mg total) by mouth at bedtime. 11/29/22   Kennedy-Smith, Colleen M, NP  gabapentin  (NEURONTIN ) 300 MG capsule TAKE 1 CAPSULE (300 MG TOTAL) BY MOUTH AT BEDTIME. 09/13/20 09/24/23  Newlin, Enobong, MD  hydrALAZINE (APRESOLINE) 10 MG tablet Take 10 mg by mouth 3 (three) times daily. 05/24/23   [provider]  loratadine  (CLARITIN ) 10 MG tablet Take 1 tablet (10 mg total) by mouth daily. 01/03/24   Keith, Kayla N, PA-C  losartan  (COZAAR ) 100 MG tablet Take 100 mg by mouth. 05/03/23   [provider]  ondansetron  (ZOFRAN ) 4 MG tablet Take 1 tablet (4 mg total) by mouth every 6 (six) hours. Patient not taking: Reported on 09/17/2023 08/26/23   Ula Prentice SAUNDERS, MD  ondansetron  (ZOFRAN -ODT) 4 MG disintegrating tablet Take 1 tablet (4 mg total) by mouth every 8 (eight) hours as needed for nausea or vomiting. Patient not taking: Reported on 09/17/2023 08/30/23   Geiple, Joshua, PA-C  oxyCODONE -acetaminophen  (PERCOCET/ROXICET) 5-325 MG tablet Take 1 tablet by mouth every 6 (six) hours as needed for severe pain (pain score 7-10). 08/30/23   Geiple, Joshua, PA-C  pantoprazole  (PROTONIX ) 40 MG tablet Take 1 tablet (40 mg total) by mouth daily. 09/24/23   Nandigam, Kavitha V, MD  polyethylene  glycol (MIRALAX  / GLYCOLAX ) 17 g packet Take 17 g by mouth daily. 08/28/23   Mesner, Selinda, MD  senna-docusate (SENOKOT-S) 8.6-50 MG tablet Take 1 tablet by mouth 2 (two) times daily. 10/28/24   Donnajean Lynwood DEL, PA-C  sucralfate  (CARAFATE ) 1 g tablet Take 1 tablet (1 g total) by mouth 4 (four) times daily -  with meals and at bedtime. Patient not taking: Reported on 09/17/2023 08/30/23   Geiple, Joshua, PA-C  traZODone  (DESYREL ) 50  MG tablet Take 1 tablet (50 mg total) by mouth at bedtime as needed for sleep. 09/18/22   Newlin, Enobong, MD    Allergies: Amlodipine     Review of Systems  Gastrointestinal:  Positive for abdominal pain and constipation.    Updated Vital Signs BP (!) 142/93 (BP Location: Right Arm)   Pulse 61   Temp 98.1 F (36.7 C)   Resp 16   LMP 11/15/2014   SpO2 98%   Physical Exam Vitals and nursing note reviewed.  Constitutional:      General: She is not in acute distress.    Appearance: She is well-developed.  HENT:     Head: Normocephalic and atraumatic.  Eyes:     Conjunctiva/sclera: Conjunctivae normal.  Cardiovascular:     Rate and Rhythm: Normal rate and regular rhythm.     Heart sounds: No murmur heard. Pulmonary:     Effort: Pulmonary effort is normal. No respiratory distress.     Breath sounds: Normal breath sounds.  Abdominal:     Palpations: Abdomen is soft.     Tenderness: There is abdominal tenderness.     Comments: Mild abdominal tenderness, soft nondistended  Musculoskeletal:        General: No swelling.     Cervical back: Neck supple.  Skin:    General: Skin is warm and dry.     Capillary Refill: Capillary refill takes less than 2 seconds.  Neurological:     Mental Status: She is alert.  Psychiatric:        Mood and Affect: Mood normal.     (all labs ordered are listed, but only abnormal results are displayed) Labs Reviewed  LIPASE, BLOOD - Abnormal; Notable for the following components:      Result Value   Lipase 68 (*)    All other components within normal limits  URINALYSIS, ROUTINE W REFLEX MICROSCOPIC - Abnormal; Notable for the following components:   Specific Gravity, Urine <1.005 (*)    Leukocytes,Ua TRACE (*)    All other components within normal limits  COMPREHENSIVE METABOLIC PANEL WITH GFR  CBC WITH DIFFERENTIAL/PLATELET  URINALYSIS, MICROSCOPIC (REFLEX)    EKG: None  Radiology: CT ABDOMEN PELVIS W CONTRAST Result Date:  10/28/2024 CLINICAL DATA:  Abdominal/flank pain.  Possible renal stone. EXAM: CT ABDOMEN AND PELVIS WITH CONTRAST TECHNIQUE: Multidetector CT imaging of the abdomen and pelvis was performed using the standard protocol following bolus administration of intravenous contrast. RADIATION DOSE REDUCTION: This exam was performed according to the departmental dose-optimization program which includes automated exposure control, adjustment of the mA and/or kV according to patient size and/or use of iterative reconstruction technique. CONTRAST:  75mL OMNIPAQUE  IOHEXOL  350 MG/ML SOLN COMPARISON:  08/26/2023 FINDINGS: Lower chest: Heart is normal size.  Visualized lung bases are clear. Hepatobiliary: Liver and biliary tree are normal. Prior cholecystectomy. Pancreas: Normal. Spleen: Normal. Adrenals/Urinary Tract: Adrenal glands are normal. Kidneys are normal size without hydronephrosis or nephrolithiasis. Ureters and bladder are normal. Stomach/Bowel: Stomach and small bowel are normal.  Appendix is normal. Colon is normal. Vascular/Lymphatic: Minimal calcified plaque over the abdominal aorta which is normal in caliber. Remaining vascular structures are unremarkable. No evidence of adenopathy. Reproductive: Uterus and bilateral adnexa are unremarkable. Other: No free fluid or focal inflammatory change. Musculoskeletal: No focal abnormality. IMPRESSION: 1. No acute findings in the abdomen/pelvis. 2. Aortic atherosclerosis. Aortic Atherosclerosis (ICD10-I70.0). Electronically Signed   By: Toribio Agreste M.D.   On: 10/28/2024 13:55     Procedures   Medications Ordered in the ED  iohexol  (OMNIPAQUE ) 350 MG/ML injection 75 mL (75 mLs Intravenous Contrast Given 10/28/24 1319)    Clinical Course as of 10/28/24 1625  Wed Oct 28, 2024  1622 Patient evaluated for 2 days of constipation with generalized abdominal pain.  Has had no vomiting, diarrhea, urinary or vaginal symptoms.  Upon arrival patient is hemodynamically stable  and nontoxic-appearing.  She has mild generalized abdominal tenderness.  Her lab work is overall reassuring.  CT scan is without any acute pathology.  She does have some notable stool burden.  Will send in a prescription for senna in addition to her MiraLAX .  She has an upcoming appointment with GI and PCP.  Strict return precautions provided.  Family and patient are understanding and agreement with plan. [JT]    Clinical Course User Index [JT] Donnajean Lynwood DEL, PA-C                                 Medical Decision Making  This patient presents to the ED with chief complaint(s) of Abdominal.  The complaint involves an extensive differential diagnosis and also carries with it a high risk of complications and morbidity.   Pertinent past medical history as listed in HPI  The differential diagnosis includes  Based off exam and history do not suspect acute abdominal pathology such as SBO, AAA, cholecystitis, abdominal perforation, cystitis Additional history obtained: Additional history obtained from family Records reviewed Care Everywhere/External Records  Disposition:   Patient will be discharged home. The patient has been appropriately medically screened and/or stabilized in the ED. I have low suspicion for any other emergent medical condition which would require further screening, evaluation or treatment in the ED or require inpatient management. At time of discharge the patient is hemodynamically stable and in no acute distress. I have discussed work-up results and diagnosis with patient and answered all questions. Patient is agreeable with discharge plan. We discussed strict return precautions for returning to the emergency department and they verbalized understanding.     Social Determinants of Health:   none  This note was dictated with voice recognition software.  Despite best efforts at proofreading, errors may have occurred which can change the documentation meaning.       Final  diagnoses:  Generalized abdominal pain  Constipation, unspecified constipation type    ED Discharge Orders          Ordered    senna-docusate (SENOKOT-S) 8.6-50 MG tablet  2 times daily,   Status:  Discontinued        10/28/24 1624    senna-docusate (SENOKOT-S) 8.6-50 MG tablet  2 times daily        10/28/24 1625               Donnajean Lynwood DEL, PA-C 10/28/24 1625    Franklyn Sid SAILOR, MD 10/28/24 [phone]

## 2024-10-28 NOTE — ED Provider Triage Note (Signed)
 Emergency Medicine Provider Triage Evaluation Note  Alice Woodard , a 53 y.o. female  was evaluated in triage.  Pt complains of abdominal pain, constipation, nausea.  Review of Systems  Positive: Abdominal pain nausea constipation Negative: Chest pain, diarrhea, shortness of breath, dizziness  Physical Exam  BP (!) 163/90 (BP Location: Left Arm)   Pulse (!) 55   Resp 18   LMP 11/15/2014   SpO2 100%  Gen:   Awake, no distress   Resp:  Normal effort  MSK:   Moves extremities without difficulty  Other:    Medical Decision Making  Medically screening exam initiated at 11:30 AM.  Appropriate orders placed.  Alice Woodard was informed that the remainder of the evaluation will be completed by another provider, this initial triage assessment does not replace that evaluation, and the importance of remaining in the ED until their evaluation is complete.  Patient has reported extensive history of abdominal issues including severe constipation and colitis requiring hospitalization.  Basic lab work and CT scan will be ordered for evaluation.   Myriam Fonda RAMAN, [place] 10/29/24 [phone]

## 2024-10-28 NOTE — ED Triage Notes (Signed)
 Pt has not had BM in 2 days, has not been regular for a week, has been drinking Miralax  with no relief. Pt having abdominal pain and loss of appetite since.

## 2024-10-29 DIAGNOSIS — R4181 Age-related cognitive decline: Secondary | ICD-10-CM | POA: Diagnosis not present

## 2024-10-30 DIAGNOSIS — R4181 Age-related cognitive decline: Secondary | ICD-10-CM | POA: Diagnosis not present

## 2024-10-31 DIAGNOSIS — R4181 Age-related cognitive decline: Secondary | ICD-10-CM | POA: Diagnosis not present

## 2024-11-01 DIAGNOSIS — R4181 Age-related cognitive decline: Secondary | ICD-10-CM | POA: Diagnosis not present

## 2024-11-02 DIAGNOSIS — R4181 Age-related cognitive decline: Secondary | ICD-10-CM | POA: Diagnosis not present

## 2024-11-03 DIAGNOSIS — R4181 Age-related cognitive decline: Secondary | ICD-10-CM | POA: Diagnosis not present

## 2024-11-04 DIAGNOSIS — R4181 Age-related cognitive decline: Secondary | ICD-10-CM | POA: Diagnosis not present

## 2024-11-06 DIAGNOSIS — R4181 Age-related cognitive decline: Secondary | ICD-10-CM | POA: Diagnosis not present

## 2024-11-07 DIAGNOSIS — R4181 Age-related cognitive decline: Secondary | ICD-10-CM | POA: Diagnosis not present

## 2024-11-08 DIAGNOSIS — R4181 Age-related cognitive decline: Secondary | ICD-10-CM | POA: Diagnosis not present

## 2024-11-24 ENCOUNTER — Ambulatory Visit (INDEPENDENT_AMBULATORY_CARE_PROVIDER_SITE_OTHER): Admitting: Gastroenterology

## 2024-11-24 ENCOUNTER — Encounter: Payer: Self-pay | Admitting: Gastroenterology

## 2024-11-24 VITALS — BP 140/86 | HR 56 | Ht 60.0 in | Wt 113.2 lb

## 2024-11-24 DIAGNOSIS — K5900 Constipation, unspecified: Secondary | ICD-10-CM

## 2024-11-24 DIAGNOSIS — K219 Gastro-esophageal reflux disease without esophagitis: Secondary | ICD-10-CM | POA: Diagnosis not present

## 2024-11-24 DIAGNOSIS — K5909 Other constipation: Secondary | ICD-10-CM | POA: Diagnosis not present

## 2024-11-24 DIAGNOSIS — G8929 Other chronic pain: Secondary | ICD-10-CM

## 2024-11-24 DIAGNOSIS — R110 Nausea: Secondary | ICD-10-CM | POA: Diagnosis not present

## 2024-11-24 DIAGNOSIS — K2970 Gastritis, unspecified, without bleeding: Secondary | ICD-10-CM | POA: Diagnosis not present

## 2024-11-24 DIAGNOSIS — R1013 Epigastric pain: Secondary | ICD-10-CM

## 2024-11-24 MED ORDER — SENNOSIDES-DOCUSATE SODIUM 8.6-50 MG PO TABS: 1.0000 | ORAL_TABLET | Freq: Two times a day (BID) | ORAL | 1 refills | Status: AC

## 2024-11-24 MED ORDER — PANTOPRAZOLE SODIUM 20 MG PO TBEC: 20.0000 mg | DELAYED_RELEASE_TABLET | Freq: Every day | ORAL | 3 refills | Status: AC

## 2024-11-24 NOTE — Patient Instructions (Signed)
 We have given you samples of the following medication to take: Linzess 72 mcg and 145 mcg  We have sent the following medications to your pharmacy for you to pick up at your convenience: Protonix  20 mg    We have sent in 1 refill on senna  _______________________________________________________  If your blood pressure at your visit was 140/90 or greater, please contact your primary care physician to follow up on this.  _______________________________________________________  If you are age 56 or older, your body mass index should be between 23-30. Your Body mass index is 22.11 kg/m. If this is out of the aforementioned range listed, please consider follow up with your Primary Care Provider.  If you are age 3 or younger, your body mass index should be between 19-25. Your Body mass index is 22.11 kg/m. If this is out of the aformentioned range listed, please consider follow up with your Primary Care Provider.   ________________________________________________________  The  GI providers would like to encourage you to use MYCHART to communicate with providers for non-urgent requests or questions.  Due to long hold times on the telephone, sending your provider a message by [facility] may be a faster and more efficient way to get a response.  Please allow 48 business hours for a response.  Please remember that this is for non-urgent requests.  _______________________________________________________  [facility] is using a team-based approach to care.  Your team is made up of your doctor and two to three APPS. Our APPS (Nurse Practitioners and Physician Assistants) work with your physician to ensure care continuity for you. They are fully qualified to address your health concerns and develop a treatment plan. They communicate directly with your gastroenterologist to care for you. Seeing the Advanced Practice Practitioners on your physician's team can help you by facilitating care  more promptly, often allowing for earlier appointments, access to diagnostic testing, procedures, and other specialty referrals.

## 2024-11-24 NOTE — Progress Notes (Signed)
 "  Alice Woodard [id number] [birth year]   Chief Complaint: Abdominal pain, constipation  Referring Provider: [facility] Primary GI MD: Dr. Shila  HPI: Alice Woodard is a 54 y.o. female with past medical history of HLD, HTN, constipation, vitamin B12 deficiency, hepatic steatosis, H. pylori duodenal ulcers in 2018, anemia cholecystectomy, appendectomy who presents today for a complaint of abdominal pain and nausea.    Patient last seen in office 08/01/2023 by Harlene Mail, PA-C for complaint of generalized chronic abdominal pain.  Plan at that time was to proceed with CT scan.  It was thought that scar tissue/adhesions possibly contributing to her pain.  CT A/P 08/09/2023 with no findings to explain abdominal pain.  She was scheduled for repeat EGD.  CT A/P 08/26/2023 showed no acute findings.  Underwent repeat EGD 09/24/2023 with finding of gastritis and otherwise normal with no H. pylori identified.  Patient seen in the ED 10/28/2024 for complaint of generalized abdominal pain with no associated nausea, vomiting, or diarrhea.  Endorsed constipation, no bowel movement in 2 days.  Lab work overall reassuring.  CT scan without any acute pathology.  Does have a notable stool burden.  She was sent in a prescription for senna in addition to her MiraLAX .  Advised to follow-up with GI PCP.    Discussed the use of AI scribe software for clinical note transcription with the patient, who gave verbal consent to proceed.  History of Present Illness Alice Woodard is a 54 year old female with chronic constipation who presents for follow-up of abdominal pain. She is here today with her daughter-in-law who speaks some English, as well as an interpreter who assists with the visit.  Abdominal Pain and Discomfort: - Persistent upper abdominal pain, improved since last visit but not resolved - Abdominal discomfort and upset stomach occur with absence of bowel movement - Symptoms worsen  during cold weather - Currently feels some abdominal discomfort  - No blood in stool or dark black stools  Constipation: - Chronic constipation with regular use of Senokot providing effective relief; currently out of Senokot - MiraLAX  previously caused stomach upset and bloating, so she is not using it - Bowel movements typically occur every morning with medication, but she did not have one today - Absence of bowel movement leads to abdominal discomfort - Stool characteristics during constipation include hard, small, ball-shaped stools - Treatment for constipation in ER led to bowel movement and symptomatic improvement  Gastroesophageal Reflux and Heartburn: - Heartburn and acid reflux occur, particularly during cold weather or when constipated - Previously took a reflux medication prescribed at this clinic, but is not currently taking it and is unsure of the name - Reflux symptoms improve with regular bowel movements - No difficulty swallowing or sensation of food getting stuck  Nausea and Vomiting: - Intermittent nausea and occasional vomiting - Prior use of a nausea medication prescribed by her primary care doctor provided relief; currently uses as needed - Nausea tends to recur after a few months without medication  Gastric Inflammation and Ulcer History: - Upper endoscopy in 2024 showed gastritis - History of duodenal ulcers found on endoscopy a few years prior    Previous GI Procedures/Imaging   CT A/P 10/28/2024 IMPRESSION: 1. No acute findings in the abdomen/pelvis. 2. Aortic atherosclerosis.  EGD 09/24/2023 - Z- line regular, 38 cm from the incisors.  - No gross lesions in the entire esophagus.  - Gastritis. Biopsied.  - Normal examined duodenum. Path: 1. Surgical [P], gastric  bx's :       -  ANTRAL MUCOSA WITH FEATURES OF BOTH MILD CHRONIC INACTIVE GASTRITIS AND       CHEMICAL/REACTIVE CHANGE.       -  OXYNTIC MUCOSA WITH NO SIGNIFICANT PATHOLOGY.       -  NO  HELICOBACTER PYLORI ORGANISMS IDENTIFIED ON H&E STAINED SLIDE.   EGD 07/27/2021: - Z-line regular, 35 cm from the incisors. - No gross lesions in esophagus. - Erythematous mucosa in the antrum and prepyloric region of the stomach. Biopsied. - Non-bleeding gastric ulcers with no stigmata of bleeding. Biopsied. - Duodenal erosions without bleeding. Biopsied. - Recall EGD 3 years   1. Surgical [P], duodenal - PEPTIC DUODENITIS. - NO DYSPLASIA OR MALIGNANCY. 2. 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 - WARTHIN-STARRY IS NEGATIVE FOR HELICOBACTER PYLORI. - NO DYSPLASIA OR MALIGNANCY.  Colonoscopy 05/20/2019 - Two 4 to 6 mm polyps in the sigmoid colon and in the cecum, removed with a cold snare. Resected and retrieved.  - Three 1 to 2 mm polyps in the rectum, removed with a cold biopsy forceps. Resected and retrieved.  - Non- bleeding internal hemorrhoids. - Recall 7 years Path: 1. Surgical [P], cecum, polyp - TUBULAR ADENOMA(S). - NO HIGH GRADE DYSPLASIA OR MALIGNANCY. 2. Surgical [P], sigmoid and rectum, polyp (4) - HYPERPLASTIC POLYP(S). - NO ADENOMATOUS CHANGE OR MALIGNANCY.  Past Medical History:  Diagnosis Date   Allergy    outdoor, not sure what is allergy   Anemia    History of blood transfusion 04/17/2017   anemic from the bleeding ulcer   Hyperlipidemia    Hypertension     Past Surgical History:  Procedure Laterality Date   APPENDECTOMY     CHOLECYSTECTOMY OPEN     ESOPHAGOGASTRODUODENOSCOPY N/A 04/18/2017   Procedure: ESOPHAGOGASTRODUODENOSCOPY (EGD);  Surgeon: Nandigam, Kavitha V, MD;  Location: [facility] ENDOSCOPY;  Service: Endoscopy;  Laterality: N/A;   UPPER GASTROINTESTINAL ENDOSCOPY  04/2017    Current Outpatient Medications  Medication Sig Dispense Refill   alum & mag hydroxide-simeth (MAALOX PLUS) 400-400-40 MG/5ML suspension Take 15 mLs by mouth every 6 (six) hours as needed for indigestion. 355 mL 0   Blood Glucose  Monitoring Suppl (ACCU-CHEK GUIDE) w/Device KIT USE DEVICE TO CHECK YOUR BLOOD PRESSURE THREE TO FOUR TIMES A WEEK     Blood Pressure KIT by Does not apply route.     dicyclomine  (BENTYL ) 10 MG capsule Dicyclomine  10 mg every 8 hours as needed. 30 capsule 3   docusate sodium  (COLACE) 100 MG capsule Take 1 capsule (100 mg total) by mouth every 12 (twelve) hours. 60 capsule 0   famotidine  (PEPCID ) 20 MG tablet Take 1 tablet (20 mg total) by mouth at bedtime. 30 tablet 1   gabapentin  (NEURONTIN ) 300 MG capsule TAKE 1 CAPSULE (300 MG TOTAL) BY MOUTH AT BEDTIME. 60 capsule 6   hydrALAZINE (APRESOLINE) 10 MG tablet Take 10 mg by mouth 3 (three) times daily.     loratadine  (CLARITIN ) 10 MG tablet Take 1 tablet (10 mg total) by mouth daily. 30 tablet 2   losartan  (COZAAR ) 100 MG tablet Take 100 mg by mouth.     ondansetron  (ZOFRAN ) 4 MG tablet Take 1 tablet (4 mg total) by mouth every 6 (six) hours. 12 tablet 0   ondansetron  (ZOFRAN -ODT) 4 MG disintegrating tablet Take 1 tablet (4 mg total) by mouth every 8 (eight) hours as needed for nausea or vomiting. 10 tablet 0   oxyCODONE -acetaminophen  (  PERCOCET/ROXICET) 5-325 MG tablet Take 1 tablet by mouth every 6 (six) hours as needed for severe pain (pain score 7-10). 10 tablet 0   pantoprazole  (PROTONIX ) 40 MG tablet Take 1 tablet (40 mg total) by mouth daily. 90 tablet 3   polyethylene glycol (MIRALAX  / GLYCOLAX ) 17 g packet Take 17 g by mouth daily. 14 each 0   senna-docusate (SENOKOT-S) 8.6-50 MG tablet Take 1 tablet by mouth 2 (two) times daily. 30 tablet 0   sucralfate  (CARAFATE ) 1 g tablet Take 1 tablet (1 g total) by mouth 4 (four) times daily -  with meals and at bedtime. 60 tablet 1   traZODone  (DESYREL ) 50 MG tablet Take 1 tablet (50 mg total) by mouth at bedtime as needed for sleep. 30 tablet 3   No current facility-administered medications for this visit.    Allergies as of 11/24/2024 - Review Complete 11/24/2024  Allergen Reaction Noted    Amlodipine  Rash 04/17/2017    Family History  Problem Relation Age of Onset   Cancer - Other Mother        blood   Colon cancer Neg Hx    Esophageal cancer Neg Hx    Rectal cancer Neg Hx    Stomach cancer Neg Hx     Social History[1]   Review of Systems:    Constitutional: No weight loss, fever, chills Cardiovascular: No chest pain Respiratory: No SOB  Gastrointestinal: See HPI and otherwise negative   Physical Exam:  Vital signs: BP (!) 140/86   Pulse (!) 56   Ht 5' (1.524 m)   Wt 113 lb 3.2 oz (51.3 kg)   LMP 11/15/2014   BMI 22.11 kg/m   Wt Readings from Last 3 Encounters:  11/24/24 113 lb 3.2 oz (51.3 kg)  06/24/24 112 lb 14 oz (51.2 kg)  09/24/23 112 lb 12.8 oz (51.2 kg)     Constitutional: Pleasant, well-appearing female in NAD, alert and cooperative Head:  Normocephalic and atraumatic.  Respiratory: Respirations even and unlabored. Lungs clear to auscultation bilaterally.  No wheezes, crackles, or rhonchi.  Cardiovascular:  Regular rate and rhythm. No murmurs. No peripheral edema. Gastrointestinal:  Soft, nondistended, minimal tenderness to palpation of epigastrium. No rebound or guarding. Normal bowel sounds. No appreciable masses or hepatomegaly. Rectal:  Not performed.  Neurologic:  Alert and oriented x4;  grossly normal neurologically.  Skin:   Dry and intact without significant lesions or rashes. Psychiatric: Oriented to person, place and time. Demonstrates good judgement and reason without abnormal affect or behaviors.   Assessment/Plan:   Assessment & Plan Chronic constipation Chronic constipation with suboptimal response to polyethylene glycol and effective use of senna.  Currently out of Senokot, was prescribed in the ED.  Workup in the ED reassuring.  Requesting refill today.  We also discussed trial of Linzess and will give her samples today, send prescription if effective.  No alarm features at this time.  Last colonoscopy 2020 with 7-year recall  recommended.  - Provided Linzess samples 72 mcg and 145 mcg, instructed to start at lower dose, titrate as needed.  Call for prescription. - Refilled senna for backup use if Linzess ineffective  - Schedule follow-up in 8 weeks to reassess. - If symptoms persist or worsen, consider endoscopic evaluation  Gastritis and gastroesophageal reflux disease Chronic epigastric pain Nausea Chronic epigastric pain and GERD, with gastritis seen on EGD in 2024 and duodenal ulceration seen on EGD in 2022.  Patient experiencing mild epigastric discomfort and intermittent reflux, exacerbated  by constipation and cold weather, improved with regular bowel movements.  Workup in the ED reassuring.  No alarm features at this time.  - Start Protonix  20 mg daily - Deferred endoscopy; reassess need at follow-up based on response. - Arranged follow-up in 8 weeks. - If symptoms persist at follow-up, consider endoscopic evaluation    Camie Furbish, PA-C [facility] 11/24/2024, 8:52 AM  Patient Care Team: [facility] as PCP - General       [1]  Social History Tobacco Use   Smoking status: Never   Smokeless tobacco: Never  Vaping Use   Vaping status: Never Used  Substance Use Topics   Alcohol use: Never   Drug use: Never   "

## 2024-12-10 ENCOUNTER — Telehealth: Payer: Self-pay | Admitting: Gastroenterology

## 2024-12-10 NOTE — Telephone Encounter (Signed)
 Inbound call from patients daughter stating linzess has been working and patient would like to know if a prescription can be sent to the pharmacy. Please advise  Thank you

## 2024-12-15 ENCOUNTER — Other Ambulatory Visit: Payer: Self-pay | Admitting: Emergency Medicine

## 2024-12-15 MED ORDER — LINACLOTIDE 72 MCG PO CAPS: 72.0000 ug | ORAL_CAPSULE | Freq: Every day | ORAL | 3 refills | Status: AC

## 2025-01-29 ENCOUNTER — Ambulatory Visit: Admitting: Gastroenterology
